# Patient Record
Sex: Female | Born: 1945 | Race: White | Hispanic: No | Smoking: Former smoker
Health system: Southern US, Community
[De-identification: ages and names within clinical notes are randomized; demographics above are authoritative.]

## PROBLEM LIST (undated history)

## (undated) DIAGNOSIS — R748 Abnormal levels of other serum enzymes: Secondary | ICD-10-CM

## (undated) DIAGNOSIS — Z5189 Encounter for other specified aftercare: Secondary | ICD-10-CM

## (undated) DIAGNOSIS — F329 Major depressive disorder, single episode, unspecified: Secondary | ICD-10-CM

## (undated) DIAGNOSIS — T7840XA Allergy, unspecified, initial encounter: Secondary | ICD-10-CM

## (undated) DIAGNOSIS — F32A Depression, unspecified: Secondary | ICD-10-CM

## (undated) DIAGNOSIS — I10 Essential (primary) hypertension: Secondary | ICD-10-CM

## (undated) DIAGNOSIS — G4730 Sleep apnea, unspecified: Secondary | ICD-10-CM

## (undated) DIAGNOSIS — K5790 Diverticulosis of intestine, part unspecified, without perforation or abscess without bleeding: Secondary | ICD-10-CM

## (undated) DIAGNOSIS — F1011 Alcohol abuse, in remission: Secondary | ICD-10-CM

## (undated) DIAGNOSIS — K219 Gastro-esophageal reflux disease without esophagitis: Secondary | ICD-10-CM

## (undated) DIAGNOSIS — E785 Hyperlipidemia, unspecified: Secondary | ICD-10-CM

## (undated) DIAGNOSIS — F1911 Other psychoactive substance abuse, in remission: Secondary | ICD-10-CM

## (undated) DIAGNOSIS — F319 Bipolar disorder, unspecified: Secondary | ICD-10-CM

## (undated) DIAGNOSIS — R0600 Dyspnea, unspecified: Secondary | ICD-10-CM

## (undated) DIAGNOSIS — K760 Fatty (change of) liver, not elsewhere classified: Secondary | ICD-10-CM

## (undated) DIAGNOSIS — G629 Polyneuropathy, unspecified: Secondary | ICD-10-CM

## (undated) HISTORY — DX: Diverticulosis of intestine, part unspecified, without perforation or abscess without bleeding: K57.90

## (undated) HISTORY — PX: SKIN GRAFT: SHX250

## (undated) HISTORY — DX: Hyperlipidemia, unspecified: E78.5

## (undated) HISTORY — DX: Dyspnea, unspecified: R06.00

## (undated) HISTORY — DX: Encounter for other specified aftercare: Z51.89

## (undated) HISTORY — DX: Fatty (change of) liver, not elsewhere classified: K76.0

## (undated) HISTORY — DX: Depression, unspecified: F32.A

## (undated) HISTORY — PX: TUBAL LIGATION: SHX77

## (undated) HISTORY — PX: OTHER SURGICAL HISTORY: SHX169

## (undated) HISTORY — DX: Major depressive disorder, single episode, unspecified: F32.9

## (undated) HISTORY — PX: BREAST LUMPECTOMY: SHX2

## (undated) HISTORY — DX: Other psychoactive substance abuse, in remission: F19.11

## (undated) HISTORY — PX: COLONOSCOPY: SHX174

## (undated) HISTORY — DX: Bipolar disorder, unspecified: F31.9

## (undated) HISTORY — DX: Abnormal levels of other serum enzymes: R74.8

## (undated) HISTORY — DX: Essential (primary) hypertension: I10

## (undated) HISTORY — DX: Alcohol abuse, in remission: F10.11

## (undated) HISTORY — DX: Gastro-esophageal reflux disease without esophagitis: K21.9

## (undated) HISTORY — DX: Sleep apnea, unspecified: G47.30

## (undated) HISTORY — DX: Allergy, unspecified, initial encounter: T78.40XA

## (undated) HISTORY — DX: Polyneuropathy, unspecified: G62.9

---

## 1998-04-28 ENCOUNTER — Encounter: Payer: Self-pay | Admitting: Family Medicine

## 1998-04-28 ENCOUNTER — Ambulatory Visit (HOSPITAL_COMMUNITY): Admission: RE | Admit: 1998-04-28 | Discharge: 1998-04-28 | Payer: Self-pay | Admitting: Family Medicine

## 1998-08-26 ENCOUNTER — Ambulatory Visit (HOSPITAL_COMMUNITY): Admission: RE | Admit: 1998-08-26 | Discharge: 1998-08-26 | Payer: Self-pay | Admitting: Gastroenterology

## 1998-12-08 ENCOUNTER — Inpatient Hospital Stay (HOSPITAL_COMMUNITY): Admission: EM | Admit: 1998-12-08 | Discharge: 1998-12-13 | Payer: Self-pay | Admitting: Emergency Medicine

## 1998-12-10 ENCOUNTER — Encounter: Payer: Self-pay | Admitting: Psychiatry

## 1998-12-14 ENCOUNTER — Other Ambulatory Visit: Admission: RE | Admit: 1998-12-14 | Discharge: 1998-12-29 | Payer: Self-pay | Admitting: Psychiatry

## 2003-05-21 ENCOUNTER — Ambulatory Visit (HOSPITAL_COMMUNITY): Admission: RE | Admit: 2003-05-21 | Discharge: 2003-05-21 | Payer: Self-pay | Admitting: Family Medicine

## 2003-07-18 ENCOUNTER — Ambulatory Visit (HOSPITAL_COMMUNITY): Admission: RE | Admit: 2003-07-18 | Discharge: 2003-07-18 | Payer: Self-pay | Admitting: Family Medicine

## 2006-09-21 ENCOUNTER — Ambulatory Visit: Payer: Self-pay | Admitting: Family Medicine

## 2006-09-28 ENCOUNTER — Ambulatory Visit (HOSPITAL_COMMUNITY): Admission: RE | Admit: 2006-09-28 | Discharge: 2006-09-28 | Payer: Self-pay | Admitting: Nurse Practitioner

## 2006-10-05 ENCOUNTER — Encounter: Admission: RE | Admit: 2006-10-05 | Discharge: 2006-10-05 | Payer: Self-pay | Admitting: Family Medicine

## 2006-10-19 ENCOUNTER — Ambulatory Visit: Payer: Self-pay | Admitting: Family Medicine

## 2006-10-19 ENCOUNTER — Encounter (INDEPENDENT_AMBULATORY_CARE_PROVIDER_SITE_OTHER): Payer: Self-pay | Admitting: Nurse Practitioner

## 2006-10-19 LAB — CONVERTED CEMR LAB
Cholesterol: 281 mg/dL — ABNORMAL HIGH (ref 0–200)
HDL: 61 mg/dL (ref >39)
LDL Cholesterol: 180 mg/dL — ABNORMAL HIGH (ref 0–99)
Total CHOL/HDL Ratio: 4.6
Triglycerides: 198 mg/dL — ABNORMAL HIGH (ref <150)
VLDL: 40 mg/dL (ref 0–40)

## 2007-04-03 ENCOUNTER — Ambulatory Visit: Payer: Self-pay | Admitting: Nurse Practitioner

## 2007-04-03 LAB — CONVERTED CEMR LAB
ALT: 23 units/L (ref 0–35)
AST: 21 units/L (ref 0–37)
Alkaline Phosphatase: 80 units/L (ref 39–117)
BUN: 18 mg/dL (ref 6–23)
Chloride: 99 meq/L (ref 96–112)
Creatinine, Ser: 0.92 mg/dL (ref 0.40–1.20)
HDL: 66 mg/dL (ref >39)
LDL Cholesterol: 175 mg/dL — ABNORMAL HIGH (ref 0–99)
Total Bilirubin: 0.5 mg/dL (ref 0.3–1.2)
Total CHOL/HDL Ratio: 4.1
VLDL: 28 mg/dL (ref 0–40)

## 2007-04-04 ENCOUNTER — Ambulatory Visit: Payer: Self-pay | Admitting: *Deleted

## 2007-04-24 ENCOUNTER — Ambulatory Visit: Payer: Self-pay | Admitting: Internal Medicine

## 2007-05-15 ENCOUNTER — Encounter (INDEPENDENT_AMBULATORY_CARE_PROVIDER_SITE_OTHER): Payer: Self-pay | Admitting: Family Medicine

## 2007-05-15 ENCOUNTER — Ambulatory Visit (HOSPITAL_COMMUNITY): Admission: RE | Admit: 2007-05-15 | Discharge: 2007-05-15 | Payer: Self-pay | Admitting: Family Medicine

## 2008-04-10 ENCOUNTER — Encounter: Payer: Self-pay | Admitting: Internal Medicine

## 2008-05-21 ENCOUNTER — Encounter: Payer: Self-pay | Admitting: Internal Medicine

## 2008-09-17 ENCOUNTER — Encounter: Payer: Self-pay | Admitting: Internal Medicine

## 2008-09-26 ENCOUNTER — Ambulatory Visit: Payer: Self-pay | Admitting: Pulmonary Disease

## 2008-09-26 ENCOUNTER — Encounter: Payer: Self-pay | Admitting: Internal Medicine

## 2008-09-26 DIAGNOSIS — R0602 Shortness of breath: Secondary | ICD-10-CM | POA: Insufficient documentation

## 2008-10-03 ENCOUNTER — Encounter: Payer: Self-pay | Admitting: Internal Medicine

## 2008-10-03 LAB — CONVERTED CEMR LAB: Pap Smear: NORMAL

## 2009-08-15 ENCOUNTER — Encounter: Payer: Self-pay | Admitting: Internal Medicine

## 2009-09-09 ENCOUNTER — Encounter: Payer: Self-pay | Admitting: Internal Medicine

## 2009-11-24 ENCOUNTER — Telehealth: Payer: Self-pay | Admitting: Internal Medicine

## 2009-11-24 ENCOUNTER — Ambulatory Visit: Payer: Self-pay | Admitting: Internal Medicine

## 2009-11-24 DIAGNOSIS — F329 Major depressive disorder, single episode, unspecified: Secondary | ICD-10-CM

## 2009-11-24 DIAGNOSIS — F32A Depression, unspecified: Secondary | ICD-10-CM | POA: Insufficient documentation

## 2009-11-24 DIAGNOSIS — I10 Essential (primary) hypertension: Secondary | ICD-10-CM | POA: Insufficient documentation

## 2009-11-24 DIAGNOSIS — F1910 Other psychoactive substance abuse, uncomplicated: Secondary | ICD-10-CM | POA: Insufficient documentation

## 2009-11-26 ENCOUNTER — Telehealth: Payer: Self-pay | Admitting: Family Medicine

## 2009-11-27 ENCOUNTER — Telehealth (INDEPENDENT_AMBULATORY_CARE_PROVIDER_SITE_OTHER): Payer: Self-pay | Admitting: *Deleted

## 2009-12-02 ENCOUNTER — Encounter: Payer: Self-pay | Admitting: Family Medicine

## 2009-12-06 ENCOUNTER — Telehealth: Payer: Self-pay | Admitting: Internal Medicine

## 2009-12-14 ENCOUNTER — Telehealth: Payer: Self-pay | Admitting: Internal Medicine

## 2009-12-22 ENCOUNTER — Ambulatory Visit: Payer: Self-pay | Admitting: Internal Medicine

## 2009-12-22 DIAGNOSIS — R748 Abnormal levels of other serum enzymes: Secondary | ICD-10-CM | POA: Insufficient documentation

## 2009-12-22 DIAGNOSIS — G4733 Obstructive sleep apnea (adult) (pediatric): Secondary | ICD-10-CM | POA: Insufficient documentation

## 2009-12-22 DIAGNOSIS — E785 Hyperlipidemia, unspecified: Secondary | ICD-10-CM | POA: Insufficient documentation

## 2009-12-24 ENCOUNTER — Encounter: Payer: Self-pay | Admitting: Internal Medicine

## 2010-02-11 ENCOUNTER — Ambulatory Visit: Payer: Self-pay | Admitting: Internal Medicine

## 2010-02-11 DIAGNOSIS — K219 Gastro-esophageal reflux disease without esophagitis: Secondary | ICD-10-CM | POA: Insufficient documentation

## 2010-02-11 DIAGNOSIS — R197 Diarrhea, unspecified: Secondary | ICD-10-CM | POA: Insufficient documentation

## 2010-02-12 ENCOUNTER — Encounter: Payer: Self-pay | Admitting: Gastroenterology

## 2010-02-15 ENCOUNTER — Telehealth (INDEPENDENT_AMBULATORY_CARE_PROVIDER_SITE_OTHER): Payer: Self-pay | Admitting: *Deleted

## 2010-02-15 LAB — CONVERTED CEMR LAB
ALT: 33 units/L (ref 0–35)
AST: 33 units/L (ref 0–37)
Albumin: 4.3 g/dL (ref 3.5–5.2)
Alkaline Phosphatase: 84 units/L (ref 39–117)
Basophils Relative: 0.3 % (ref 0.0–3.0)
Bilirubin, Direct: 0.1 mg/dL (ref 0.0–0.3)
CO2: 28 meq/L (ref 19–32)
Calcium: 9.9 mg/dL (ref 8.4–10.5)
Creatinine, Ser: 0.9 mg/dL (ref 0.4–1.2)
Eosinophils Absolute: 0.3 10*3/uL (ref 0.0–0.7)
Eosinophils Relative: 4.4 % (ref 0.0–5.0)
GFR calc non Af Amer: 67.99 mL/min (ref >60.00)
HCT: 40.3 % (ref 36.0–46.0)
HDL: 53.4 mg/dL (ref >39.00)
Lymphs Abs: 3.1 10*3/uL (ref 0.7–4.0)
MCHC: 33.5 g/dL (ref 30.0–36.0)
MCV: 85.1 fL (ref 78.0–100.0)
Monocytes Absolute: 0.4 10*3/uL (ref 0.1–1.0)
RBC: 4.73 M/uL (ref 3.87–5.11)
Sodium: 141 meq/L (ref 135–145)
Total Protein: 7.4 g/dL (ref 6.0–8.3)
Triglycerides: 140 mg/dL (ref 0.0–149.0)
WBC: 7.4 10*3/uL (ref 4.5–10.5)

## 2010-02-16 ENCOUNTER — Encounter (INDEPENDENT_AMBULATORY_CARE_PROVIDER_SITE_OTHER): Payer: Self-pay | Admitting: *Deleted

## 2010-02-16 ENCOUNTER — Telehealth: Payer: Self-pay | Admitting: Internal Medicine

## 2010-02-16 ENCOUNTER — Encounter: Payer: Self-pay | Admitting: Gastroenterology

## 2010-02-16 ENCOUNTER — Ambulatory Visit: Payer: Self-pay | Admitting: Gastroenterology

## 2010-02-16 DIAGNOSIS — K602 Anal fissure, unspecified: Secondary | ICD-10-CM | POA: Insufficient documentation

## 2010-02-16 DIAGNOSIS — K589 Irritable bowel syndrome without diarrhea: Secondary | ICD-10-CM | POA: Insufficient documentation

## 2010-02-16 DIAGNOSIS — L290 Pruritus ani: Secondary | ICD-10-CM | POA: Insufficient documentation

## 2010-02-16 DIAGNOSIS — K5900 Constipation, unspecified: Secondary | ICD-10-CM | POA: Insufficient documentation

## 2010-02-16 DIAGNOSIS — R109 Unspecified abdominal pain: Secondary | ICD-10-CM | POA: Insufficient documentation

## 2010-02-16 DIAGNOSIS — F1011 Alcohol abuse, in remission: Secondary | ICD-10-CM | POA: Insufficient documentation

## 2010-02-18 ENCOUNTER — Ambulatory Visit (HOSPITAL_COMMUNITY)
Admission: RE | Admit: 2010-02-18 | Discharge: 2010-02-18 | Payer: Self-pay | Source: Home / Self Care | Attending: Gastroenterology | Admitting: Gastroenterology

## 2010-02-18 DIAGNOSIS — K8020 Calculus of gallbladder without cholecystitis without obstruction: Secondary | ICD-10-CM | POA: Insufficient documentation

## 2010-02-18 DIAGNOSIS — K7689 Other specified diseases of liver: Secondary | ICD-10-CM | POA: Insufficient documentation

## 2010-02-23 ENCOUNTER — Telehealth: Payer: Self-pay | Admitting: Gastroenterology

## 2010-03-19 ENCOUNTER — Telehealth: Payer: Self-pay | Admitting: Internal Medicine

## 2010-03-26 ENCOUNTER — Telehealth: Payer: Self-pay | Admitting: Gastroenterology

## 2010-03-28 ENCOUNTER — Encounter: Payer: Self-pay | Admitting: Family Medicine

## 2010-04-04 LAB — CONVERTED CEMR LAB
ALT: 61 units/L
AST: 43 units/L
HDL: 53 mg/dL
Sodium, serum: 139 mmol/L
WBC, blood: 8.3 10*3/uL

## 2010-04-06 NOTE — Miscellaneous (Signed)
Summary: labs from [facility]  Clinical Lists Changes  Observations: Added new observation of PAP SMEAR: normal (10/03/2008 14:54) Added new observation of LDL: 204 mg/dL (04/54/0981 19:14) Added new observation of HDL: 53 mg/dL (78/29/5621 30:86) Added new observation of SGPT (ALT): 61 units/L (09/26/2008 14:49) Added new observation of SGOT (AST): 43 units/L (09/26/2008 14:49) Added new observation of CREATININE: 1.00 mg/dL (57/84/6962 95:28) Added new observation of POTASSIUM: 3.8 mmol/L (09/26/2008 14:49) Added new observation of SODIUM: 139 mmol/L (09/26/2008 14:49) Added new observation of PLATELETS: 312 10*3/mm3 (05/21/2008 14:49) Added new observation of HGB: 13.3 g/dL (41/32/4401 02:72) Added new observation of WBC: 8.3 10*3/mm3 (05/21/2008 14:49)      Preventive Care Screening  Pap Smear:    Date:  10/03/2008    Results:  normal

## 2010-04-06 NOTE — Progress Notes (Signed)
Summary: checking on patient  Phone Note Outgoing Call   Summary of Call:  placed check on the patient --did she  stay on Celexa or switched  to Lexapro? --does  she has an appointment to see psychiatry? Jose E. Paz MD  December 06, 2009 11:55 AM   Follow-up for Phone Call        Pt is taking is Lexapro. She has not made an appt with psych. She will make one ASAP. But she states she is doing well.  Follow-up by: Army Fossa CMA,  December 07, 2009 9:08 AM

## 2010-04-06 NOTE — Progress Notes (Signed)
Summary: prior [facility]  Phone Note Refill Request Message from:  Fax from Pharmacy on November 27, 2009 10:07 AM  Refills Requested: Medication #1:  LEXAPRO 10 MG TABS 1 by mouth daily.. prior Berkley Harvey - [id number] - per ins use citalopram - fluvoxamine  Initial call taken by: Okey Regal [place],  November 27, 2009 10:10 AM  Follow-up for Phone Call        Can we change to one of the preferred meds listed or try prior authorization anyway? Please advise.  Follow-up by: Lucious Groves CMA,  November 27, 2009 10:59 AM  Additional Follow-up for Phone Call Additional follow up Details #1::        we can change to celexa but only if pt is ok with it---ask pt first Additional Follow-up by: Loreen Freud DO,  November 27, 2009 11:11 AM    Additional Follow-up for Phone Call Additional follow up Details #2::    Left message on machine to call back to office. Lucious Groves CMA  November 27, 2009 12:17 PM   PATIENT CALLED BACK--SHE HAS BEEN TAKING CELEXA DUE TO THE COST OF LEXAPRO, BUT SHE SAYS DR PAZ WANTED HER TO TAKE LEXAPRO  SHE CAN NOW AFFORD THE LEXAPRO---SO SHE WANTS TO STOP THE CELEXA AND START TAKING LEXAPRO.Marland KitchenMarland KitchenJerolyn Shin  November 27, 2009 2:26 PM  per dr Drue Novel phone note on 11-24-09 pt is on citalopram. No change for now. However med was changed per phone 11-26-09 by dr Beverely Low although reading new to est OV lexapro was added to med list but later verify that it needed to be celexa per previous rx. left message to call office to inform pt................Marland KitchenFelecia Deloach CMA  November 27, 2009 2:53 PM  Pt return call left message to call office .............Marland KitchenFelecia Deloach CMA  December 02, 2009 10:29 AM   pt called back stating that celexa was working for her but it is no longer helping her pt would like to change to lexapro. Pt has tried to follow meds prozac, paxil, celexa. prior auth in process CASE #[id number]...........Marland KitchenFelecia Deloach CMA  December 02, 2009 10:35 AM     Additional Follow-up for Phone Call Additional follow up Details #3:: Details for Additional Follow-up Action Taken: prior auth approved 11-11-2009 until 03-06-2098, pt notified, approval letter scan to chart, faxed [facility] [address] [place].................Marland KitchenFelecia Deloach CMA  December 03, 2009 8:12 AM

## 2010-04-06 NOTE — Miscellaneous (Signed)
Summary: Vaccine Records  Vaccine Records   Imported By: Lanelle Bal 12/25/2009 10:58:47  _____________________________________________________________________  External Attachment:    Type:   Image     Comment:   External Document  Appended Document: Vaccine Records enter  tetanus shot in EMR  Appended Document: Immunization Entry     Immunization History:  Tetanus/Td Immunization History:    Tetanus/Td:  historical (11/10/2009)

## 2010-04-06 NOTE — Letter (Signed)
Summary: Generic Letter  [facility] [place]  [address] [place], [place] [address]   Phone: [phone]  Fax: [phone]    12/22/2009  REF: Tammy Stevens [address] [place], [place]  [address]    To Whom It May Concern    [name]  is a patient of mine, she has a history of sleep apnea and is benefit from using a CPAP.    Sincerely,      [name] MD

## 2010-04-06 NOTE — Progress Notes (Signed)
Summary: depression med (lmom 10/11)   Phone Note Call from Patient Call back at [facility] [place] [facility] Phone [phone]   Summary of Call: Patient notes that she has been taking the Lexapro 2 by mouth once daily and is still very depressed. She would like to know if she should add Citalopram 40mg  that she has at home or do you have other advisements, please advise. Initial call taken by: Lucious Groves CMA,  December 14, 2009 4:36 PM  Follow-up for Phone Call        she needs to stay on Lexapro and see psychiatry ASAP. If suicidal ideas, go to the ER Follow-up by: Jose E. Paz MD,  December 14, 2009 5:03 PM  Additional Follow-up for Phone Call Additional follow up Details #1::        Patient notified and states that she does not have the company-pay to see psychiatry right now. She would like to know why you want her to see them and what is expected to be accomplished. Please advise. Additional Follow-up by: Lucious Groves CMA,  December 15, 2009 2:07 PM    Additional Follow-up for Phone Call Additional follow up Details #2::    patient reportedly has a long  history of psychiatric issues, the goals are for her depression to be well controlled and to that end, she really needs to see a specialist. Jose E. Paz MD  December 15, 2009 2:28 PM   Additional Follow-up for Phone Call Additional follow up Details #3:: Details for Additional Follow-up Action Taken: Left message for pt to call back. Army Fossa CMA  December 15, 2009 3:56 PM  Patient notified. Lucious Groves CMA  December 16, 2009 10:50 AM    New/Updated Medications: LEXAPRO 20 MG TABS (ESCITALOPRAM OXALATE) 1 by mouth qd Prescriptions: LEXAPRO 20 MG TABS (ESCITALOPRAM OXALATE) 1 by mouth qd  #30 x 1   Entered by:   Lucious Groves CMA   Authorized by:   Nolon Rod. Paz MD   Signed by:   Lucious Groves CMA on 12/16/2009   Method used:   Electronically to        [facility] [address] (retail)       [phone]       [place], [place]  [address]       Ph: [phone]       Fax: [phone]   RxID:   [id number]
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
## 2010-04-06 NOTE — Medication Information (Signed)
Summary: Prior Authorization & Approval for Lexapro/[facility]  Prior Authorization & Approval for Lexapro/[facility]   Imported By: Lanelle Bal 12/09/2009 08:09:34  _____________________________________________________________________  External Attachment:    Type:   Image     Comment:   External Document

## 2010-04-06 NOTE — Letter (Signed)
Summary: New Patient letter  [facility]  [address] [place], [place] [address]   Phone: [phone]  Fax: [phone]       02/12/2010 MRN: [id number]  [facility] [address] [place], [place]  [address]  Dear Ms. Morea,  Welcome to the Gastroenterology Division at [facility].    You are scheduled to see Dr.  Jarold Motto   on 02-16-10 at 10am on the 3rd floor at [facility], [address]  We ask that you try to arrive at our office 15 minutes prior to your appointment time to allow for check-in.  We would like you to complete the enclosed self-administered evaluation form prior to your visit and bring it with you on the day of your appointment.  We will review it with you.  Also, please bring a complete list of all your medications or, if you prefer, bring the medication bottles and we will list them.  Please bring your insurance card so that we may make a copy of it.  If your insurance requires a referral to see a specialist, please bring your referral form from your primary care physician.  Co-payments are due at the time of your visit and may be paid by cash, check or credit card.     Your office visit will consist of a consult with your physician (includes a physical exam), any laboratory testing he/she may order, scheduling of any necessary diagnostic testing (e.g. x-ray, ultrasound, CT-scan), and scheduling of a procedure (e.g. Endoscopy, Colonoscopy) if required.  Please allow enough time on your schedule to allow for any/all of these possibilities.    If you cannot keep your appointment, please call [phone] to cancel or reschedule prior to your appointment date.  This allows [place] the opportunity to schedule an appointment for another patient in need of care.  If you do not cancel or reschedule by 5 p.m. the business day prior to your appointment date, you will be charged a $50.00 late cancellation/no-show fee.    Thank you for choosing  [facility] for your medical needs.  We appreciate the opportunity to care for you.  Please visit [place] at our website  to learn more about our practice.                     Sincerely,                                                             The Gastroenterology Division

## 2010-04-06 NOTE — Letter (Signed)
Summary: [facility]    Imported By: Lanelle Bal 12/29/2009 12:55:23  _____________________________________________________________________  External Attachment:    Type:   Image     Comment:   External Document
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
## 2010-04-06 NOTE — Progress Notes (Signed)
Summary: Med Change?  Phone Note Call from Patient   Caller: Patient Summary of Call: Pt called stating that she is currently on Celaxa 40 mg. She states that Dr.Paz wants her to switch to Lexapro. I reviewed pts OV and do not see anything noted about switching to Lexapro. The last phone states Celexa was verified and there should be no change for now. Please advise on if she can switch meds. I informed pt that Dr.Paz was currently out of the office but if possible pt would like an answer now. Army Fossa CMA  November 26, 2009 11:31 AM   Follow-up for Phone Call        lexapro and celexa are related medications- lexapro is not generic and will be considerably more expensive.  i see in pt's OV she was previously on Lexapro 10mg .  ok to switch if she would like, #30, 1 refill. Follow-up by: Neena Rhymes MD,  November 26, 2009 11:41 AM  Additional Follow-up for Phone Call Additional follow up Details #1::        Spoke with pt she would like to change to Lexapro. Army Fossa CMA  November 26, 2009 12:46 PM     New/Updated Medications: LEXAPRO 10 MG TABS (ESCITALOPRAM OXALATE) 1 by mouth daily. Prescriptions: LEXAPRO 10 MG TABS (ESCITALOPRAM OXALATE) 1 by mouth daily.  #30 x 1   Entered by:   Army Fossa CMA   Authorized by:   Neena Rhymes MD   Signed by:   Army Fossa CMA on 11/26/2009   Method used:   Electronically to        [facility] [address] (retail)       [phone]       [place], [place]  [address]       Ph: [phone]       Fax: [phone]   RxID:   [id number]

## 2010-04-06 NOTE — Progress Notes (Signed)
  Phone Note Outgoing Call   Summary of Call: advise patient, we clarified her medication, she is on citalopram. No change for now [place] E. Desarae Placide MD  November 24, 2009 4:39 PM   Follow-up for Phone Call        Pt is aware. Army Fossa CMA  November 24, 2009 4:52 PM

## 2010-04-07 ENCOUNTER — Encounter: Payer: Self-pay | Admitting: Gastroenterology

## 2010-04-07 ENCOUNTER — Ambulatory Visit: Admit: 2010-04-07 | Payer: Self-pay | Admitting: Gastroenterology

## 2010-04-08 NOTE — Letter (Signed)
Summary: [facility] Instructions  [facility]  [address] [place], [place] [address]   Phone: [phone]  Fax: [phone]       TABITHIA STRODER    1945/03/08    MRN: [id number]        Procedure Day /Date: Monday 03/15/2010     Arrival Time: 2:30pm     Procedure Time: 3:30pm     Location of Procedure:                    X  [facility] (4th Floor)                  PREPARATION FOR COLONOSCOPY WITH MOVIPREP   Starting 5 days prior to your procedure 03/10/2010 do not eat nuts, seeds, popcorn, corn, beans, peas,  salads, or any raw vegetables.  Do not take any fiber supplements (e.g. Metamucil, Citrucel, and Benefiber).  THE DAY BEFORE YOUR PROCEDURE         Sunday 03/14/2010  1.  Drink clear liquids the entire day-NO SOLID FOOD  2.  Do not drink anything colored red or purple.  Avoid juices with pulp.  No orange juice.  3.  Drink at least 64 oz. (8 glasses) of fluid/clear liquids during the day to prevent dehydration and help the prep work efficiently.  CLEAR LIQUIDS INCLUDE: Water Jello Ice Popsicles Tea (sugar ok, no milk/cream) Powdered fruit flavored drinks Coffee (sugar ok, no milk/cream) Gatorade Juice: apple, white grape, white cranberry  Lemonade Clear bullion, consomm, broth Carbonated beverages (any kind) Strained chicken noodle soup Hard Candy                             4.  In the morning, mix first dose of MoviPrep solution:    Empty 1 Pouch A and 1 Pouch B into the disposable container    Add lukewarm drinking water to the top line of the container. Mix to dissolve    Refrigerate (mixed solution should be used within 24 hrs)  5.  Begin drinking the prep at 5:00 p.m. The MoviPrep container is divided by 4 marks.   Every 15 minutes drink the solution down to the next mark (approximately 8 oz) until the full liter is complete.   6.  Follow completed prep with 16 oz of clear liquid of your choice (Nothing red or purple).   Continue to drink clear liquids until bedtime.  7.  Before going to bed, mix second dose of MoviPrep solution:    Empty 1 Pouch A and 1 Pouch B into the disposable container    Add lukewarm drinking water to the top line of the container. Mix to dissolve    Refrigerate  THE DAY OF YOUR PROCEDURE      Monday 03/15/2010  Beginning at 10:30am (5 hours before procedure):         1. Every 15 minutes, drink the solution down to the next mark (approx 8 oz) until the full liter is complete.  2. Follow completed prep with 16 oz. of clear liquid of your choice.    3. You may drink clear liquids until 1:30pm (2 HOURS BEFORE PROCEDURE).   MEDICATION INSTRUCTIONS  Unless otherwise instructed, you should take regular prescription medications with a small sip of water   as early as possible the morning of your procedure.         OTHER INSTRUCTIONS  You will need  a responsible adult at least 64 years of age to accompany you and drive you home.   This person must remain in the waiting room during your procedure.  Wear loose fitting clothing that is easily removed.  Leave jewelry and other valuables at home.  However, you may wish to bring a book to read or  an iPod/MP3 player to listen to music as you wait for your procedure to start.  Remove all body piercing jewelry and leave at home.  Total time from sign-in until discharge is approximately 2-3 hours.  You should go home directly after your procedure and rest.  You can resume normal activities the  day after your procedure.  The day of your procedure you should not:   Drive   Make legal decisions   Operate machinery   Drink alcohol   Return to work  You will receive specific instructions about eating, activities and medications before you leave.    The above instructions have been reviewed and explained to me by   _______________________    I fully understand and can verbalize these instructions  _____________________________ Date _________

## 2010-04-08 NOTE — Progress Notes (Signed)
Summary: Refill--[facility]  Phone Note Refill Request   Refills Requested: Medication #1:  HYDROCHLOROTHIAZIDE 12.5 MG CAPS 1 by mouth daily. Patient notes that she now would like this prescription from [facility] since he has taken over as her PCP.  Initial call taken by: Lucious Groves CMA,  March 19, 2010 10:51 AM    Prescriptions: HYDROCHLOROTHIAZIDE 12.5 MG CAPS (HYDROCHLOROTHIAZIDE) 1 by mouth daily.  #90 x 1   Entered by:   Lucious Groves CMA   Authorized by:   Nolon Rod. Paz MD   Signed by:   Lucious Groves CMA on 03/19/2010   Method used:   Electronically to        [facility] [address] (retail)       [phone]       [place], [place]  [address]       Ph: [phone]       Fax: [phone]   RxID:   [id number]

## 2010-04-08 NOTE — Progress Notes (Signed)
Summary: prep ?s  Phone Note Call from Patient Call back at Work Phone [phone]   Caller: Patient Call For: Dr. Jarold Motto Reason for Call: Talk to Nurse Summary of Call: pt needs to speak with a nurse about her prep instruction Initial call taken by: [place] Johnson,  March 26, 2010 8:13 AM  Follow-up for Phone Call        went over instructions with the patient and she veralized understanding. Follow-up by: Harlow Mares CMA Duncan Dull),  March 26, 2010 8:33 AM
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
## 2010-04-08 NOTE — Progress Notes (Signed)
Summary: Gluten Allergy  Phone Note Outgoing Call   Call placed by: Lamona Curl CMA Duncan Dull),  February 23, 2010 3:07 PM Call placed to: Patient Summary of Call: Per Dr Jarold Motto, patient is borderline for wheat allergy (see labs from 02/16/10). He would like her to try a gluten free diet for several weeks. I have sent her a diet in the mail and have asked her to call us back with an update in several weeks time to see how she is doing. She verbalizes understanding of all of the above. Initial call taken by: Lamona Curl CMA Duncan Dull),  February 23, 2010 3:08 PM

## 2010-04-08 NOTE — Progress Notes (Signed)
Summary: psych numbers  Phone Note Call from Patient Call back at Home Phone [phone]   Caller: Patient Summary of Call: Pt called this am and wanted to know the numbers to a psych. She is going to call and make an appt. I gave pt numbers to ones around the community. (the ones that were in psych referral) Initial call taken by: Army Fossa CMA,  February 15, 2010 8:10 AM

## 2010-04-08 NOTE — Progress Notes (Signed)
Summary: Gluten allergy testing  Phone Note Outgoing Call   Summary of Call: Returned call to patient, I do not see where she has been tested for gluten allergy.  Patient notes that is fine, she had done earlier today. Lucious Groves CMA  February 16, 2010 12:09 PM

## 2010-04-16 ENCOUNTER — Ambulatory Visit: Payer: BC Managed Care – PPO | Admitting: Internal Medicine

## 2010-04-27 ENCOUNTER — Encounter: Payer: Self-pay | Admitting: Internal Medicine

## 2010-04-27 ENCOUNTER — Other Ambulatory Visit: Payer: Self-pay | Admitting: Internal Medicine

## 2010-04-27 ENCOUNTER — Ambulatory Visit (INDEPENDENT_AMBULATORY_CARE_PROVIDER_SITE_OTHER): Payer: BC Managed Care – PPO | Admitting: Internal Medicine

## 2010-04-27 DIAGNOSIS — R509 Fever, unspecified: Secondary | ICD-10-CM

## 2010-04-27 DIAGNOSIS — K5900 Constipation, unspecified: Secondary | ICD-10-CM

## 2010-04-27 DIAGNOSIS — F329 Major depressive disorder, single episode, unspecified: Secondary | ICD-10-CM

## 2010-04-27 LAB — SEDIMENTATION RATE: Sed Rate: 21 mm/hr (ref 0–22)

## 2010-04-27 LAB — CONVERTED CEMR LAB
Glucose, Urine, Semiquant: NEGATIVE
Protein, U semiquant: NEGATIVE
WBC Urine, dipstick: NEGATIVE
pH: 6

## 2010-04-27 LAB — CBC WITH DIFFERENTIAL/PLATELET
Basophils Absolute: 0.1 10*3/uL (ref 0.0–0.1)
Basophils Relative: 0.8 % (ref 0.0–3.0)
Eosinophils Absolute: 0.3 10*3/uL (ref 0.0–0.7)
MCHC: 33.2 g/dL (ref 30.0–36.0)
MCV: 84.2 fl (ref 78.0–100.0)
Monocytes Absolute: 0.8 10*3/uL (ref 0.1–1.0)
Neutrophils Relative %: 51.6 % (ref 43.0–77.0)
Platelets: 394 10*3/uL (ref 150.0–400.0)
RDW: 15.7 % — ABNORMAL HIGH (ref 11.5–14.6)
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
## 2010-05-05 ENCOUNTER — Encounter: Payer: Self-pay | Admitting: Gastroenterology

## 2010-09-07 ENCOUNTER — Encounter: Payer: Self-pay | Admitting: Internal Medicine

## 2010-09-07 ENCOUNTER — Ambulatory Visit (INDEPENDENT_AMBULATORY_CARE_PROVIDER_SITE_OTHER): Payer: BC Managed Care – PPO | Admitting: Internal Medicine

## 2010-09-07 VITALS — BP 132/80 | HR 88 | Temp 100.5°F | Wt 179.6 lb

## 2010-09-07 DIAGNOSIS — R059 Cough, unspecified: Secondary | ICD-10-CM

## 2010-09-07 DIAGNOSIS — R05 Cough: Secondary | ICD-10-CM

## 2010-09-07 DIAGNOSIS — J410 Simple chronic bronchitis: Secondary | ICD-10-CM | POA: Insufficient documentation

## 2010-09-07 MED ORDER — DOXYCYCLINE HYCLATE 100 MG PO TABS: 100.0000 mg | ORAL_TABLET | Freq: Two times a day (BID) | ORAL | Status: AC

## 2010-09-07 MED ORDER — BENZONATATE 100 MG PO CAPS: 100.0000 mg | ORAL_CAPSULE | Freq: Three times a day (TID) | ORAL | Status: AC | PRN

## 2010-09-07 NOTE — Patient Instructions (Addendum)
Rest, fluids , tylenol For cough, take Mucinex DM twice a day as needed  If the cough continued, and use Tessalon Perles Take the antibiotic as prescribed.... doxycycline  Call if no better in few days Call anytime if the symptoms are severe, you have high fever, short of breath

## 2010-09-07 NOTE — Assessment & Plan Note (Signed)
3 days history of persistent cough, some fever today, no evidence of acute sinusitis or pneumonia on clinical grounds. Likely she has a viral infection and/or bronchitis. She has a history of substance abuse consequently I will stay away from narcotics for cough suppression Plan: See instructions
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
## 2010-09-15 ENCOUNTER — Other Ambulatory Visit: Payer: Self-pay | Admitting: Internal Medicine

## 2010-09-22 ENCOUNTER — Telehealth: Payer: Self-pay | Admitting: Internal Medicine

## 2010-09-22 NOTE — Telephone Encounter (Signed)
error 

## 2010-09-24 ENCOUNTER — Ambulatory Visit: Payer: BC Managed Care – PPO | Admitting: Internal Medicine

## 2010-10-06 ENCOUNTER — Telehealth: Payer: Self-pay | Admitting: Internal Medicine

## 2010-10-06 NOTE — Telephone Encounter (Signed)
Message copied by Merrie Roof on Wed Oct 06, 2010  9:11 AM ------      Message from: [name] E      Created: Thu Sep 23, 2010  1:37 PM       Ok with me, I don't know of any urgency            ----- Message -----         From: Merrie Roof         Sent: 09/22/2010   5:01 PM           To: Wanda Plump, MD, Merrie Roof            Patient is scheduled to come in for a Pap and Breast exam on       Friday 09/24/2010---had CPX in late 2011 (Sept or Dec)---would like this visit only for this exam with a female doctor.      Is this OK with you for me to contact Dr Laury Axon or Dr Beverely Low??     I explained to the patient that it will be in a few weeks for a rescheduled appointment

## 2010-10-06 NOTE — Telephone Encounter (Signed)
Patient made appt with Dr Beverely Low for Tues 9/18 at 8:30 ---she knows it is for Pap only

## 2010-11-17 ENCOUNTER — Ambulatory Visit: Payer: BC Managed Care – PPO | Admitting: Family Medicine

## 2010-11-23 ENCOUNTER — Ambulatory Visit (INDEPENDENT_AMBULATORY_CARE_PROVIDER_SITE_OTHER): Payer: BC Managed Care – PPO | Admitting: Family Medicine

## 2010-11-23 ENCOUNTER — Other Ambulatory Visit (HOSPITAL_COMMUNITY)
Admission: RE | Admit: 2010-11-23 | Discharge: 2010-11-23 | Disposition: A | Discharge status: Home / Self Care | Payer: BC Managed Care – PPO | Source: Ambulatory Visit | Attending: Family Medicine | Admitting: Family Medicine

## 2010-11-23 VITALS — BP 118/80 | Temp 99.0°F | Ht 66.0 in | Wt 171.8 lb

## 2010-11-23 DIAGNOSIS — Z01419 Encounter for gynecological examination (general) (routine) without abnormal findings: Secondary | ICD-10-CM | POA: Insufficient documentation

## 2010-11-23 DIAGNOSIS — Z1231 Encounter for screening mammogram for malignant neoplasm of breast: Secondary | ICD-10-CM

## 2010-11-23 DIAGNOSIS — Z124 Encounter for screening for malignant neoplasm of cervix: Secondary | ICD-10-CM

## 2010-11-25 NOTE — Progress Notes (Signed)
Quick Note:    Results mailed.  ______

## 2010-11-30 ENCOUNTER — Encounter: Payer: Self-pay | Admitting: Family Medicine

## 2010-11-30 DIAGNOSIS — Z124 Encounter for screening for malignant neoplasm of cervix: Secondary | ICD-10-CM | POA: Insufficient documentation

## 2010-11-30 DIAGNOSIS — Z01419 Encounter for gynecological examination (general) (routine) without abnormal findings: Secondary | ICD-10-CM | POA: Insufficient documentation

## 2010-11-30 NOTE — Assessment & Plan Note (Signed)
Pap collected. 

## 2010-11-30 NOTE — Progress Notes (Signed)
  Subjective:    Patient ID: Tammy Stevens, female    DOB: 13-Oct-1945, 64 y.o.   MRN: [id number]  HPI Here today for pap and breast exam.  No GYN concerns.  Overdue on mammo.   Review of Systems For ROS see HPI     Objective:   Physical Exam  Vitals reviewed. Pulmonary/Chest: Right breast exhibits no inverted nipple, no mass, no nipple discharge, no skin change and no tenderness. Left breast exhibits no inverted nipple, no mass, no nipple discharge, no skin change and no tenderness.  Genitourinary: Rectum normal. No breast swelling, tenderness, discharge or bleeding. No labial fusion. There is no rash, tenderness, lesion or injury on the right labia. There is no rash, tenderness, lesion or injury on the left labia. Uterus is not deviated, not enlarged, not fixed and not tender. Cervix exhibits no motion tenderness, no discharge and no friability. Right adnexum displays no mass, no tenderness and no fullness. Left adnexum displays no mass, no tenderness and no fullness. No erythema, tenderness or bleeding around the vagina. No signs of injury around the vagina. No vaginal discharge found.          Assessment & Plan:

## 2010-11-30 NOTE — Assessment & Plan Note (Signed)
Pt's breast exam and vaginal exam both normal.

## 2010-12-22 ENCOUNTER — Ambulatory Visit
Admission: RE | Admit: 2010-12-22 | Discharge: 2010-12-22 | Disposition: A | Discharge status: Home / Self Care | Payer: BC Managed Care – PPO | Source: Ambulatory Visit | Attending: Family Medicine | Admitting: Family Medicine

## 2010-12-22 DIAGNOSIS — Z1231 Encounter for screening mammogram for malignant neoplasm of breast: Secondary | ICD-10-CM

## 2011-01-07 ENCOUNTER — Ambulatory Visit (INDEPENDENT_AMBULATORY_CARE_PROVIDER_SITE_OTHER): Payer: BC Managed Care – PPO | Admitting: Internal Medicine

## 2011-01-07 ENCOUNTER — Encounter: Payer: Self-pay | Admitting: Internal Medicine

## 2011-01-07 VITALS — BP 132/68 | HR 65 | Temp 98.7°F | Resp 18 | Ht 67.0 in | Wt 172.2 lb

## 2011-01-07 DIAGNOSIS — R51 Headache: Secondary | ICD-10-CM

## 2011-01-07 DIAGNOSIS — R059 Cough, unspecified: Secondary | ICD-10-CM

## 2011-01-07 DIAGNOSIS — R05 Cough: Secondary | ICD-10-CM

## 2011-01-07 DIAGNOSIS — Z0000 Encounter for general adult medical examination without abnormal findings: Secondary | ICD-10-CM | POA: Insufficient documentation

## 2011-01-07 DIAGNOSIS — J329 Chronic sinusitis, unspecified: Secondary | ICD-10-CM

## 2011-01-07 MED ORDER — AMOXICILLIN 500 MG PO CAPS: 1000.0000 mg | ORAL_CAPSULE | Freq: Two times a day (BID) | ORAL | Status: AC

## 2011-01-07 MED ORDER — FLUTICASONE PROPIONATE 50 MCG/ACT NA SUSP: 2.0000 | Freq: Every day | NASAL | Status: DC

## 2011-01-07 NOTE — Assessment & Plan Note (Signed)
Recommend to have a physical exam, she states "I  do my physicals with my gynecologist"
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
## 2011-01-07 NOTE — Patient Instructions (Signed)
Rest, fluids , tylenol If  Cough or nasal discharge  take Mucinex DM twice a day as needed  flonase as prescribed  Take the antibiotic as prescribed ----> Amoxicillin Call if no better in few days Call anytime if the symptoms are severe

## 2011-01-20 ENCOUNTER — Telehealth: Payer: Self-pay | Admitting: Internal Medicine

## 2011-01-25 NOTE — Telephone Encounter (Signed)
error 

## 2011-03-19 ENCOUNTER — Other Ambulatory Visit: Payer: Self-pay | Admitting: Internal Medicine

## 2011-06-26 ENCOUNTER — Other Ambulatory Visit: Payer: Self-pay | Admitting: Internal Medicine

## 2011-06-27 NOTE — Telephone Encounter (Signed)
Refill done.  

## 2011-11-05 ENCOUNTER — Emergency Department (HOSPITAL_COMMUNITY)
Admission: EM | Admit: 2011-11-05 | Discharge: 2011-11-06 | Disposition: A | Discharge status: Home / Self Care | Payer: Medicare Other | Attending: Emergency Medicine | Admitting: Emergency Medicine

## 2011-11-05 ENCOUNTER — Encounter (HOSPITAL_COMMUNITY): Payer: Self-pay | Admitting: *Deleted

## 2011-11-05 ENCOUNTER — Ambulatory Visit (INDEPENDENT_AMBULATORY_CARE_PROVIDER_SITE_OTHER): Payer: Medicare Other | Admitting: Emergency Medicine

## 2011-11-05 VITALS — BP 164/80 | HR 73 | Temp 98.1°F | Resp 18 | Ht 66.0 in | Wt 170.0 lb

## 2011-11-05 DIAGNOSIS — F4310 Post-traumatic stress disorder, unspecified: Secondary | ICD-10-CM

## 2011-11-05 DIAGNOSIS — F32A Depression, unspecified: Secondary | ICD-10-CM

## 2011-11-05 DIAGNOSIS — G4730 Sleep apnea, unspecified: Secondary | ICD-10-CM | POA: Insufficient documentation

## 2011-11-05 DIAGNOSIS — E785 Hyperlipidemia, unspecified: Secondary | ICD-10-CM | POA: Insufficient documentation

## 2011-11-05 DIAGNOSIS — F329 Major depressive disorder, single episode, unspecified: Secondary | ICD-10-CM

## 2011-11-05 DIAGNOSIS — F411 Generalized anxiety disorder: Secondary | ICD-10-CM | POA: Insufficient documentation

## 2011-11-05 DIAGNOSIS — R5383 Other fatigue: Secondary | ICD-10-CM | POA: Insufficient documentation

## 2011-11-05 DIAGNOSIS — F319 Bipolar disorder, unspecified: Secondary | ICD-10-CM | POA: Insufficient documentation

## 2011-11-05 DIAGNOSIS — I10 Essential (primary) hypertension: Secondary | ICD-10-CM | POA: Insufficient documentation

## 2011-11-05 DIAGNOSIS — R5381 Other malaise: Secondary | ICD-10-CM | POA: Insufficient documentation

## 2011-11-05 DIAGNOSIS — R4182 Altered mental status, unspecified: Secondary | ICD-10-CM

## 2011-11-05 LAB — COMPREHENSIVE METABOLIC PANEL
ALT: 21 U/L (ref 0–35)
Albumin: 3.9 g/dL (ref 3.5–5.2)
Alkaline Phosphatase: 113 U/L (ref 39–117)
Calcium: 10.1 mg/dL (ref 8.4–10.5)
GFR calc Af Amer: 77 mL/min — ABNORMAL LOW (ref >90)
Glucose, Bld: 107 mg/dL — ABNORMAL HIGH (ref 70–99)
Potassium: 3.9 mEq/L (ref 3.5–5.1)
Sodium: 141 mEq/L (ref 135–145)
Total Protein: 7.2 g/dL (ref 6.0–8.3)

## 2011-11-05 LAB — RAPID URINE DRUG SCREEN, HOSP PERFORMED
Barbiturates: NOT DETECTED
Benzodiazepines: NOT DETECTED
Cocaine: NOT DETECTED
Opiates: POSITIVE — AB

## 2011-11-05 LAB — CBC WITH DIFFERENTIAL/PLATELET
Basophils Relative: 1 % (ref 0–1)
Eosinophils Absolute: 0.4 10*3/uL (ref 0.0–0.7)
Eosinophils Relative: 4 % (ref 0–5)
MCH: 27.8 pg (ref 26.0–34.0)
MCHC: 32.7 g/dL (ref 30.0–36.0)
MCV: 85.1 fL (ref 78.0–100.0)
Neutrophils Relative %: 57 % (ref 43–77)
Platelets: 335 10*3/uL (ref 150–400)
RDW: 13.7 % (ref 11.5–15.5)

## 2011-11-05 LAB — URINALYSIS, ROUTINE W REFLEX MICROSCOPIC
Bilirubin Urine: NEGATIVE
Glucose, UA: NEGATIVE mg/dL
Ketones, ur: NEGATIVE mg/dL
Nitrite: NEGATIVE
Specific Gravity, Urine: 1.013 (ref 1.005–1.030)
pH: 6.5 (ref 5.0–8.0)

## 2011-11-05 LAB — TROPONIN I: Troponin I: <0.3 ng/mL (ref <0.30)

## 2011-11-05 LAB — URINE MICROSCOPIC-ADD ON

## 2011-11-05 MED ORDER — HYDROCHLOROTHIAZIDE 12.5 MG PO CAPS
12.5000 mg | ORAL_CAPSULE | Freq: Every day | ORAL | Status: DC
  Administered 2011-11-06: 12.5 mg via ORAL
  Filled 2011-11-05: qty 1

## 2011-11-05 MED ORDER — LORAZEPAM 1 MG PO TABS
1.0000 mg | ORAL_TABLET | Freq: Three times a day (TID) | ORAL | Status: DC | PRN
  Administered 2011-11-05 – 2011-11-06 (×2): 1 mg via ORAL
  Filled 2011-11-05: qty 1

## 2011-11-05 MED ORDER — LORAZEPAM 1 MG PO TABS
1.0000 mg | ORAL_TABLET | Freq: Once | ORAL | Status: AC
  Administered 2011-11-05: 1 mg via ORAL

## 2011-11-05 MED ORDER — LAMOTRIGINE 200 MG PO TABS
200.0000 mg | ORAL_TABLET | Freq: Two times a day (BID) | ORAL | Status: DC
  Administered 2011-11-05 – 2011-11-06 (×2): 200 mg via ORAL
  Filled 2011-11-05 (×3): qty 1

## 2011-11-05 MED ORDER — LORAZEPAM 1 MG PO TABS
ORAL_TABLET | ORAL | Status: AC
  Filled 2011-11-05: qty 1

## 2011-11-05 MED ORDER — ACETAMINOPHEN 325 MG PO TABS
650.0000 mg | ORAL_TABLET | Freq: Once | ORAL | Status: AC
  Administered 2011-11-05: 650 mg via ORAL
  Filled 2011-11-05: qty 2

## 2011-11-05 MED ORDER — GABAPENTIN 300 MG PO CAPS
300.0000 mg | ORAL_CAPSULE | Freq: Three times a day (TID) | ORAL | Status: DC
  Administered 2011-11-05 – 2011-11-06 (×3): 300 mg via ORAL
  Filled 2011-11-05 (×5): qty 1

## 2011-11-05 MED ORDER — LORAZEPAM 1 MG PO TABS
1.0000 mg | ORAL_TABLET | Freq: Once | ORAL | Status: DC
  Filled 2011-11-05 (×2): qty 1
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
## 2011-11-05 NOTE — ED Notes (Signed)
Pt reports history of PTSD which was exacerbated last night after looking at animals being shackled online. Pt tearful, anxious and reports generalized weakness and blurred vision

## 2011-11-05 NOTE — ED Notes (Signed)
ACT team speaking with family

## 2011-11-05 NOTE — ED Notes (Signed)
Pt in the bathroom on arrival, sitter w/ pt

## 2011-11-05 NOTE — ED Notes (Signed)
Attempted to collect urine from pt. Again. Pt. Was placed on bedpan but still is unable to void. Pt. Was given of water and will try again in 15 minutes.
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
## 2011-11-05 NOTE — ED Notes (Signed)
Up in hall, ambulating w/o difficulty, sitter w/ pt.
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
## 2011-11-05 NOTE — ED Notes (Signed)
Went to collect labs - pt needed a few minutes for medication to kick in.  Will come back in 15 min.
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
## 2011-11-05 NOTE — ED Notes (Signed)
Patient attempting to give a urine sample. Pt does not want an additional ativan at this time as her anxiety has decreased. Pt reports she will try to give a sample at a later time and does not wish to be catheterized. Pt in no apparent distress with daughter and sitter at bedside.

## 2011-11-05 NOTE — ED Notes (Signed)
Pt. Attempted to void on bedpan. Pt. Said that she could not void at that time. Will assess and try again in one-half hour.

## 2011-11-05 NOTE — ED Notes (Signed)
Bedside commode at bedside, attempting to get urine

## 2011-11-06 MED ORDER — IBUPROFEN 100 MG/5ML PO SUSP: 400.0000 mg | Freq: Once | ORAL | Status: DC

## 2011-11-06 MED ORDER — ACETAMINOPHEN 325 MG PO TABS
650.0000 mg | ORAL_TABLET | Freq: Once | ORAL | Status: AC
  Administered 2011-11-06: 650 mg via ORAL
  Filled 2011-11-06: qty 2

## 2011-11-06 MED ORDER — IBUPROFEN 200 MG PO TABS
400.0000 mg | ORAL_TABLET | Freq: Once | ORAL | Status: AC
Start: 2011-11-06 — End: 2011-11-06
  Administered 2011-11-06: 400 mg via ORAL
  Filled 2011-11-06: qty 2

## 2011-11-06 NOTE — ED Notes (Signed)
GUY:[id number]<QQ> Expected date:<BR> Expected time:<BR> Means of arrival:<BR> Comments:<BR> Closed

## 2011-11-06 NOTE — Progress Notes (Signed)
Nurse to contact Angelica Chessman at [facility] to discuss Lloyd Huger PA concerns last night about pt being evaluated for neurological or stroke related symptoms/issues.  ACT spoke with Dr. Lars Mage last night prior to leaving and MD did not see where pt was having neurological issues at that time.  An order was placed to have pt walk and be observed to determine if pt can perform her ADLs.    AC at [facility] #  [phone]

## 2011-11-06 NOTE — ED Notes (Signed)
Tele psych consult currently occuring.

## 2011-11-06 NOTE — Progress Notes (Signed)
Called telepsych to check on status. Advised there are many consults in and we should expect the call within 1.5 hrs.

## 2011-11-06 NOTE — ED Notes (Signed)
1700 telepsych complete.

## 2011-11-06 NOTE — ED Provider Notes (Signed)
Pt has had telepsych consult who feels she can be discharged. Dr Bing Plume said she seemed improved today from when he had seen her yesterday.   PT is pleasant and has good eyecontact. States she feels much better. States she is going to get back into therapy.   Devoria Albe, MD, Armando Gang   Ward Givens, MD 11/06/11 [phone]

## 2011-11-06 NOTE — Progress Notes (Signed)
Pt having first phone call of the day. 

## 2011-11-06 NOTE — Progress Notes (Signed)
Pt wants update concerning her status. ED MD notified and will come to see her.

## 2011-11-06 NOTE — ED Notes (Signed)
Telepsych fax received and given to ED MD.

## 2011-11-06 NOTE — Progress Notes (Signed)
Pt c/o HA. Pt states she had chronic HA and, "Goody powders are the only thing that help." Advised pt that we do not carry that drug, but I would notifiy ED MD about her HA and request for pain med. ED MD notified and orders given.

## 2011-11-06 NOTE — Progress Notes (Signed)
Tele Psych request form on Dr. Wonda Amis desk ready for him to sign.  MD in with another medically acute pt.  MD will take to nurse treating pt for her to fax or call ACT when form is ready.

## 2011-11-22 ENCOUNTER — Ambulatory Visit (HOSPITAL_COMMUNITY): Admission: RE | Admit: 2011-11-22 | Payer: Medicare Other | Source: Home / Self Care | Admitting: Psychiatry

## 2011-11-24 ENCOUNTER — Telehealth: Payer: Self-pay | Admitting: Internal Medicine

## 2011-11-24 MED ORDER — PNEUMOCOCCAL VAC POLYVALENT 25 MCG/0.5ML IJ INJ: 0.5000 mL | INJECTION | Freq: Once | INTRAMUSCULAR | Status: DC

## 2011-11-24 NOTE — Telephone Encounter (Signed)
RX sent

## 2011-11-24 NOTE — Telephone Encounter (Signed)
wants pneumonia vaccine sent to [facility] on [address] Pt does not normally use this pharmacy but she is using for her vaccines only

## 2011-11-24 NOTE — Telephone Encounter (Signed)
pt is now interested in the shingles vaccine and needed to see if she needed it  Cb# [phone]

## 2011-11-25 MED ORDER — ZOSTER VACCINE LIVE 19400 UNT/0.65ML ~~LOC~~ SOLR: 0.6500 mL | Freq: Once | SUBCUTANEOUS | Status: DC

## 2011-11-25 NOTE — Telephone Encounter (Signed)
I see no contraindication for Zostavax after chart review, Rx printed

## 2011-11-25 NOTE — Telephone Encounter (Signed)
Please advise 

## 2011-12-23 ENCOUNTER — Ambulatory Visit (INDEPENDENT_AMBULATORY_CARE_PROVIDER_SITE_OTHER): Payer: Medicare Other | Admitting: Family Medicine

## 2011-12-23 ENCOUNTER — Encounter: Payer: Self-pay | Admitting: Family Medicine

## 2011-12-23 VITALS — BP 121/80 | HR 69 | Temp 98.3°F | Ht 67.0 in | Wt 165.4 lb

## 2011-12-23 DIAGNOSIS — J329 Chronic sinusitis, unspecified: Secondary | ICD-10-CM

## 2011-12-23 MED ORDER — AMOXICILLIN 875 MG PO TABS: 875.0000 mg | ORAL_TABLET | Freq: Two times a day (BID) | ORAL | Status: DC

## 2011-12-23 NOTE — Progress Notes (Signed)
  Subjective:    Patient ID: Tammy Stevens, female    DOB: May 22, 1945, 66 y.o.   MRN: [id number]  HPI Sinus infxn- pt reports hx of this twice yearly, developed severe HA 5-7 days ago.  + nasal congestion w/out drainage.  + facial pain.  No ear pain but pressure.  No fevers.  Not currently on seasonal allergy meds- previously on Flonase but this caused overdrying.  Minimal cough.   Review of Systems For ROS see HPI     Objective:   Physical Exam  Vitals reviewed. Constitutional: She appears well-developed and well-nourished. No distress.  HENT:  Head: Normocephalic and atraumatic.  Right Ear: Tympanic membrane normal.  Left Ear: Tympanic membrane normal.  Nose: Mucosal edema and rhinorrhea present. Right sinus exhibits maxillary sinus tenderness and frontal sinus tenderness. Left sinus exhibits maxillary sinus tenderness and frontal sinus tenderness.  Mouth/Throat: Uvula is midline and mucous membranes are normal. Posterior oropharyngeal erythema present. No oropharyngeal exudate.  Eyes: Conjunctivae normal and EOM are normal. Pupils are equal, round, and reactive to light.  Neck: Normal range of motion. Neck supple.  Cardiovascular: Normal rate, regular rhythm and normal heart sounds.   Pulmonary/Chest: Effort normal and breath sounds normal. No respiratory distress. She has no wheezes.  Lymphadenopathy:    She has no cervical adenopathy.          Assessment & Plan:

## 2011-12-23 NOTE — Patient Instructions (Addendum)
Start the Amoxicillin twice daily for the infection- take w/ food Add Claritin or Zyrtec daily for the allergy component Drink plenty of fluids REST! Hang in there!!!

## 2011-12-25 DIAGNOSIS — J329 Chronic sinusitis, unspecified: Secondary | ICD-10-CM | POA: Insufficient documentation

## 2011-12-25 NOTE — Assessment & Plan Note (Signed)
New.  Pt's sxs and PE consistent w/ infxn.  Start abx.  Reviewed the impact of untreated allergies on sinusitis.  Start OTC antihistamines.  Reviewed supportive care and red flags that should prompt return.  Pt expressed understanding and is in agreement w/ plan.

## 2012-01-08 ENCOUNTER — Other Ambulatory Visit: Payer: Self-pay | Admitting: Internal Medicine

## 2012-01-11 NOTE — Telephone Encounter (Signed)
Refill done.  

## 2012-01-17 ENCOUNTER — Telehealth: Payer: Self-pay | Admitting: Internal Medicine

## 2012-01-17 MED ORDER — FLUTICASONE PROPIONATE 50 MCG/ACT NA SUSP: 2.0000 | Freq: Every day | NASAL | Status: DC

## 2012-01-17 NOTE — Telephone Encounter (Signed)
pt called this morning saw dr.tabori 10.18.13 still having sinus headaches is requesting a nasal spray be called in to [facility] on [address] cb# [phone]

## 2012-01-17 NOTE — Telephone Encounter (Signed)
Discussed with pt, rx sent to pharmacy.  

## 2012-01-17 NOTE — Telephone Encounter (Signed)
If she has severe symptoms, fever, chills, facial pain: Needs to be seen. If she has mild runny nose, okay to call Flonase 2 sprays in each side of the nose, one bottle, no refills. If not better--->  needs office visit

## 2012-01-17 NOTE — Telephone Encounter (Signed)
Please advise 

## 2012-03-02 ENCOUNTER — Encounter: Payer: Self-pay | Admitting: Family Medicine

## 2012-03-23 ENCOUNTER — Other Ambulatory Visit: Payer: Self-pay | Admitting: Internal Medicine

## 2012-03-23 NOTE — Telephone Encounter (Signed)
Pt has not been seen within a year. OK to refill? 

## 2012-03-23 NOTE — Telephone Encounter (Signed)
Refill denied. Discuss with pt, she states that she will call back to schedule an appt.

## 2012-03-23 NOTE — Telephone Encounter (Signed)
Needs office visit.

## 2012-03-23 NOTE — Telephone Encounter (Signed)
Per Dr. Drue Novel refill denied. Pt needs an OV.

## 2012-03-23 NOTE — Telephone Encounter (Signed)
FLONASE 0.05% SPR  Qty: 16 Last fill: 11.12.2013 Use two spray into the nose everyday

## 2012-04-06 ENCOUNTER — Other Ambulatory Visit: Payer: Self-pay | Admitting: Internal Medicine

## 2012-04-06 NOTE — Telephone Encounter (Signed)
Pt has not been seen within a year. OK to refill? 

## 2012-04-06 NOTE — Telephone Encounter (Signed)
lmovm for pt to return call.  

## 2012-04-06 NOTE — Telephone Encounter (Signed)
Schedule a routine visit within 2 weeks. Okay to refill #15 tablets. Please be sure the patient knows:  no further refills without OV

## 2012-04-10 NOTE — Telephone Encounter (Signed)
2 week refill done.

## 2012-05-07 ENCOUNTER — Other Ambulatory Visit: Payer: Self-pay | Admitting: Internal Medicine

## 2012-05-07 NOTE — Telephone Encounter (Signed)
Called pt to schedule appt & pt states that she would like to switch to Dr. Beverely Low, pt spoke to [place] & schedule appt.  Refill denied

## 2012-05-07 NOTE — Telephone Encounter (Signed)
Call patient, unable to refill, needs office visit , please arrange for office visit this week, overbook okay.

## 2012-05-07 NOTE — Telephone Encounter (Signed)
Pt has not been seen within a year. Ok to refill? 

## 2012-05-10 ENCOUNTER — Other Ambulatory Visit: Payer: Self-pay | Admitting: Internal Medicine

## 2012-05-10 NOTE — Telephone Encounter (Signed)
Ok to refill 

## 2012-05-10 NOTE — Telephone Encounter (Signed)
Pt switched to new PCP

## 2012-05-14 NOTE — Telephone Encounter (Signed)
Please advise on RF request.  See note.//AB/CMA

## 2012-06-13 ENCOUNTER — Ambulatory Visit (INDEPENDENT_AMBULATORY_CARE_PROVIDER_SITE_OTHER): Payer: Medicare Other | Admitting: Family Medicine

## 2012-06-13 ENCOUNTER — Encounter: Payer: Self-pay | Admitting: Family Medicine

## 2012-06-13 VITALS — BP 148/80 | HR 69 | Temp 98.1°F | Ht 66.25 in | Wt 166.6 lb

## 2012-06-13 DIAGNOSIS — E2839 Other primary ovarian failure: Secondary | ICD-10-CM

## 2012-06-13 DIAGNOSIS — E785 Hyperlipidemia, unspecified: Secondary | ICD-10-CM

## 2012-06-13 DIAGNOSIS — Z1231 Encounter for screening mammogram for malignant neoplasm of breast: Secondary | ICD-10-CM

## 2012-06-13 DIAGNOSIS — Z0000 Encounter for general adult medical examination without abnormal findings: Secondary | ICD-10-CM

## 2012-06-13 DIAGNOSIS — I10 Essential (primary) hypertension: Secondary | ICD-10-CM

## 2012-06-13 LAB — LIPID PANEL
Cholesterol: 263 mg/dL — ABNORMAL HIGH (ref 0–200)
Total CHOL/HDL Ratio: 5
Triglycerides: 184 mg/dL — ABNORMAL HIGH (ref 0.0–149.0)
VLDL: 36.8 mg/dL (ref 0.0–40.0)

## 2012-06-13 LAB — CBC WITH DIFFERENTIAL/PLATELET
Basophils Absolute: 0 10*3/uL (ref 0.0–0.1)
Eosinophils Absolute: 0.2 10*3/uL (ref 0.0–0.7)
HCT: 43.3 % (ref 36.0–46.0)
Hemoglobin: 14.3 g/dL (ref 12.0–15.0)
Lymphs Abs: 2.9 10*3/uL (ref 0.7–4.0)
MCHC: 33 g/dL (ref 30.0–36.0)
Monocytes Absolute: 0.4 10*3/uL (ref 0.1–1.0)
Neutro Abs: 5.1 10*3/uL (ref 1.4–7.7)
RDW: 15.6 % — ABNORMAL HIGH (ref 11.5–14.6)

## 2012-06-13 LAB — HEPATIC FUNCTION PANEL
Bilirubin, Direct: 0.1 mg/dL (ref 0.0–0.3)
Total Bilirubin: 0.5 mg/dL (ref 0.3–1.2)
Total Protein: 7.9 g/dL (ref 6.0–8.3)

## 2012-06-13 LAB — BASIC METABOLIC PANEL
Calcium: 9.9 mg/dL (ref 8.4–10.5)
Creatinine, Ser: 1 mg/dL (ref 0.4–1.2)

## 2012-06-13 MED ORDER — HYDROCHLOROTHIAZIDE 12.5 MG PO CAPS: 12.5000 mg | ORAL_CAPSULE | ORAL | Status: DC

## 2012-06-13 NOTE — Progress Notes (Signed)
  Subjective:    Patient ID: Tammy Stevens, female    DOB: 06-21-1945, 67 y.o.   MRN: [id number]  HPI Here today for CPE.  Risk Factors: HTN- chronic problem, ran out of HCTZ.  BP is slightly elevated.  No CP, SOB, HAs, visual changes, edema. Hyperlipidemia- noted on problem list, pt has never been on cholesterol meds. Physical Activity: no regular exercise Fall Risks: low Depression: 'i stay depressed', following regularly w/ Dr Evelene Croon Hearing: normal to conversational tones, decreased to whispered voice ADL's: independent Cognitive: normal linear thought process, memory and attention intact Home Safety: safe at home, lives alone.  Good support system. Height, Weight, BMI, Visual Acuity: see vitals, vision corrected to 20/20 w/ glasses Counseling: last colonoscopy >10 yrs ago (Dr Loreta Ave), not interested at this time in having another.  Due for mammo and DEXA.  UTD on pap. Labs Ordered: See A&P Care Plan: See A&P    Review of Systems Patient reports no vision/ hearing changes, adenopathy,fever, weight change,  persistant/recurrent hoarseness , swallowing issues, chest pain, palpitations, edema, persistant/recurrent cough, hemoptysis, dyspnea (rest/exertional/paroxysmal nocturnal), gastrointestinal bleeding (melena, rectal bleeding), abdominal pain, significant heartburn, bowel changes, GU symptoms (dysuria, hematuria, incontinence), Gyn symptoms (abnormal  bleeding, pain),  syncope, focal weakness, memory loss, numbness & tingling, skin/hair/nail changes, abnormal bruising or bleeding, anxiety, or depression.     Objective:   Physical Exam  General Appearance:    Alert, cooperative, no distress, appears stated age  Head:    Normocephalic, without obvious abnormality, atraumatic  Eyes:    PERRL, conjunctiva/corneas clear, EOM's intact, fundi    benign, both eyes  Ears:    Normal TM's and external ear canals, both ears  Nose:   Nares normal, septum midline, mucosa normal, no drainage    or  sinus tenderness  Throat:   Lips, mucosa, and tongue normal; teeth and gums normal  Neck:   Supple, symmetrical, trachea midline, no adenopathy;    Thyroid: no enlargement/tenderness/nodules  Back:     Symmetric, no curvature, ROM normal, no CVA tenderness  Lungs:     Clear to auscultation bilaterally, respirations unlabored  Chest Wall:    No tenderness or deformity   Heart:    Regular rate and rhythm, S1 and S2 normal, no murmur, rub   or gallop  Breast Exam:    No tenderness, masses, or nipple abnormality  Abdomen:     Soft, non-tender, bowel sounds active all four quadrants,    no masses, no organomegaly  Genitalia:    deferred  Rectal:    Extremities:   Extremities normal, atraumatic, no cyanosis or edema  Pulses:   2+ and symmetric all extremities  Skin:   Skin color, texture, turgor normal, no rashes or lesions  Lymph nodes:   Cervical, supraclavicular, and axillary nodes normal  Neurologic:   CNII-XII intact, normal strength, sensation and reflexes    throughout          Assessment & Plan:

## 2012-06-13 NOTE — Patient Instructions (Signed)
Follow up in 6 months to recheck BP Restart the HCTZ daily We'll notify you of your lab results and make any changes if needed Someone will call you with your mammo and bone density We'll figure out if you had your injections (pneumonia and shingles) Call with any questions or concerns Happy Spring!

## 2012-06-25 NOTE — Assessment & Plan Note (Signed)
Chronic problem.  Deteriorated since pt ran out of meds.  Refill provided.  Pt to monitor BP at home and call if BP reamins >140/90.  Pt expressed understanding and is in agreement w/ plan.

## 2012-06-25 NOTE — Assessment & Plan Note (Signed)
Pt's PE WNL.  Overdue on colonoscopy but not interested at this time.  Will refer for mammo and DEXA.  UTD on pap.  Check labs.  Anticipatory guidance provided.

## 2012-06-25 NOTE — Assessment & Plan Note (Signed)
On pt's problem list but pt unaware of this.  Has never been on meds.  Check labs.  Start tx prn.

## 2012-07-06 ENCOUNTER — Ambulatory Visit
Admission: RE | Admit: 2012-07-06 | Discharge: 2012-07-06 | Disposition: A | Discharge status: Home / Self Care | Payer: Medicare Other | Source: Ambulatory Visit | Attending: Family Medicine | Admitting: Family Medicine

## 2012-07-06 DIAGNOSIS — Z1231 Encounter for screening mammogram for malignant neoplasm of breast: Secondary | ICD-10-CM

## 2012-07-06 DIAGNOSIS — E2839 Other primary ovarian failure: Secondary | ICD-10-CM

## 2012-07-16 ENCOUNTER — Encounter: Payer: Self-pay | Admitting: Family Medicine

## 2012-07-16 ENCOUNTER — Ambulatory Visit (INDEPENDENT_AMBULATORY_CARE_PROVIDER_SITE_OTHER): Payer: Medicare Other | Admitting: Family Medicine

## 2012-07-16 VITALS — BP 140/80 | HR 65 | Temp 98.4°F | Ht 66.25 in | Wt 167.4 lb

## 2012-07-16 DIAGNOSIS — T50905A Adverse effect of unspecified drugs, medicaments and biological substances, initial encounter: Secondary | ICD-10-CM

## 2012-07-16 DIAGNOSIS — T887XXA Unspecified adverse effect of drug or medicament, initial encounter: Secondary | ICD-10-CM

## 2012-07-16 NOTE — Assessment & Plan Note (Signed)
New.  Pt's sxs correspond w/ increased neurontin dose and are known side effects of medication.  Encouraged pt to discuss this w/ psych and come up w/ new plan.  Will follow.

## 2012-07-16 NOTE — Progress Notes (Signed)
  Subjective:    Patient ID: Tammy Stevens, female    DOB: [birth year], 67 y.o.   MRN: [id number]  HPI Extreme thirst and weakness- 'almost instantly like i have to lie down'.  Last fasting sugar was 85.  No changes in medication w/ exception of increasing Neurontin dose 3 weeks ago.  Pt's sxs correspond to the med change 3 weeks ago.  + fatigue.   Review of Systems For ROS see HPI     Objective:   Physical Exam  Vitals reviewed. Constitutional: She is oriented to person, place, and time. She appears well-developed and well-nourished. No distress.  HENT:  Head: Normocephalic and atraumatic.  Eyes: Conjunctivae and EOM are normal. Pupils are equal, round, and reactive to light.  Neck: Normal range of motion. Neck supple. No thyromegaly present.  Cardiovascular: Normal rate, regular rhythm, normal heart sounds and intact distal pulses.   No murmur heard. Pulmonary/Chest: Effort normal and breath sounds normal. No respiratory distress.  Abdominal: Soft. She exhibits no distension. There is no tenderness.  Musculoskeletal: She exhibits no edema.  Lymphadenopathy:    She has no cervical adenopathy.  Neurological: She is alert and oriented to person, place, and time.  Skin: Skin is warm and dry.  Psychiatric: She has a normal mood and affect. Her behavior is normal.          Assessment & Plan:

## 2012-07-16 NOTE — Patient Instructions (Addendum)
This appears to be related to the increase in neurontin Ask your Psychiatrist about adjusting the dose If and when the urinary symptoms become too bothersome, let me know Call with any questions or concerns Happy [place] Mother[place] Day!

## 2012-08-01 ENCOUNTER — Encounter: Payer: Self-pay | Admitting: Cardiology

## 2012-08-06 ENCOUNTER — Encounter: Payer: Self-pay | Admitting: Family Medicine

## 2012-08-21 ENCOUNTER — Emergency Department (HOSPITAL_BASED_OUTPATIENT_CLINIC_OR_DEPARTMENT_OTHER)
Admission: EM | Admit: 2012-08-21 | Discharge: 2012-08-22 | Disposition: A | Discharge status: Home / Self Care | Payer: Medicare Other | Attending: Emergency Medicine | Admitting: Emergency Medicine

## 2012-08-21 ENCOUNTER — Encounter (HOSPITAL_BASED_OUTPATIENT_CLINIC_OR_DEPARTMENT_OTHER): Payer: Self-pay | Admitting: *Deleted

## 2012-08-21 DIAGNOSIS — S00411A Abrasion of right ear, initial encounter: Secondary | ICD-10-CM

## 2012-08-21 DIAGNOSIS — Z8719 Personal history of other diseases of the digestive system: Secondary | ICD-10-CM | POA: Insufficient documentation

## 2012-08-21 DIAGNOSIS — Y929 Unspecified place or not applicable: Secondary | ICD-10-CM | POA: Insufficient documentation

## 2012-08-21 DIAGNOSIS — F319 Bipolar disorder, unspecified: Secondary | ICD-10-CM | POA: Insufficient documentation

## 2012-08-21 DIAGNOSIS — X58XXXA Exposure to other specified factors, initial encounter: Secondary | ICD-10-CM | POA: Insufficient documentation

## 2012-08-21 DIAGNOSIS — T169XXA Foreign body in ear, unspecified ear, initial encounter: Secondary | ICD-10-CM | POA: Insufficient documentation

## 2012-08-21 DIAGNOSIS — Z862 Personal history of diseases of the blood and blood-forming organs and certain disorders involving the immune mechanism: Secondary | ICD-10-CM | POA: Insufficient documentation

## 2012-08-21 DIAGNOSIS — F1021 Alcohol dependence, in remission: Secondary | ICD-10-CM | POA: Insufficient documentation

## 2012-08-21 DIAGNOSIS — IMO0002 Reserved for concepts with insufficient information to code with codable children: Secondary | ICD-10-CM | POA: Insufficient documentation

## 2012-08-21 DIAGNOSIS — Z8639 Personal history of other endocrine, nutritional and metabolic disease: Secondary | ICD-10-CM | POA: Insufficient documentation

## 2012-08-21 DIAGNOSIS — T161XXA Foreign body in right ear, initial encounter: Secondary | ICD-10-CM

## 2012-08-21 DIAGNOSIS — Z79899 Other long term (current) drug therapy: Secondary | ICD-10-CM | POA: Insufficient documentation

## 2012-08-21 DIAGNOSIS — Y9389 Activity, other specified: Secondary | ICD-10-CM | POA: Insufficient documentation

## 2012-08-21 DIAGNOSIS — Z8709 Personal history of other diseases of the respiratory system: Secondary | ICD-10-CM | POA: Insufficient documentation

## 2012-08-21 DIAGNOSIS — G4730 Sleep apnea, unspecified: Secondary | ICD-10-CM | POA: Insufficient documentation

## 2012-08-21 DIAGNOSIS — Z8659 Personal history of other mental and behavioral disorders: Secondary | ICD-10-CM | POA: Insufficient documentation

## 2012-08-21 DIAGNOSIS — Z8669 Personal history of other diseases of the nervous system and sense organs: Secondary | ICD-10-CM | POA: Insufficient documentation

## 2012-08-21 DIAGNOSIS — I10 Essential (primary) hypertension: Secondary | ICD-10-CM | POA: Insufficient documentation

## 2012-08-21 DIAGNOSIS — Z87891 Personal history of nicotine dependence: Secondary | ICD-10-CM | POA: Insufficient documentation

## 2012-08-21 MED ORDER — ANTIPYRINE-BENZOCAINE 5.4-1.4 % OT SOLN
OTIC | Status: AC
  Filled 2012-08-21: qty 10

## 2012-08-21 MED ORDER — ANTIPYRINE-BENZOCAINE 5.4-1.4 % OT SOLN
4.0000 [drp] | OTIC | Status: DC | PRN
  Administered 2012-08-21: 4 [drp] via OTIC

## 2012-08-21 NOTE — ED Notes (Signed)
Pt brought in by ems for bug in right ear x 10 mins

## 2012-08-22 NOTE — ED Notes (Signed)
Pt given cab voucher to home
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
## 2012-08-23 ENCOUNTER — Ambulatory Visit (INDEPENDENT_AMBULATORY_CARE_PROVIDER_SITE_OTHER): Payer: Medicare Other | Admitting: Family Medicine

## 2012-08-23 ENCOUNTER — Encounter: Payer: Self-pay | Admitting: Family Medicine

## 2012-08-23 ENCOUNTER — Telehealth: Payer: Self-pay | Admitting: Family Medicine

## 2012-08-23 VITALS — BP 130/90 | HR 74 | Temp 97.9°F | Ht 66.25 in | Wt 164.0 lb

## 2012-08-23 DIAGNOSIS — H9201 Otalgia, right ear: Secondary | ICD-10-CM

## 2012-08-23 DIAGNOSIS — H9209 Otalgia, unspecified ear: Secondary | ICD-10-CM

## 2012-08-23 NOTE — Telephone Encounter (Signed)
Patient Information:  Caller Name: [facility]  Phone: [phone]  Patient: Tammy, Stevens  Gender: Female  DOB: 06/30/1945  Age: 67 Years  PCP: Sheliah Hatch.  Office Follow Up:  Does the office need to follow up with this patient?: No  Instructions For The Office: N/A   Symptoms  Reason For Call & Symptoms: Bug flew into right ear 08/21/12 so she called 911 due extreme ear pain.  Flying roach removed from ear  in [place] ED. Physician thought insect was "gnawing on" her ear drum. Treated with Aurodex otic drops (antipyrine and benzocaine) ear drops BID.  Mild ear ache present.  Asking if needs antibiotic.  Reviewed Health History In EMR: Yes  Reviewed Medications In EMR: Yes  Reviewed Allergies In EMR: Yes  Reviewed Surgeries / Procedures: Yes  Date of Onset of Symptoms: 08/21/2012  Treatments Tried: Aurodex otic drops BID  Treatments Tried Worked: Yes  Guideline(s) Used:  Ear - Foreign Body  Disposition Per Guideline:   See Today or Tomorrow in Office  Reason For Disposition Reached:   Foreign body was removed from the ear but still has dull pain. Advice Given:  Removing a Live Insect - Method 1:  Go into a dark room and shine a flashlight by the ear.  The insect will often come out on its own.  Removing a Live Insect - Method 2:   If the first method fails, kill the insect by filling the ear canal with mineral oil (baby oil) or olive oil.  Get a rubber ear syringe from the pharmacy. A syringe is included in the Murine Ear Wax Removal System, available over-the-counter (OTC).  Gently squirt warm water into the ear canal using an ear syringe (or a nasal suction bulb).  Call Back If:   Pus or cloudy discharge from ear occurs  You become worse.  Patient Will Follow Care Advice:  YES  Appointment Scheduled:  08/23/2012 14:45:00 Appointment Scheduled Provider:  Sheliah Hatch.

## 2012-08-23 NOTE — Progress Notes (Signed)
  Subjective:    Patient ID: Tammy Stevens, female    DOB: Dec 03, 1945, 67 y.o.   MRN: [id number]  HPI Ear pain- pt had live cockroach removed from R ear 2 nights ago after it flew directly into ear.  Mild ache.  No drainage.  Using ear drops that ER gave her (Auralgan).   Review of Systems For ROS see HPI     Objective:   Physical Exam  Vitals reviewed. Constitutional: She appears well-developed and well-nourished. No distress.  HENT:  Head: Normocephalic and atraumatic.  Right Ear: External ear normal.  Left Ear: External ear normal.  TMs WNL bilaterally w/out evidence of foreign body in canal          Assessment & Plan:

## 2012-08-23 NOTE — Patient Instructions (Addendum)
The good news is that your ear looks good Try and get over the mental freakout (if you can!) Call with any questions or concerns Hang in there! Happy Early [place] Ouch!

## 2012-08-23 NOTE — Telephone Encounter (Signed)
Appointment Scheduled:  08/23/2012 14:45:00  Appointment Scheduled Provider:  Sheliah Hatch.

## 2012-09-05 ENCOUNTER — Ambulatory Visit (INDEPENDENT_AMBULATORY_CARE_PROVIDER_SITE_OTHER): Payer: Medicare Other | Admitting: Family Medicine

## 2012-09-05 ENCOUNTER — Encounter: Payer: Self-pay | Admitting: Family Medicine

## 2012-09-05 VITALS — BP 120/78 | HR 61 | Temp 98.6°F | Ht 66.25 in | Wt 163.2 lb

## 2012-09-05 DIAGNOSIS — J329 Chronic sinusitis, unspecified: Secondary | ICD-10-CM

## 2012-09-05 MED ORDER — AMOXICILLIN 875 MG PO TABS: 875.0000 mg | ORAL_TABLET | Freq: Two times a day (BID) | ORAL | Status: DC

## 2012-09-05 NOTE — Assessment & Plan Note (Signed)
New.  Pt's sxs and PE consistent w/ infxn.  Start abx.  Reviewed supportive care and red flags that should prompt return.  Pt expressed understanding and is in agreement w/ plan.  

## 2012-09-05 NOTE — Patient Instructions (Addendum)
This is a sinus infection Start the Amox twice daily Drink plenty of fluids REST! Call with any questions or concerns Hang in there! Happy 4th and early birthday!

## 2012-09-05 NOTE — Progress Notes (Signed)
  Subjective:    Patient ID: Tammy Stevens, female    DOB: Feb 14, 1946, 67 y.o.   MRN: [id number]  HPI Sinus headache- pt reports HA x4 days, nasal congestion.  No fevers.  No known sick contacts.  No N/V/D.  No tooth pain, ear pain.  No cough.  Reports eyelids feel like 'it's stuck to my eye ball'.  Has seen eye doctor recently for this.  Has eye drops to use   Review of Systems For ROS see HPI     Objective:   Physical Exam  Constitutional: She appears well-developed and well-nourished. No distress.  HENT:  Head: Normocephalic and atraumatic.  Right Ear: Tympanic membrane normal.  Left Ear: Tympanic membrane normal.  Nose: Mucosal edema and rhinorrhea present. Right sinus exhibits maxillary sinus tenderness. Right sinus exhibits no frontal sinus tenderness. Left sinus exhibits maxillary sinus tenderness. Left sinus exhibits no frontal sinus tenderness.  Mouth/Throat: Uvula is midline and mucous membranes are normal. Posterior oropharyngeal erythema present. No oropharyngeal exudate.  Eyes: Conjunctivae and EOM are normal. Pupils are equal, round, and reactive to light.  Neck: Normal range of motion. Neck supple.  Cardiovascular: Normal rate, regular rhythm and normal heart sounds.   Pulmonary/Chest: Effort normal and breath sounds normal. No respiratory distress. She has no wheezes.  Lymphadenopathy:    She has no cervical adenopathy.          Assessment & Plan:

## 2012-09-13 NOTE — Assessment & Plan Note (Signed)
New.  No evidence of irritation or foreign body present.  Reassurance provided.

## 2012-12-06 ENCOUNTER — Ambulatory Visit (INDEPENDENT_AMBULATORY_CARE_PROVIDER_SITE_OTHER): Payer: Medicare Other | Admitting: Family Medicine

## 2012-12-06 ENCOUNTER — Encounter: Payer: Self-pay | Admitting: Family Medicine

## 2012-12-06 VITALS — BP 120/76 | HR 58 | Temp 98.0°F | Resp 15 | Wt 156.4 lb

## 2012-12-06 DIAGNOSIS — L989 Disorder of the skin and subcutaneous tissue, unspecified: Secondary | ICD-10-CM

## 2012-12-06 NOTE — Progress Notes (Signed)
  Subjective:    Patient ID: Tammy Stevens, female    DOB: 01-25-1946, 67 y.o.   MRN: [id number]  HPI Skin bump- pt reports 1st noticed over 2 months ago.  Very sore to touch and w/ certain movements.  Has used abx ointment w/out relief.  Has not changed in appearance.  Has not changed size/shape.  No drainage.   Review of Systems For ROS see HPI     Objective:   Physical Exam  Vitals reviewed. Constitutional: She appears well-developed and well-nourished. No distress.  Skin: Skin is warm and dry.  1 cm pearly raised lesion on R upper chest/base of neck w/ inferior ulceration, TTP          Assessment & Plan:

## 2012-12-06 NOTE — Assessment & Plan Note (Signed)
New.  Suspicious for basal cell.  Refer urgently to derm for complete evaluation and removal.

## 2012-12-06 NOTE — Patient Instructions (Addendum)
We'll notify you of your derm appt Try not to pick or scratch Call with any questions or concerns Hang in there!

## 2013-01-14 ENCOUNTER — Other Ambulatory Visit: Payer: Self-pay | Admitting: Family Medicine

## 2013-01-14 ENCOUNTER — Telehealth: Payer: Self-pay | Admitting: Family Medicine

## 2013-01-14 MED ORDER — HYDROCHLOROTHIAZIDE 12.5 MG PO CAPS: ORAL_CAPSULE | ORAL | Status: DC

## 2013-01-14 NOTE — Telephone Encounter (Signed)
Med filled.  

## 2013-01-14 NOTE — Telephone Encounter (Signed)
Patient called to request a refill for hydrochlorothiazide (MICROZIDE) 12.5 MG capsule    Pharmacy [facility] on [address]

## 2013-01-30 ENCOUNTER — Other Ambulatory Visit: Payer: Self-pay | Admitting: General Practice

## 2013-01-30 MED ORDER — HYDROCHLOROTHIAZIDE 12.5 MG PO CAPS: ORAL_CAPSULE | ORAL | Status: DC

## 2013-06-24 ENCOUNTER — Encounter: Payer: Self-pay | Admitting: Physician Assistant

## 2013-06-24 ENCOUNTER — Ambulatory Visit (INDEPENDENT_AMBULATORY_CARE_PROVIDER_SITE_OTHER): Payer: Medicare Other | Admitting: Physician Assistant

## 2013-06-24 VITALS — BP 127/76 | HR 69 | Temp 98.6°F | Resp 16 | Ht 66.25 in | Wt 162.4 lb

## 2013-06-24 DIAGNOSIS — N8111 Cystocele, midline: Secondary | ICD-10-CM

## 2013-06-24 DIAGNOSIS — IMO0002 Reserved for concepts with insufficient information to code with codable children: Secondary | ICD-10-CM

## 2013-06-24 DIAGNOSIS — R339 Retention of urine, unspecified: Secondary | ICD-10-CM

## 2013-06-24 DIAGNOSIS — R3919 Other difficulties with micturition: Secondary | ICD-10-CM

## 2013-06-24 DIAGNOSIS — R39198 Other difficulties with micturition: Secondary | ICD-10-CM

## 2013-06-24 DIAGNOSIS — R3914 Feeling of incomplete bladder emptying: Secondary | ICD-10-CM

## 2013-06-24 DIAGNOSIS — R350 Frequency of micturition: Secondary | ICD-10-CM

## 2013-06-24 LAB — POCT URINALYSIS DIPSTICK
BILIRUBIN UA: NEGATIVE
Glucose, UA: NEGATIVE
KETONES UA: NEGATIVE
Nitrite, UA: NEGATIVE
PH UA: 6
Protein, UA: NEGATIVE
Spec Grav, UA: 1.02
Urobilinogen, UA: 0.2

## 2013-06-24 MED ORDER — CIPROFLOXACIN HCL 500 MG PO TABS: 500.0000 mg | ORAL_TABLET | Freq: Two times a day (BID) | ORAL | Status: DC

## 2013-06-24 NOTE — Patient Instructions (Signed)
Please take antibiotic as directed.  Increase fluids.  You will be contacted by specialist for further evaluation and treatment of bladder prolapse.  Please call or return to clinic as needed.

## 2013-06-24 NOTE — Assessment & Plan Note (Signed)
Cystocele on exam. Will refer to Urology for treatment.  UA + blood and leukocytes.  No scant of urine for culture.  Will empirically treat with Cipro BID x 3 days.  Daily probiotic and cranberry supplement.

## 2013-06-24 NOTE — Progress Notes (Signed)
Pre visit review using our clinic review tool, if applicable. No additional management support is needed unless otherwise documented below in the visit note/SLS  

## 2013-06-24 NOTE — Progress Notes (Signed)
Patient presents to clinic today c/o urinary frequency, urinary urgency, spraying of urine stream, and pressure in her vagina that has been present for the past 6 month.  Symptoms are intermittent.  Denies fever, chills, flank pain.  Denies hematuria.  Denies history of vaginal prolapse, cystocele or rectocele.  Has given birth vaginally twice.  Denies impassage of stool but notes occasional constipation.  Past Medical History  Diagnosis Date  . Sleep apnea     on CPAP  . Depression     DX with Bipolar in the past   . History of alcohol abuse     Quit 2008  . History of drug abuse     Quit 2008   . Hypertension   . Allergic rhinitis   . Elevated liver enzymes   . GERD (gastroesophageal reflux disease)   . Peripheral neuropathy   . Diverticulosis   . Cholelithiasis     See U/S 12/11  . Fatty liver     See U/S 12/11  . Dyspnea     Spirometry 09/26/2008 FEV1 1.85 (122%), FVC 2.57(122%),  FEV1% 72  . Hyperlipemia   . Bipolar disorder   . Depression     Current Outpatient Prescriptions on File Prior to Visit  Medication Sig Dispense Refill  . buPROPion (WELLBUTRIN XL) 150 MG 24 hr tablet Take 1 tablet by mouth daily.      Marland Kitchen gabapentin (NEURONTIN) 300 MG capsule Take 300 mg by mouth 3 (three) times daily.      . hydrochlorothiazide (MICROZIDE) 12.5 MG capsule TAKE ONE CAPSULE BY MOUTH IN THE MORNING  90 capsule  0  . lamoTRIgine (LAMICTAL) 200 MG tablet Take 200 mg by mouth 2 (two) times daily.        . pseudoephedrine (SUDAFED) 30 MG tablet Take 30 mg by mouth every 4 (four) hours as needed.       No current facility-administered medications on file prior to visit.    No Known Allergies  Family History  Problem Relation Age of Onset  . Diabetes Other     GM? (not specific)  . Heart attack Other     GF? (not specific)  . Asthma Son   . Asthma Father   . Alcohol abuse Father   . Suicidality Father   . Depression Father   . Allergies Sister     History   Social  History  . Marital Status: Single    Spouse Name: N/A    Number of Children: 2  . Years of Education: N/A   Occupational History  . sedentary [employer]   Social History Main Topics  . Smoking status: Former Smoker    Quit date: 03/07/2006  . Smokeless tobacco: None  . Alcohol Use: No     Comment: Quit 2008  . Drug Use: No  . Sexual Activity: None   Other Topics Concern  . None   Social History Narrative  . None   Review of Systems - See HPI.  All other ROS are negative.  BP 127/76  Pulse 69  Temp(Src) 98.6 F (37 C) (Oral)  Resp 16  Ht 5' 6.25" (1.683 m)  Wt 162 lb 6 oz (73.653 kg)  BMI 26.00 kg/m2  SpO2 96%  Physical Exam  Vitals reviewed. Constitutional: She is oriented to person, place, and time and well-developed, well-nourished, and in no distress.  HENT:  Head: Normocephalic and atraumatic.  Eyes: Conjunctivae are normal. Pupils are equal, round, and reactive to light.  Neck:  Neck supple.  Cardiovascular: Normal rate, regular rhythm, normal heart sounds and intact distal pulses.   Pulmonary/Chest: Effort normal and breath sounds normal. No respiratory distress. She has no wheezes. She has no rales. She exhibits no tenderness.  Abdominal: Soft. Bowel sounds are normal. She exhibits no distension and no mass. There is no tenderness. There is no rebound and no guarding.  Negative CVA tenderness.  Genitourinary: Uterus normal, cervix normal, right adnexa normal, left adnexa normal and vulva normal.  Presence of a Grade I-II Cystocele noted with speculum and bimanual examination.  Questionable mild rectocele noted.    Neurological: She is alert and oriented to person, place, and time.  Skin: Skin is warm and dry. No rash noted.  Psychiatric: Affect normal.   Assessment/Plan: Cystocele Cystocele on exam. Will refer to Urology for treatment.  UA + blood and leukocytes.  No scant of urine for culture.  Will empirically treat with Cipro BID x 3 days.  Daily probiotic  and cranberry supplement.

## 2013-07-10 ENCOUNTER — Telehealth: Payer: Self-pay | Admitting: Family Medicine

## 2013-07-10 NOTE — Telephone Encounter (Signed)
Please advise pt on any questions she may have. Thanks!

## 2013-07-10 NOTE — Telephone Encounter (Signed)
Called patient personally and spoke with her.  Patient was wanting to discuss her visit with the Urologist.  She had some further questions regarding her cystocele, which I answered.  Patient much more at ease now.

## 2013-07-10 NOTE — Telephone Encounter (Signed)
Caller name:Juline Relation to QJ:JHER Call back number:[phone]   Reason for call:  Pt is calling in regards to visit on 4/20.  Pt has questions.

## 2013-07-12 ENCOUNTER — Telehealth: Payer: Self-pay | Admitting: Family Medicine

## 2013-07-12 MED ORDER — ONDANSETRON HCL 4 MG PO TABS: 4.0000 mg | ORAL_TABLET | Freq: Three times a day (TID) | ORAL | Status: DC | PRN

## 2013-07-12 NOTE — Telephone Encounter (Signed)
Med filled will notify pt.

## 2013-07-12 NOTE — Telephone Encounter (Signed)
Pt was last seen on 06/24/13. Has not seen tabori since October 2014.

## 2013-07-12 NOTE — Telephone Encounter (Signed)
Caller name: Isaac Relation to pt: Call back number: [phone] [facility]  Reason for call:   Pt has a stomach bug with nausea and has been throwing up.  Not she is just dry heaving since she is scared to eat.  Pt would like a script to help with nausea.  Please contact to advise

## 2013-07-12 NOTE — Telephone Encounter (Signed)
Ok for Zofran 4mg  TID PRN nausea, #30

## 2013-07-17 ENCOUNTER — Ambulatory Visit (INDEPENDENT_AMBULATORY_CARE_PROVIDER_SITE_OTHER): Payer: Medicare Other | Admitting: Internal Medicine

## 2013-07-17 ENCOUNTER — Encounter: Payer: Self-pay | Admitting: Internal Medicine

## 2013-07-17 VITALS — BP 137/70 | HR 65 | Temp 98.7°F | Wt 155.0 lb

## 2013-07-17 DIAGNOSIS — B9789 Other viral agents as the cause of diseases classified elsewhere: Secondary | ICD-10-CM

## 2013-07-17 DIAGNOSIS — B349 Viral infection, unspecified: Secondary | ICD-10-CM

## 2013-07-17 NOTE — Patient Instructions (Signed)
Rest, fluids. Call anytime if the symptoms resurface

## 2013-07-17 NOTE — Progress Notes (Signed)
Pre visit review using our clinic review tool, if applicable. No additional management support is needed unless otherwise documented below in the visit note. 
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
## 2013-08-28 ENCOUNTER — Other Ambulatory Visit: Payer: Self-pay | Admitting: Family Medicine

## 2013-08-28 DIAGNOSIS — Z1231 Encounter for screening mammogram for malignant neoplasm of breast: Secondary | ICD-10-CM

## 2013-09-09 ENCOUNTER — Ambulatory Visit (HOSPITAL_COMMUNITY): Payer: Medicare Other

## 2013-09-10 ENCOUNTER — Ambulatory Visit (HOSPITAL_COMMUNITY)
Admission: RE | Admit: 2013-09-10 | Discharge: 2013-09-10 | Disposition: A | Discharge status: Home / Self Care | Payer: Medicare Other | Source: Ambulatory Visit | Attending: Family Medicine | Admitting: Family Medicine

## 2013-09-10 DIAGNOSIS — Z1231 Encounter for screening mammogram for malignant neoplasm of breast: Secondary | ICD-10-CM

## 2013-12-25 ENCOUNTER — Ambulatory Visit (INDEPENDENT_AMBULATORY_CARE_PROVIDER_SITE_OTHER): Payer: Medicare Other | Admitting: Family Medicine

## 2013-12-25 ENCOUNTER — Encounter: Payer: Self-pay | Admitting: Family Medicine

## 2013-12-25 VITALS — BP 140/80 | HR 59 | Temp 98.0°F | Resp 16 | Ht 66.0 in | Wt 150.1 lb

## 2013-12-25 DIAGNOSIS — E785 Hyperlipidemia, unspecified: Secondary | ICD-10-CM

## 2013-12-25 DIAGNOSIS — Z0000 Encounter for general adult medical examination without abnormal findings: Secondary | ICD-10-CM

## 2013-12-25 DIAGNOSIS — F329 Major depressive disorder, single episode, unspecified: Secondary | ICD-10-CM

## 2013-12-25 DIAGNOSIS — F1010 Alcohol abuse, uncomplicated: Secondary | ICD-10-CM

## 2013-12-25 DIAGNOSIS — Z23 Encounter for immunization: Secondary | ICD-10-CM

## 2013-12-25 DIAGNOSIS — I10 Essential (primary) hypertension: Secondary | ICD-10-CM

## 2013-12-25 DIAGNOSIS — F1011 Alcohol abuse, in remission: Secondary | ICD-10-CM

## 2013-12-25 DIAGNOSIS — Z01419 Encounter for gynecological examination (general) (routine) without abnormal findings: Secondary | ICD-10-CM

## 2013-12-25 DIAGNOSIS — F32A Depression, unspecified: Secondary | ICD-10-CM

## 2013-12-25 LAB — CBC WITH DIFFERENTIAL/PLATELET
BASOS PCT: 0.7 % (ref 0.0–3.0)
Basophils Absolute: 0.1 10*3/uL (ref 0.0–0.1)
EOS PCT: 2.9 % (ref 0.0–5.0)
Eosinophils Absolute: 0.2 10*3/uL (ref 0.0–0.7)
HCT: 45.1 % (ref 36.0–46.0)
Hemoglobin: 14.6 g/dL (ref 12.0–15.0)
Lymphocytes Relative: 41.9 % (ref 12.0–46.0)
Lymphs Abs: 3.1 10*3/uL (ref 0.7–4.0)
MCHC: 32.3 g/dL (ref 30.0–36.0)
MCV: 87.1 fl (ref 78.0–100.0)
MONO ABS: 0.5 10*3/uL (ref 0.1–1.0)
MONOS PCT: 6.1 % (ref 3.0–12.0)
NEUTROS PCT: 48.4 % (ref 43.0–77.0)
Neutro Abs: 3.6 10*3/uL (ref 1.4–7.7)
Platelets: 323 10*3/uL (ref 150.0–400.0)
RBC: 5.18 Mil/uL — AB (ref 3.87–5.11)
RDW: 13.8 % (ref 11.5–15.5)
WBC: 7.5 10*3/uL (ref 4.0–10.5)

## 2013-12-25 NOTE — Patient Instructions (Signed)
Follow up in 6 months to recheck BP and cholesterol We'll notify you of your lab results and make any changes if needed Please complete the Cologuard and return as directed Keep up the good work on healthy diet and regular exercise Call with any questions or concerns Happy Fall! Hang in there!!!

## 2013-12-25 NOTE — Assessment & Plan Note (Signed)
Chronic problem.  Adequate control.  Asymptomatic.  Check labs.  No anticipated med changes.  Will follow. 

## 2013-12-25 NOTE — Assessment & Plan Note (Signed)
Chronic problem, following w/ Dr Toy Care.  Reports sxs are well controlled.  No med changes at this time.  Will follow.

## 2013-12-25 NOTE — Progress Notes (Signed)
Pre visit review using our clinic review tool, if applicable. No additional management support is needed unless otherwise documented below in the visit note. 

## 2013-12-25 NOTE — Assessment & Plan Note (Signed)
Pt has been sober for 6 yrs.  Applauded her efforts.  Pt has not had Hep B or C screening to her knowledge despite her polysubstance abuse in the past.  Will get labs today.

## 2013-12-25 NOTE — Assessment & Plan Note (Signed)
Chronic problem.  Pt has been resistant to cholesterol meds in the past.  Not currently on statin.  Check labs.  Again discuss starting meds prn.

## 2013-12-25 NOTE — Progress Notes (Signed)
   Subjective:    Patient ID: Tammy Stevens, female    DOB: Mar 02, 1946, 68 y.o.   MRN: [id number]  HPI Here today for CPE.  Risk Factors: HTN- chronic problem, on HCTZ.  Adequate control. Hyperlipidemia- chronic problem.  Pt has been resistant to medication in the past. Physical Activity: not exercising regularly Fall Risk: low Depression: chronic problem for pt, seeing psychiatry (Dr Toy Care).  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  Remains clean and sober- 6 yrs  Hearing: normal to conversational tones and whispered voice at 6 ft ADL's: independent Cognitive: normal linear thought process, memory and attention intact Home Safety: safe at home, living alone.  Daughter is local Height, Weight, BMI, Visual Acuity: see vitals, vision corrected to 20/20 w/ glasses Counseling: no longer requiring paps due to age.  UTD on mammo, DEXA.  Due for colonoscopy- 'i have post traumatic something about that area'.  Pt agreeable to cologuard. Labs Ordered: See A&P Care Plan: See A&P    Review of Systems Patient reports no vision/ hearing changes, adenopathy,fever, weight change,  persistant/recurrent hoarseness , swallowing issues, chest pain, palpitations, edema, persistant/recurrent cough, hemoptysis, dyspnea (rest/exertional/paroxysmal nocturnal), gastrointestinal bleeding (melena, rectal bleeding), abdominal pain, significant heartburn, bowel changes, GU symptoms (dysuria, hematuria, incontinence), Gyn symptoms (abnormal  bleeding, pain),  syncope, focal weakness, memory loss, numbness & tingling, skin/hair/nail changes, abnormal bruising or bleeding, anxiety, or depression.     Objective:   Physical Exam  General Appearance:    Alert, cooperative, no distress, appears stated age  Head:    Normocephalic, without obvious abnormality, atraumatic  Eyes:    PERRL, conjunctiva/corneas clear, EOM's intact, fundi    benign, both eyes  Ears:    Normal TM's and external ear canals, both  ears  Nose:   Nares normal, septum midline, mucosa normal, no drainage    or sinus tenderness  Throat:   Lips, mucosa, and tongue normal; teeth and gums normal  Neck:   Supple, symmetrical, trachea midline, no adenopathy;    Thyroid: no enlargement/tenderness/nodules  Back:     Symmetric, no curvature, ROM normal, no CVA tenderness  Lungs:     Clear to auscultation bilaterally, respirations unlabored  Chest Wall:    No tenderness or deformity   Heart:    Regular rate and rhythm, S1 and S2 normal, no murmur, rub   or gallop  Breast Exam:    No tenderness, masses, or nipple abnormality  Abdomen:     Soft, non-tender, bowel sounds active all four quadrants,    no masses, no organomegaly  Genitalia:    Deferred at pt request  Rectal:    Extremities:   Extremities normal, atraumatic, no cyanosis or edema  Pulses:   2+ and symmetric all extremities  Skin:   Skin color, texture, turgor normal, no rashes or lesions  Lymph nodes:   Cervical, supraclavicular, and axillary nodes normal  Neurologic:   CNII-XII intact, normal strength, sensation and reflexes    throughout          Assessment & Plan:

## 2013-12-25 NOTE — Assessment & Plan Note (Signed)
Pt's PE WNL.  UTD on mammo and DEXA.  No need for paps.  Encouraged colonoscopy but pt refused.  Will do cologuard instead.  Written screening schedule updated and given to pt.  Check labs.  Anticipatory guidance provided.

## 2013-12-26 LAB — LIPID PANEL
CHOL/HDL RATIO: 4
Cholesterol: 268 mg/dL — ABNORMAL HIGH (ref 0–200)
HDL: 65 mg/dL (ref >39.00)
LDL Cholesterol: 176 mg/dL — ABNORMAL HIGH (ref 0–99)
NONHDL: 203
Triglycerides: 133 mg/dL (ref 0.0–149.0)
VLDL: 26.6 mg/dL (ref 0.0–40.0)

## 2013-12-26 LAB — BASIC METABOLIC PANEL
BUN: 16 mg/dL (ref 6–23)
CALCIUM: 10.4 mg/dL (ref 8.4–10.5)
CO2: 26 meq/L (ref 19–32)
CREATININE: 1.1 mg/dL (ref 0.4–1.2)
Chloride: 101 mEq/L (ref 96–112)
GFR: 55.51 mL/min — ABNORMAL LOW (ref >60.00)
Glucose, Bld: 76 mg/dL (ref 70–99)
Potassium: 4.2 mEq/L (ref 3.5–5.1)
Sodium: 139 mEq/L (ref 135–145)

## 2013-12-26 LAB — HEPATIC FUNCTION PANEL
ALBUMIN: 4.3 g/dL (ref 3.5–5.2)
ALT: 18 U/L (ref 0–35)
AST: 27 U/L (ref 0–37)
Alkaline Phosphatase: 94 U/L (ref 39–117)
BILIRUBIN TOTAL: 0.6 mg/dL (ref 0.2–1.2)
Bilirubin, Direct: 0 mg/dL (ref 0.0–0.3)
Total Protein: 8.3 g/dL (ref 6.0–8.3)

## 2013-12-26 LAB — HEPATITIS C ANTIBODY: HCV Ab: NEGATIVE

## 2013-12-26 LAB — HEPATITIS B CORE ANTIBODY, TOTAL: HEP B C TOTAL AB: NONREACTIVE

## 2013-12-26 LAB — TSH: TSH: 0.77 u[IU]/mL (ref 0.35–4.50)

## 2014-01-01 ENCOUNTER — Other Ambulatory Visit: Payer: Self-pay | Admitting: General Practice

## 2014-01-01 MED ORDER — HYDROCHLOROTHIAZIDE 12.5 MG PO CAPS: ORAL_CAPSULE | ORAL | Status: DC

## 2014-01-09 ENCOUNTER — Telehealth: Payer: Self-pay | Admitting: Cardiology

## 2014-01-09 NOTE — Telephone Encounter (Signed)
Returned call to patient she stated she has not seen Dr.Jordan in a long time over 3 years.Stated she was told to return if needed.Stated Dr.Jordan ordered a a sleep study and she was diagnosed with sleep apnea.Stated she quit wearing cpap for over a year and started wearing again 6 months ago.Stated she is having trouble with the cpap machine.Advised to call her PCP.

## 2014-01-09 NOTE — Telephone Encounter (Signed)
Please call,concerning her last office visit with Dr Martinique.

## 2014-01-22 LAB — COLOGUARD

## 2014-02-07 ENCOUNTER — Telehealth: Payer: Self-pay | Admitting: General Practice

## 2014-02-07 ENCOUNTER — Encounter: Payer: Self-pay | Admitting: Internal Medicine

## 2014-02-07 DIAGNOSIS — Z1211 Encounter for screening for malignant neoplasm of colon: Secondary | ICD-10-CM

## 2014-02-07 NOTE — Telephone Encounter (Signed)
Received a fax from Tammy Stevens stating that the Pt had a positive result from screening. Per papers this results does not indicate cancer, just the possible presence of colorectal cancer or advanced adenoma. Per Provider pt was notified of this and a GI referral was placed per the provider. Pt aware and agrees to this referral.

## 2014-03-10 ENCOUNTER — Encounter: Payer: Self-pay | Admitting: Family Medicine

## 2014-03-17 DIAGNOSIS — N8111 Cystocele, midline: Secondary | ICD-10-CM | POA: Diagnosis not present

## 2014-03-17 DIAGNOSIS — M62838 Other muscle spasm: Secondary | ICD-10-CM | POA: Diagnosis not present

## 2014-03-17 DIAGNOSIS — M6281 Muscle weakness (generalized): Secondary | ICD-10-CM | POA: Diagnosis not present

## 2014-03-17 DIAGNOSIS — R278 Other lack of coordination: Secondary | ICD-10-CM | POA: Diagnosis not present

## 2014-03-18 ENCOUNTER — Telehealth: Payer: Self-pay | Admitting: Family Medicine

## 2014-03-18 DIAGNOSIS — Z9989 Dependence on other enabling machines and devices: Principal | ICD-10-CM

## 2014-03-18 DIAGNOSIS — G4733 Obstructive sleep apnea (adult) (pediatric): Secondary | ICD-10-CM

## 2014-03-18 NOTE — Telephone Encounter (Signed)
Caller name:Tammy Stevens Relation to MW:UXLK Call back number:[phone] Pharmacy:  Reason for call: pt would like for you to give her a call, states she is needing a new rx to have her c-pap machine adjusted but states they informed her that she needs a new rx and referral again, pt states she does not remember who the doctor was but it should be in her chart.

## 2014-03-18 NOTE — Telephone Encounter (Signed)
Pt was last seen 12/25/13. I do not see a pulmonary provide in the chart. Ok for a new referral or does =pt need to come in again.

## 2014-03-18 NOTE — Telephone Encounter (Signed)
Pt has seen Dr Halford Chessman in the past ([facility]).  Ok to refer

## 2014-03-19 NOTE — Telephone Encounter (Signed)
Referral placed.

## 2014-04-03 ENCOUNTER — Ambulatory Visit (AMBULATORY_SURGERY_CENTER): Payer: Self-pay | Admitting: *Deleted

## 2014-04-03 VITALS — Ht 66.0 in | Wt 152.4 lb

## 2014-04-03 DIAGNOSIS — Z1211 Encounter for screening for malignant neoplasm of colon: Secondary | ICD-10-CM

## 2014-04-03 MED ORDER — MOVIPREP 100 G PO SOLR: 1.0000 | Freq: Once | ORAL | Status: DC
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
## 2014-04-04 ENCOUNTER — Other Ambulatory Visit: Payer: Self-pay | Admitting: Family Medicine

## 2014-04-04 NOTE — Telephone Encounter (Signed)
Med filled.  

## 2014-04-09 DIAGNOSIS — R399 Unspecified symptoms and signs involving the genitourinary system: Secondary | ICD-10-CM | POA: Diagnosis not present

## 2014-04-09 DIAGNOSIS — M6281 Muscle weakness (generalized): Secondary | ICD-10-CM | POA: Diagnosis not present

## 2014-04-09 DIAGNOSIS — M62838 Other muscle spasm: Secondary | ICD-10-CM | POA: Diagnosis not present

## 2014-04-09 DIAGNOSIS — R278 Other lack of coordination: Secondary | ICD-10-CM | POA: Diagnosis not present

## 2014-04-11 ENCOUNTER — Encounter: Payer: Self-pay | Admitting: Internal Medicine

## 2014-04-17 ENCOUNTER — Ambulatory Visit (INDEPENDENT_AMBULATORY_CARE_PROVIDER_SITE_OTHER): Payer: Medicare Other | Admitting: Medical

## 2014-04-17 ENCOUNTER — Encounter: Payer: Self-pay | Admitting: Medical

## 2014-04-17 VITALS — BP 121/81 | HR 80 | Temp 98.2°F | Ht 66.0 in | Wt 153.2 lb

## 2014-04-17 DIAGNOSIS — R51 Headache: Secondary | ICD-10-CM

## 2014-04-17 DIAGNOSIS — R531 Weakness: Secondary | ICD-10-CM

## 2014-04-17 DIAGNOSIS — B349 Viral infection, unspecified: Secondary | ICD-10-CM

## 2014-04-17 DIAGNOSIS — J029 Acute pharyngitis, unspecified: Secondary | ICD-10-CM | POA: Diagnosis not present

## 2014-04-17 DIAGNOSIS — R519 Headache, unspecified: Secondary | ICD-10-CM | POA: Insufficient documentation

## 2014-04-17 LAB — POCT INFLUENZA A/B
Influenza A, POC: NEGATIVE
Influenza B, POC: NEGATIVE

## 2014-04-17 LAB — POCT RAPID STREP A (OFFICE): Rapid Strep A Screen: NEGATIVE

## 2014-04-17 MED ORDER — KETOROLAC TROMETHAMINE 60 MG/2ML IM SOLN
60.0000 mg | Freq: Once | INTRAMUSCULAR | Status: AC
Start: 2014-04-17 — End: 2014-04-17
  Administered 2014-04-17: 60 mg via INTRAMUSCULAR

## 2014-04-17 MED ORDER — FLUTICASONE PROPIONATE 50 MCG/ACT NA SUSP: 2.0000 | Freq: Every day | NASAL | Status: DC

## 2014-04-17 MED ORDER — BENZONATATE 100 MG PO CAPS: 100.0000 mg | ORAL_CAPSULE | Freq: Three times a day (TID) | ORAL | Status: DC | PRN

## 2014-04-17 MED ORDER — OSELTAMIVIR PHOSPHATE 75 MG PO CAPS: 75.0000 mg | ORAL_CAPSULE | Freq: Two times a day (BID) | ORAL | Status: DC

## 2014-04-17 MED ORDER — CEPHALEXIN 500 MG PO CAPS: 500.0000 mg | ORAL_CAPSULE | Freq: Two times a day (BID) | ORAL | Status: DC

## 2014-04-17 NOTE — Progress Notes (Signed)
Pre visit review using our clinic review tool, if applicable. No additional management support is needed unless otherwise documented below in the visit note. 

## 2014-04-17 NOTE — Assessment & Plan Note (Signed)
You appear to have the flu even though your  rapid flu test was negative.(Important to note sometimes flu test can be falsely negative.) Therefore,  I am treating you with tamiflu based on your clinical presentation. Rest, hydrate and take tylenol for fever. Alternate ibuprofen(starting tomorrow)  if necessary for body ache or fever.

## 2014-04-17 NOTE — Assessment & Plan Note (Signed)
toradol im in office.
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
## 2014-04-17 NOTE — Patient Instructions (Addendum)
Viral syndrome You appear to have the flu even though your  rapid flu test was negative.(Important to note sometimes flu test can be falsely negative.) Therefore,  I am treating you with tamiflu based on your clinical presentation. Rest, hydrate and take tylenol for fever. Alternate ibuprofen(starting tomorrow)  if necessary for body ache or fever.        Acute pharyngitis With all your symptoms I will also give you keflex antibiotic. Since strep symptom can sometimes mimic the flu.   Headache around the eyes toradol im in office.     If your ha worsens/ does not improve or any other associated neurologic symptoms(or neck stiffness) then ED evaluation.  Follow up in 7 days or as needed.

## 2014-04-17 NOTE — Assessment & Plan Note (Addendum)
With all your symptoms I will also give you keflex antibiotic. Since strep symptom can sometimes mimic the flu.

## 2014-04-23 ENCOUNTER — Institutional Professional Consult (permissible substitution): Payer: Medicare Other | Admitting: Pulmonary Disease

## 2014-05-02 DIAGNOSIS — H52223 Regular astigmatism, bilateral: Secondary | ICD-10-CM | POA: Diagnosis not present

## 2014-05-02 DIAGNOSIS — H2513 Age-related nuclear cataract, bilateral: Secondary | ICD-10-CM | POA: Diagnosis not present

## 2014-05-02 DIAGNOSIS — H524 Presbyopia: Secondary | ICD-10-CM | POA: Diagnosis not present

## 2014-05-02 DIAGNOSIS — H04123 Dry eye syndrome of bilateral lacrimal glands: Secondary | ICD-10-CM | POA: Diagnosis not present

## 2014-05-09 ENCOUNTER — Encounter: Payer: Self-pay | Admitting: Internal Medicine

## 2014-05-13 ENCOUNTER — Telehealth: Payer: Self-pay | Admitting: Internal Medicine

## 2014-05-13 MED ORDER — DIAZEPAM 5 MG PO TABS: ORAL_TABLET | ORAL | Status: DC
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
## 2014-05-13 NOTE — Telephone Encounter (Signed)
Called patient and advised her that Dr. Olevia Perches sent in Rx. For Valium for her to take prior to procedure tomorrow.

## 2014-05-13 NOTE — Telephone Encounter (Signed)
Patient notified that rx is being faxed to pharmacy.

## 2014-05-13 NOTE — Telephone Encounter (Signed)
Please send Valium 5 mg, #3 tab, take 1 tab 1 hour before the procedure.

## 2014-05-14 ENCOUNTER — Ambulatory Visit (AMBULATORY_SURGERY_CENTER): Payer: Medicare Other | Admitting: Internal Medicine

## 2014-05-14 ENCOUNTER — Encounter: Payer: Self-pay | Admitting: Internal Medicine

## 2014-05-14 VITALS — BP 120/62 | HR 50 | Temp 96.0°F | Resp 18 | Ht 66.0 in | Wt 152.0 lb

## 2014-05-14 DIAGNOSIS — Z1211 Encounter for screening for malignant neoplasm of colon: Secondary | ICD-10-CM | POA: Diagnosis not present

## 2014-05-14 DIAGNOSIS — D125 Benign neoplasm of sigmoid colon: Secondary | ICD-10-CM | POA: Diagnosis not present

## 2014-05-14 MED ORDER — SODIUM CHLORIDE 0.9 % IV SOLN: 500.0000 mL | INTRAVENOUS | Status: DC

## 2014-05-14 NOTE — Patient Instructions (Addendum)
YOU HAD AN ENDOSCOPIC PROCEDURE TODAY AT [facility]:   Refer to the procedure report that was given to you for any specific questions about what was found during the examination.  If the procedure report does not answer your questions, please call your gastroenterologist to clarify.  If you requested that your care partner not be given the details of your procedure findings, then the procedure report has been included in a sealed envelope for you to review at your convenience later.  YOU SHOULD EXPECT: Some feelings of bloating in the abdomen. Passage of more gas than usual.  Walking can help get rid of the air that was put into your GI tract during the procedure and reduce the bloating. If you had a lower endoscopy (such as a colonoscopy or flexible sigmoidoscopy) you may notice spotting of blood in your stool or on the toilet paper. If you underwent a bowel prep for your procedure, you may not have a normal bowel movement for a few days.  Please Note:  You might notice some irritation and congestion in your nose or some drainage.  This is from the oxygen used during your procedure.  There is no need for concern and it should clear up in a day or so.  SYMPTOMS TO REPORT IMMEDIATELY:   Following lower endoscopy (colonoscopy or flexible sigmoidoscopy):  Excessive amounts of blood in the stool  Significant tenderness or worsening of abdominal pains  Swelling of the abdomen that is new, acute  Fever of 100F or higher   For urgent or emergent issues, a gastroenterologist can be reached at any hour by calling [phone].   DIET: Your first meal following the procedure should be a small meal and then it is ok to progress to your normal diet. Heavy or fried foods are harder to digest and may make you feel nauseous or bloated.  Likewise, meals heavy in dairy and vegetables can increase bloating.  Drink plenty of fluids but you should avoid alcoholic beverages for 24  hours.  ACTIVITY:  You should plan to take it easy for the rest of today and you should NOT DRIVE or use heavy machinery until tomorrow (because of the sedation medicines used during the test).    FOLLOW UP: Our staff will call the number listed on your records the next business day following your procedure to check on you and address any questions or concerns that you may have regarding the information given to you following your procedure. If we do not reach you, we will leave a message.  However, if you are feeling well and you are not experiencing any problems, there is no need to return our call.  We will assume that you have returned to your regular daily activities without incident.  If any biopsies were taken you will be contacted by phone or by letter within the next 1-3 weeks.  Please call us at [phone] if you have not heard about the biopsies in 3 weeks.    SIGNATURES/CONFIDENTIALITY: You and/or your care partner have signed paperwork which will be entered into your electronic medical record.  These signatures attest to the fact that that the information above on your After Visit Summary has been reviewed and is understood.  Full responsibility of the confidentiality of this discharge information lies with you and/or your care-partner.  Polyps, diverticulosis, high fiber diet-handouts given  Repeat colonoscopy will be determined by pathology.  Avoid aspirin, aspirin products and anti-inflammatory medications (ibuprofen, motrin,  advil, aleve, naproxen, mobic, etc) for 14 days.

## 2014-05-14 NOTE — Progress Notes (Signed)
A/ox3, pleased with MAC, report to RN 

## 2014-05-14 NOTE — Progress Notes (Signed)
Called to room to assist during endoscopic procedure.  Patient ID and intended procedure confirmed with present staff. Received instructions for my participation in the procedure from the performing physician.  

## 2014-05-14 NOTE — Op Note (Signed)
[facility]  [employer] [place], [address]   COLONOSCOPY PROCEDURE REPORT  PATIENT: Tammy, Stevens  MR#: [id number] BIRTHDATE: 1945/07/18 , 39  yrs. old GENDER: female ENDOSCOPIST: Lafayette Dragon, MD REFERRED [id number] Wadie Lessen, M.D. PROCEDURE DATE:  05/14/2014 PROCEDURE:   Colonoscopy, screening First Screening Colonoscopy - Avg.  risk and is 50 yrs.  old or older - No.  Prior Negative Screening - Now for repeat screening. 10 or more years since last screening  History of Adenoma - Now for follow-up colonoscopy & has been > or = to 3 yrs.  N/A  Polyps Removed Today? Yes. ASA CLASS:   Class II INDICATIONS:Screening for colonic neoplasia, Average Risk, and prior colonoscopy more than 20 years ago in another facility.  No records available. MEDICATIONS: Monitored anesthesia care and Propofol 350 mg IV  DESCRIPTION OF PROCEDURE:   After the risks benefits and alternatives of the procedure were thoroughly explained, informed consent was obtained.  The digital rectal exam revealed no abnormalities of the rectum.   The LB PFC-H190 [id number]  endoscope was introduced through the anus and advanced to the cecum, which was identified by both the appendix and ileocecal valve. No adverse events experienced.   The quality of the prep was good.  (MoviPrep was used)  The instrument was then slowly withdrawn as the colon was fully examined.      COLON FINDINGS: Two semi-pedunculated polyps measuring 21 mm anf 8 mm in size were found in the sigmoid colon.at 25 and 20 cm resp.  A polypectomy was performed using snare cautery.at 20 cm and cold snare at 25 cm  The resection was complete, the polyp tissue was completely retrieved and sent to histology.    There was moderate diverticulosis noted in the sigmoid colon with associated muscular hypertrophy, angulation and tortuosity.  Retroflexed views revealed no abnormalities. The time to cecum = -21.4 Withdrawal time =  12.0 0.min   The scope was withdrawn and the procedure completed.  COMPLICATIONS: There were no immediate complications.  ENDOSCOPIC IMPRESSION: 1.   Two semi-pedunculated and sessile  polyps were found in the sigmoid colon at 25 cm and 20 cm resp; polypectomy was performed using snare cautery at 25 cm; polypectomy was performed with cold forceps; at 20 cm 2.   There was moderate diverticulosis noted in the sigmoid colon  RECOMMENDATIONS: 1.  Await pathology results 2.  High fiber diet 3.avoid aspirin and anti-inflammatory agents for 2 weeks 4.Recall colonoscopy pending path report  eSigned:  Lafayette Dragon, MD 05/14/2014 11:17 AM   cc:   PATIENT NAME:  Tammy, Stevens MR#: [id number]

## 2014-05-15 ENCOUNTER — Telehealth: Payer: Self-pay | Admitting: *Deleted

## 2014-05-15 NOTE — Telephone Encounter (Signed)
  Follow up Call-  Call back number 05/14/2014  Post procedure Call Back phone  # [phone]  Permission to leave phone message Yes     Patient questions:  Do you have a fever, pain , or abdominal swelling? No. Pain Score  0 *  Have you tolerated food without any problems? Yes.    Have you been able to return to your normal activities? Yes.    Do you have any questions about your discharge instructions: Diet   No. Medications  No. Follow up visit  No.  Do you have questions or concerns about your Care? No.  Actions: * If pain score is 4 or above: No action needed, pain <4.

## 2014-05-19 ENCOUNTER — Encounter: Payer: Self-pay | Admitting: Internal Medicine

## 2014-06-03 ENCOUNTER — Institutional Professional Consult (permissible substitution): Payer: Medicare Other | Admitting: Pulmonary Disease

## 2014-06-23 ENCOUNTER — Other Ambulatory Visit: Payer: Self-pay | Admitting: Family Medicine

## 2014-06-23 NOTE — Telephone Encounter (Signed)
Med filled.  

## 2014-07-01 ENCOUNTER — Ambulatory Visit: Payer: Medicare Other | Admitting: Family Medicine

## 2014-07-04 ENCOUNTER — Telehealth: Payer: Self-pay | Admitting: Family Medicine

## 2014-07-04 NOTE — Telephone Encounter (Signed)
Recd vm from pt to cancel appt 07/01/14 10:45am. Pt states she will call later to reschedule. Received msg 06/30/14 at 4:49pm.

## 2014-07-09 ENCOUNTER — Other Ambulatory Visit: Payer: Self-pay | Admitting: Medical

## 2014-07-11 ENCOUNTER — Institutional Professional Consult (permissible substitution): Payer: Medicare Other | Admitting: Pulmonary Disease

## 2014-07-16 ENCOUNTER — Institutional Professional Consult (permissible substitution): Payer: Medicare Other | Admitting: Pulmonary Disease

## 2014-08-29 ENCOUNTER — Other Ambulatory Visit: Payer: Self-pay | Admitting: Medical

## 2014-09-09 ENCOUNTER — Ambulatory Visit (INDEPENDENT_AMBULATORY_CARE_PROVIDER_SITE_OTHER): Payer: Medicare Other | Admitting: Family Medicine

## 2014-09-09 ENCOUNTER — Encounter: Payer: Self-pay | Admitting: Family Medicine

## 2014-09-09 VITALS — BP 122/82 | HR 72 | Temp 98.6°F | Resp 16 | Wt 157.4 lb

## 2014-09-09 DIAGNOSIS — W57XXXA Bitten or stung by nonvenomous insect and other nonvenomous arthropods, initial encounter: Secondary | ICD-10-CM | POA: Diagnosis not present

## 2014-09-09 DIAGNOSIS — T148 Other injury of unspecified body region: Secondary | ICD-10-CM

## 2014-09-09 DIAGNOSIS — L259 Unspecified contact dermatitis, unspecified cause: Secondary | ICD-10-CM | POA: Insufficient documentation

## 2014-09-09 DIAGNOSIS — L989 Disorder of the skin and subcutaneous tissue, unspecified: Secondary | ICD-10-CM | POA: Diagnosis not present

## 2014-09-09 MED ORDER — TRIAMCINOLONE ACETONIDE 0.1 % EX OINT: 1.0000 "application " | TOPICAL_OINTMENT | Freq: Two times a day (BID) | CUTANEOUS | Status: DC

## 2014-09-09 MED ORDER — PREDNISONE 10 MG PO TABS: ORAL_TABLET | ORAL | Status: DC

## 2014-09-09 NOTE — Patient Instructions (Signed)
Follow up as needed We'll call you with your dermatology appt for the toe Apply the triamcinolone ointment on the foot and wrist to help w/ the itching Continue cool compresses on foot and cheek Take the Prednisone as directed- 3 for 3 days, then 2 for 3 days, and then 1 for 3 days.  Take w/ food Benadryl as needed for itching at night- will cause drowsiness Call with any questions or concerns Hang in there!!

## 2014-09-09 NOTE — Progress Notes (Signed)
Pre visit review using our clinic review tool, if applicable. No additional management support is needed unless otherwise documented below in the visit note. 

## 2014-09-09 NOTE — Progress Notes (Signed)
   Subjective:    Patient ID: Tammy Stevens, female    DOB: 08-11-1945, 69 y.o.   MRN: [id number]  HPI Facial redness- pt reports that 2 days ago she developed bumps and redness on L side of face.  + itching.  Was pulling weeds earlier in the day  R foot swelling- pt had stinging pain in top of foot and subsequently had extensive redness and swelling.  Very itchy.  Still swollen despite applying ice.  R wrist lesion- pt reports 5 months ago had 2 small puncture wounds consistent w/ spider bite.  Area continues to be raised and itchy.  Growth on L 2nd toe- 'it's ugly'.  Not painful.  Has thickened skin on top.   Review of Systems For ROS see HPI     Objective:   Physical Exam  Constitutional: She appears well-developed and well-nourished. No distress.  HENT:  Head: Normocephalic and atraumatic.  L facial redness, warmth and mild edema  Musculoskeletal: She exhibits edema (on dorsum of R foot w/ mild redness). She exhibits no tenderness.  Scar formation over site of previous bite on R wrist.  No redness or swelling, + itching w/ palpation  Lymphadenopathy:       Head (right side): No submental, no submandibular, no tonsillar, no preauricular and no posterior auricular adenopathy present.       Head (left side): Submental adenopathy present. No submandibular, no tonsillar, no preauricular and no posterior auricular adenopathy present.  Neurological: She is alert.  Skin: Skin is warm and dry. There is erythema (on L face, over dorsum of R foot).  Thickened, keratotic wartlike lesion on 2nd toe on L foot  Vitals reviewed.         Assessment & Plan:

## 2014-09-21 NOTE — Assessment & Plan Note (Signed)
New.  Keratotic area on toe.  Will need to be removed by derm- referral placed.  Pt expressed understanding and is in agreement w/ plan.

## 2014-09-21 NOTE — Assessment & Plan Note (Signed)
New.  On foot and on face.  Start pred taper.  Reviewed supportive care and red flags that should prompt return.  Pt expressed understanding and is in agreement w/ plan.

## 2014-09-21 NOTE — Assessment & Plan Note (Signed)
New to provider, ongoing issue for pt.  Area appears to be scared at this point but continues to itch due to hypertrophied skin.  Start topical steroid ointment.  Reviewed supportive care and red flags that should prompt return.  Pt expressed understanding and is in agreement w/ plan.

## 2014-11-21 ENCOUNTER — Other Ambulatory Visit: Payer: Self-pay | Admitting: Medical

## 2014-11-21 NOTE — Telephone Encounter (Signed)
Refill of pt nasal steroid.

## 2014-12-03 ENCOUNTER — Telehealth: Payer: Self-pay | Admitting: Family Medicine

## 2014-12-03 NOTE — Telephone Encounter (Signed)
Nevin Bloodgood, NP with [facility]  Ph# [phone]  Finding to report: Pt feels she's had changes in congnitive function and memory. She wanted to make you aware to discuss during next follow up. Nevin Bloodgood did not notice anything concerning but wanted to relay the message.

## 2014-12-04 NOTE — Telephone Encounter (Signed)
Called and spoke with pt, she wants to wait until her appt in December to discuss. She stated that if symptoms change or she becomes more concerned she will call and schedule before then.

## 2014-12-04 NOTE — Telephone Encounter (Signed)
FYI

## 2014-12-04 NOTE — Telephone Encounter (Signed)
If pt is concerned, she can schedule an appt

## 2014-12-30 ENCOUNTER — Other Ambulatory Visit: Payer: Self-pay | Admitting: Family Medicine

## 2014-12-30 NOTE — Telephone Encounter (Signed)
Medication filled to pharmacy as requested.   

## 2015-02-06 ENCOUNTER — Telehealth: Payer: Self-pay

## 2015-02-06 NOTE — Telephone Encounter (Signed)
Pre Visit call completed. 

## 2015-02-09 ENCOUNTER — Encounter: Payer: Self-pay | Admitting: Family Medicine

## 2015-02-09 ENCOUNTER — Ambulatory Visit (INDEPENDENT_AMBULATORY_CARE_PROVIDER_SITE_OTHER): Payer: Medicare Other | Admitting: Family Medicine

## 2015-02-09 VITALS — BP 118/80 | HR 68 | Temp 98.0°F | Resp 16 | Ht 66.0 in | Wt 157.0 lb

## 2015-02-09 DIAGNOSIS — Z0000 Encounter for general adult medical examination without abnormal findings: Secondary | ICD-10-CM

## 2015-02-09 DIAGNOSIS — E785 Hyperlipidemia, unspecified: Secondary | ICD-10-CM

## 2015-02-09 DIAGNOSIS — Z85828 Personal history of other malignant neoplasm of skin: Secondary | ICD-10-CM

## 2015-02-09 DIAGNOSIS — N8110 Cystocele, unspecified: Secondary | ICD-10-CM

## 2015-02-09 DIAGNOSIS — IMO0002 Reserved for concepts with insufficient information to code with codable children: Secondary | ICD-10-CM

## 2015-02-09 DIAGNOSIS — F319 Bipolar disorder, unspecified: Secondary | ICD-10-CM | POA: Insufficient documentation

## 2015-02-09 DIAGNOSIS — Z1231 Encounter for screening mammogram for malignant neoplasm of breast: Secondary | ICD-10-CM

## 2015-02-09 DIAGNOSIS — F3160 Bipolar disorder, current episode mixed, unspecified: Secondary | ICD-10-CM

## 2015-02-09 DIAGNOSIS — Z780 Asymptomatic menopausal state: Secondary | ICD-10-CM

## 2015-02-09 DIAGNOSIS — I10 Essential (primary) hypertension: Secondary | ICD-10-CM

## 2015-02-09 DIAGNOSIS — G4733 Obstructive sleep apnea (adult) (pediatric): Secondary | ICD-10-CM

## 2015-02-09 NOTE — Progress Notes (Signed)
   Subjective:    Patient ID: Tammy Stevens, female    DOB: [birth year], 69 y.o.   MRN: [id number]  HPI Here today for CPE.  Risk Factors: HTN- chronic problem, on HCTZ w/ good BP control. Hyperlipidemia- not currently on cholesterol medication, 'unless there's something that doesn't have side effects and everything has side effects'. OSA- no longer wearing CPAP, 'i cannot mentally bear it.  i'm doing fine'.  Has not worn in over 1 year Physical Activity: no formal exercise Fall Risk: low Depression: chronic problem, pt has hx of bipolar.  Seeing Dr [facility] but will not accept insurance as of Jan so will be seeing the [facility] Hearing: normal to conversational tones and whispered voice ADL's: independent Cognitive: normal linear thought process, memory and attention intact Home Safety: safe at home Height, Weight, BMI, Visual Acuity: see vitals, vision corrected to 20/20 w/ glasses Counseling: UTD on colonoscopy (2016), UTD on vaccines, due for [facility] Care team reviewed and updated Labs Ordered: See A&P Care Plan: See A&P    Review of Systems Patient reports no vision/ hearing changes, adenopathy,fever, weight change,  persistant/recurrent hoarseness , swallowing issues, chest pain, palpitations, edema, persistant/recurrent cough, hemoptysis, dyspnea (rest/exertional/paroxysmal nocturnal), gastrointestinal bleeding (melena, rectal bleeding), abdominal pain, significant heartburn, bowel changes, Gyn symptoms (abnormal  bleeding, pain),  syncope, focal weakness, memory loss, numbness & tingling, skin/hair/nail changes, abnormal bruising or bleeding, anxiety, or depression.   'my bladder is falling'    Objective:   Physical Exam General Appearance:    Alert, cooperative, no distress, appears stated age  Head:    Normocephalic, without obvious abnormality, atraumatic  Eyes:    PERRL, conjunctiva/corneas clear, EOM's intact, fundi    benign, both eyes  Ears:    Normal TM's and  external ear canals, both ears  Nose:   Nares normal, septum midline, mucosa normal, no drainage    or sinus tenderness  Throat:   Lips, mucosa, and tongue normal; teeth and gums normal  Neck:   Supple, symmetrical, trachea midline, no adenopathy;    Thyroid: no enlargement/tenderness/nodules  Back:     Symmetric, no curvature, ROM normal, no CVA tenderness  Lungs:     Clear to auscultation bilaterally, respirations unlabored  Chest Wall:    No tenderness or deformity   Heart:    Regular rate and rhythm, S1 and S2 normal, no murmur, rub   or gallop  Breast Exam:    Deferred to mammo  Abdomen:     Soft, non-tender, bowel sounds active all four quadrants,    no masses, no organomegaly  Genitalia:    Deferred  Rectal:    Extremities:   Extremities normal, atraumatic, no cyanosis or edema  Pulses:   2+ and symmetric all extremities  Skin:   Skin color, texture, turgor normal, no rashes or lesions  Lymph nodes:   Cervical, supraclavicular, and axillary nodes normal  Neurologic:   CNII-XII intact, normal strength, sensation and reflexes    throughout          Assessment & Plan:

## 2015-02-09 NOTE — Patient Instructions (Signed)
Follow up in 6 months to recheck BP and cholesterol We'll notify you of your lab results and make any changes if needed Try and work on healthy diet and regular exercise- you can do it! We'll call you with your Dermatology and Urology appts Please go downstairs and schedule your mammo and bone density You are up to date on colonoscopy and vaccines- yay! Call with any questions or concerns If you want to join [place] at the new [place] office, any scheduled appointments will automatically transfer and we will see you at [address] [place] [address] Tammy [place], [place] [address] (OPENING 03/10/15) Happy Holidays!!

## 2015-02-09 NOTE — Progress Notes (Signed)
Pre visit review using our clinic review tool, if applicable. No additional management support is needed unless otherwise documented below in the visit note. 

## 2015-02-10 ENCOUNTER — Encounter: Payer: Self-pay | Admitting: Family Medicine

## 2015-02-10 LAB — LIPID PANEL
Cholesterol: 263 mg/dL — ABNORMAL HIGH (ref 0–200)
HDL: 61 mg/dL (ref >39.00)
LDL CALC: 171 mg/dL — AB (ref 0–99)
NONHDL: 201.83
Total CHOL/HDL Ratio: 4
Triglycerides: 152 mg/dL — ABNORMAL HIGH (ref 0.0–149.0)
VLDL: 30.4 mg/dL (ref 0.0–40.0)

## 2015-02-10 LAB — HEPATIC FUNCTION PANEL
ALT: 19 U/L (ref 0–35)
AST: 21 U/L (ref 0–37)
Albumin: 4.6 g/dL (ref 3.5–5.2)
Alkaline Phosphatase: 90 U/L (ref 39–117)
Bilirubin, Direct: 0.1 mg/dL (ref 0.0–0.3)
TOTAL PROTEIN: 7.9 g/dL (ref 6.0–8.3)
Total Bilirubin: 0.3 mg/dL (ref 0.2–1.2)

## 2015-02-10 LAB — BASIC METABOLIC PANEL
BUN: 12 mg/dL (ref 6–23)
CO2: 31 meq/L (ref 19–32)
CREATININE: 1 mg/dL (ref 0.40–1.20)
Calcium: 10.8 mg/dL — ABNORMAL HIGH (ref 8.4–10.5)
Chloride: 101 mEq/L (ref 96–112)
GFR: 58.53 mL/min — AB (ref >60.00)
Glucose, Bld: 94 mg/dL (ref 70–99)
Potassium: 4.4 mEq/L (ref 3.5–5.1)
Sodium: 142 mEq/L (ref 135–145)

## 2015-02-10 LAB — CBC WITH DIFFERENTIAL/PLATELET
BASOS ABS: 0.1 10*3/uL (ref 0.0–0.1)
Basophils Relative: 0.8 % (ref 0.0–3.0)
EOS ABS: 0.2 10*3/uL (ref 0.0–0.7)
Eosinophils Relative: 2.1 % (ref 0.0–5.0)
HEMATOCRIT: 43.8 % (ref 36.0–46.0)
Hemoglobin: 14.1 g/dL (ref 12.0–15.0)
Lymphocytes Relative: 38 % (ref 12.0–46.0)
Lymphs Abs: 2.8 10*3/uL (ref 0.7–4.0)
MCHC: 32.2 g/dL (ref 30.0–36.0)
MCV: 88.3 fl (ref 78.0–100.0)
MONOS PCT: 4.9 % (ref 3.0–12.0)
Monocytes Absolute: 0.4 10*3/uL (ref 0.1–1.0)
Neutro Abs: 4 10*3/uL (ref 1.4–7.7)
Neutrophils Relative %: 54.2 % (ref 43.0–77.0)
Platelets: 345 10*3/uL (ref 150.0–400.0)
RBC: 4.97 Mil/uL (ref 3.87–5.11)
RDW: 13.9 % (ref 11.5–15.5)
WBC: 7.4 10*3/uL (ref 4.0–10.5)

## 2015-02-10 LAB — TSH: TSH: 0.79 u[IU]/mL (ref 0.35–4.50)

## 2015-02-10 NOTE — Assessment & Plan Note (Signed)
Chronic problem.  Adequate control.  Asymptomatic.  Check labs.  No anticipated med changes 

## 2015-02-10 NOTE — Assessment & Plan Note (Signed)
Chronic problem.  Pt is not wearing CPAP- 'i can't bear it'.  Pt reports she is asymptomatic.  Will follow.

## 2015-02-10 NOTE — Assessment & Plan Note (Signed)
Pt's PE unchanged from previous.  UTD on colonoscopy.  Due for mammo and DEXA- orders entered.  No need for paps.  Written screening schedule updated and given to pt.  Check labs.  Anticipatory guidance provided.

## 2015-02-10 NOTE — Assessment & Plan Note (Signed)
Chronic problem.  Pt has been resistant to medication in the past.  She now reports that depending on her cholesterol levels, she may be willing to start medication.  Will follow closely.

## 2015-02-10 NOTE — Assessment & Plan Note (Signed)
Chronic problem.  Following w/ psych.  Pt reports sxs are well controlled.  Will follow along and assist as able.

## 2015-02-10 NOTE — Assessment & Plan Note (Signed)
Ongoing issue for pt.  Refer back to urology for complete evaluation and tx.

## 2015-02-11 ENCOUNTER — Other Ambulatory Visit: Payer: Self-pay | Admitting: General Practice

## 2015-02-11 DIAGNOSIS — E785 Hyperlipidemia, unspecified: Secondary | ICD-10-CM

## 2015-02-11 MED ORDER — ROSUVASTATIN CALCIUM 10 MG PO TABS: 10.0000 mg | ORAL_TABLET | Freq: Every day | ORAL | Status: DC

## 2015-02-13 ENCOUNTER — Telehealth: Payer: Self-pay | Admitting: Family Medicine

## 2015-02-13 NOTE — Telephone Encounter (Signed)
Caller name: [place]   Relationship to patient: Self   Can be reached: [phone]  Reason for call: pt called in to request her lab results.

## 2015-02-13 NOTE — Telephone Encounter (Signed)
Pt informed of results.

## 2015-02-17 ENCOUNTER — Other Ambulatory Visit (HOSPITAL_BASED_OUTPATIENT_CLINIC_OR_DEPARTMENT_OTHER): Payer: Self-pay

## 2015-02-17 ENCOUNTER — Ambulatory Visit (HOSPITAL_BASED_OUTPATIENT_CLINIC_OR_DEPARTMENT_OTHER): Payer: Self-pay

## 2015-02-26 ENCOUNTER — Encounter: Payer: Self-pay | Admitting: Family Medicine

## 2015-02-27 ENCOUNTER — Other Ambulatory Visit (HOSPITAL_BASED_OUTPATIENT_CLINIC_OR_DEPARTMENT_OTHER): Payer: Self-pay

## 2015-02-27 ENCOUNTER — Ambulatory Visit (HOSPITAL_BASED_OUTPATIENT_CLINIC_OR_DEPARTMENT_OTHER): Payer: Self-pay

## 2015-04-06 DIAGNOSIS — R3915 Urgency of urination: Secondary | ICD-10-CM | POA: Diagnosis not present

## 2015-04-06 DIAGNOSIS — N8111 Cystocele, midline: Secondary | ICD-10-CM | POA: Diagnosis not present

## 2015-04-06 DIAGNOSIS — Z0000 Encounter for general adult medical examination without abnormal findings: Secondary | ICD-10-CM | POA: Diagnosis not present

## 2015-04-06 DIAGNOSIS — N816 Rectocele: Secondary | ICD-10-CM | POA: Diagnosis not present

## 2015-04-13 ENCOUNTER — Other Ambulatory Visit: Payer: Self-pay | Admitting: Family Medicine

## 2015-04-13 NOTE — Telephone Encounter (Signed)
Medication filled to pharmacy as requested.   

## 2015-04-20 DIAGNOSIS — S93401A Sprain of unspecified ligament of right ankle, initial encounter: Secondary | ICD-10-CM | POA: Diagnosis not present

## 2015-07-16 ENCOUNTER — Other Ambulatory Visit: Payer: Self-pay | Admitting: Medical

## 2015-08-07 DIAGNOSIS — M25579 Pain in unspecified ankle and joints of unspecified foot: Secondary | ICD-10-CM | POA: Insufficient documentation

## 2015-08-07 DIAGNOSIS — M25571 Pain in right ankle and joints of right foot: Secondary | ICD-10-CM | POA: Diagnosis not present

## 2015-08-07 DIAGNOSIS — M19071 Primary osteoarthritis, right ankle and foot: Secondary | ICD-10-CM | POA: Diagnosis not present

## 2015-08-13 DIAGNOSIS — G8929 Other chronic pain: Secondary | ICD-10-CM | POA: Diagnosis not present

## 2015-08-13 DIAGNOSIS — M25512 Pain in left shoulder: Secondary | ICD-10-CM | POA: Diagnosis not present

## 2015-08-18 ENCOUNTER — Ambulatory Visit: Payer: Medicare Other | Attending: Orthopedic Surgery | Admitting: Physical Therapy

## 2015-08-18 ENCOUNTER — Encounter: Payer: Self-pay | Admitting: Physical Therapy

## 2015-08-18 DIAGNOSIS — R262 Difficulty in walking, not elsewhere classified: Secondary | ICD-10-CM | POA: Insufficient documentation

## 2015-08-18 DIAGNOSIS — M25572 Pain in left ankle and joints of left foot: Secondary | ICD-10-CM | POA: Diagnosis not present
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
## 2015-09-04 DIAGNOSIS — C44612 Basal cell carcinoma of skin of right upper limb, including shoulder: Secondary | ICD-10-CM | POA: Diagnosis not present

## 2015-09-04 DIAGNOSIS — L820 Inflamed seborrheic keratosis: Secondary | ICD-10-CM | POA: Diagnosis not present

## 2015-09-23 ENCOUNTER — Ambulatory Visit (INDEPENDENT_AMBULATORY_CARE_PROVIDER_SITE_OTHER)
Admission: RE | Admit: 2015-09-23 | Discharge: 2015-09-23 | Disposition: A | Discharge status: Home / Self Care | Payer: Medicare Other | Source: Ambulatory Visit | Attending: Internal Medicine | Admitting: Internal Medicine

## 2015-09-23 ENCOUNTER — Encounter: Payer: Self-pay | Admitting: Internal Medicine

## 2015-09-23 ENCOUNTER — Ambulatory Visit (INDEPENDENT_AMBULATORY_CARE_PROVIDER_SITE_OTHER): Payer: Medicare Other | Admitting: Internal Medicine

## 2015-09-23 VITALS — BP 164/76 | HR 71 | Temp 98.9°F | Resp 16 | Wt 161.0 lb

## 2015-09-23 DIAGNOSIS — C4490 Unspecified malignant neoplasm of skin, unspecified: Secondary | ICD-10-CM

## 2015-09-23 DIAGNOSIS — R05 Cough: Secondary | ICD-10-CM | POA: Diagnosis not present

## 2015-09-23 DIAGNOSIS — R413 Other amnesia: Secondary | ICD-10-CM

## 2015-09-23 DIAGNOSIS — I10 Essential (primary) hypertension: Secondary | ICD-10-CM | POA: Diagnosis not present

## 2015-09-23 DIAGNOSIS — R0600 Dyspnea, unspecified: Secondary | ICD-10-CM

## 2015-09-23 DIAGNOSIS — M25571 Pain in right ankle and joints of right foot: Secondary | ICD-10-CM

## 2015-09-23 DIAGNOSIS — E785 Hyperlipidemia, unspecified: Secondary | ICD-10-CM

## 2015-09-23 DIAGNOSIS — F3160 Bipolar disorder, current episode mixed, unspecified: Secondary | ICD-10-CM | POA: Diagnosis not present

## 2015-09-23 MED ORDER — RANITIDINE HCL 150 MG PO TABS: 150.0000 mg | ORAL_TABLET | Freq: Two times a day (BID) | ORAL | Status: DC

## 2015-09-23 NOTE — Progress Notes (Signed)
Pre visit review using our clinic review tool, if applicable. No additional management support is needed unless otherwise documented below in the visit note. 

## 2015-09-23 NOTE — Assessment & Plan Note (Signed)
BP elevated here today, but typically well controlled Continue current medications Monitor BP at home/pharmacy

## 2015-09-23 NOTE — Assessment & Plan Note (Signed)
Following with psych Well controlled Taking lamictal, gabapentin, seroquel and wellbutrin Management per psych

## 2015-09-23 NOTE — Assessment & Plan Note (Signed)
Associated with wheeze and cough ? Related to allergies, gerd or other Check cxr, echo Fear of needles so we will hold off on blood work Take allergy medication daily and start zantac 150 mg BID for at least one month Follow up if no improvement

## 2015-09-23 NOTE — Patient Instructions (Addendum)
Monitor your blood pressure at home.  Goal is less than 150/90.  Test(s) ordered today. Your results will be released to [place] (or called to you) after review, usually within 72hours after test completion. If any changes need to be made, you will be notified at that same time.  All other Health Maintenance issues reviewed.   All recommended immunizations and age-appropriate screenings are up-to-date or discussed.  No immunizations administered today.   Medications reviewed and updated.  Changes include zantac 150 mg twice daily.  Start zyrtec D daily.  Take both for at least one month.  Continue the flonase.    Your prescription(s) have been submitted to your pharmacy. Please take as directed and contact our office if you believe you are having problem(s) with the medication(s).  An Echo has been ordered.

## 2015-09-23 NOTE — Assessment & Plan Note (Signed)
Will follow up with derm

## 2015-09-23 NOTE — Assessment & Plan Note (Addendum)
Does not want to take medication -  Was prescribed crestor and did not take it Work on lifestyle only for now

## 2015-09-23 NOTE — Assessment & Plan Note (Signed)
Related to injury a few months ago  - sprained ankle and then kicked soccer ball that resulted in contusion on medial malleolus Has seen ortho Still with symptoms No further treatment needed - will take time to improve, but may have some chronic pain F/u with ortho as needed

## 2015-09-23 NOTE — Assessment & Plan Note (Signed)
She thinks she has early dementia - has had some difficulty processing numbers, had to retire early Declined further evaluation at this time - will let me know if she changes her mind
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
## 2015-09-24 ENCOUNTER — Telehealth: Payer: Self-pay | Admitting: Internal Medicine

## 2015-09-24 DIAGNOSIS — R0600 Dyspnea, unspecified: Secondary | ICD-10-CM

## 2015-09-24 DIAGNOSIS — J189 Pneumonia, unspecified organism: Secondary | ICD-10-CM

## 2015-09-24 MED ORDER — DOXYCYCLINE HYCLATE 100 MG PO TABS: 100.0000 mg | ORAL_TABLET | Freq: Two times a day (BID) | ORAL | Status: DC

## 2015-09-24 NOTE — Telephone Encounter (Signed)
Call her with her chest xray results:  There is some hyperinflation possibly a small area of pneumonia - I would like to give her an antibiotic and have her get a repeat chest xray in 4 weeks to recheck this.  I have sent an antibiotic to her pharmacy.  She should return for a chest xray in 4 weeks (ordered)

## 2015-09-25 NOTE — Telephone Encounter (Signed)
Spoke with pt to inform.  

## 2015-10-01 DIAGNOSIS — S40861A Insect bite (nonvenomous) of right upper arm, initial encounter: Secondary | ICD-10-CM | POA: Diagnosis not present

## 2015-10-01 DIAGNOSIS — L820 Inflamed seborrheic keratosis: Secondary | ICD-10-CM | POA: Diagnosis not present

## 2015-10-01 DIAGNOSIS — C44612 Basal cell carcinoma of skin of right upper limb, including shoulder: Secondary | ICD-10-CM | POA: Diagnosis not present

## 2015-10-11 ENCOUNTER — Other Ambulatory Visit: Payer: Self-pay | Admitting: Family Medicine

## 2015-10-13 ENCOUNTER — Ambulatory Visit (INDEPENDENT_AMBULATORY_CARE_PROVIDER_SITE_OTHER)
Admission: RE | Admit: 2015-10-13 | Discharge: 2015-10-13 | Disposition: A | Discharge status: Home / Self Care | Payer: Medicare Other | Source: Ambulatory Visit | Attending: Internal Medicine | Admitting: Internal Medicine

## 2015-10-13 DIAGNOSIS — J189 Pneumonia, unspecified organism: Secondary | ICD-10-CM

## 2015-10-13 DIAGNOSIS — R0600 Dyspnea, unspecified: Secondary | ICD-10-CM | POA: Diagnosis not present

## 2015-10-16 ENCOUNTER — Other Ambulatory Visit: Payer: Self-pay

## 2015-10-16 ENCOUNTER — Ambulatory Visit (HOSPITAL_COMMUNITY): Payer: Medicare Other | Attending: Cardiology

## 2015-10-16 DIAGNOSIS — Z87891 Personal history of nicotine dependence: Secondary | ICD-10-CM | POA: Diagnosis not present

## 2015-10-16 DIAGNOSIS — R0600 Dyspnea, unspecified: Secondary | ICD-10-CM | POA: Diagnosis not present

## 2015-10-16 DIAGNOSIS — I119 Hypertensive heart disease without heart failure: Secondary | ICD-10-CM | POA: Diagnosis not present

## 2015-10-16 DIAGNOSIS — E785 Hyperlipidemia, unspecified: Secondary | ICD-10-CM | POA: Diagnosis not present

## 2015-10-16 DIAGNOSIS — G4733 Obstructive sleep apnea (adult) (pediatric): Secondary | ICD-10-CM | POA: Diagnosis not present

## 2015-10-18 ENCOUNTER — Encounter: Payer: Self-pay | Admitting: Internal Medicine

## 2015-10-18 DIAGNOSIS — I5189 Other ill-defined heart diseases: Secondary | ICD-10-CM | POA: Insufficient documentation

## 2015-11-19 ENCOUNTER — Telehealth: Payer: Self-pay | Admitting: Family Medicine

## 2015-11-19 MED ORDER — FLUTICASONE PROPIONATE 50 MCG/ACT NA SUSP: NASAL | 8 refills | Status: DC

## 2015-11-19 NOTE — Telephone Encounter (Signed)
Message routed to patient's PCP (Dr. Quay Burow)  for approval/denial.

## 2015-11-19 NOTE — Telephone Encounter (Signed)
Pt asking if KT would call in a refill on flonase, [facility] [address]

## 2015-11-19 NOTE — Telephone Encounter (Signed)
sent 

## 2015-11-27 ENCOUNTER — Emergency Department (HOSPITAL_COMMUNITY): Payer: Medicare Other

## 2015-11-27 ENCOUNTER — Encounter (HOSPITAL_COMMUNITY): Payer: Self-pay | Admitting: Emergency Medicine

## 2015-11-27 ENCOUNTER — Emergency Department (HOSPITAL_COMMUNITY)
Admission: EM | Admit: 2015-11-27 | Discharge: 2015-11-27 | Disposition: A | Discharge status: Home / Self Care | Payer: Medicare Other | Attending: Emergency Medicine | Admitting: Emergency Medicine

## 2015-11-27 DIAGNOSIS — Z87891 Personal history of nicotine dependence: Secondary | ICD-10-CM | POA: Insufficient documentation

## 2015-11-27 DIAGNOSIS — R519 Headache, unspecified: Secondary | ICD-10-CM

## 2015-11-27 DIAGNOSIS — Z79899 Other long term (current) drug therapy: Secondary | ICD-10-CM | POA: Insufficient documentation

## 2015-11-27 DIAGNOSIS — I10 Essential (primary) hypertension: Secondary | ICD-10-CM | POA: Insufficient documentation

## 2015-11-27 DIAGNOSIS — R51 Headache: Secondary | ICD-10-CM | POA: Diagnosis not present

## 2015-11-27 DIAGNOSIS — R112 Nausea with vomiting, unspecified: Secondary | ICD-10-CM

## 2015-11-27 DIAGNOSIS — G4489 Other headache syndrome: Secondary | ICD-10-CM | POA: Diagnosis not present

## 2015-11-27 LAB — TROPONIN I: Troponin I: <0.03 ng/mL (ref <0.03)

## 2015-11-27 LAB — COMPREHENSIVE METABOLIC PANEL
ALK PHOS: 85 U/L (ref 38–126)
ALT: 20 U/L (ref 14–54)
AST: 30 U/L (ref 15–41)
Albumin: 4.4 g/dL (ref 3.5–5.0)
Anion gap: 12 (ref 5–15)
BUN: 10 mg/dL (ref 6–20)
CALCIUM: 9.9 mg/dL (ref 8.9–10.3)
CO2: 24 mmol/L (ref 22–32)
CREATININE: 0.87 mg/dL (ref 0.44–1.00)
Chloride: 99 mmol/L — ABNORMAL LOW (ref 101–111)
GFR calc non Af Amer: >60 mL/min (ref >60)
Glucose, Bld: 142 mg/dL — ABNORMAL HIGH (ref 65–99)
Potassium: 3.7 mmol/L (ref 3.5–5.1)
SODIUM: 135 mmol/L (ref 135–145)
Total Bilirubin: 0.9 mg/dL (ref 0.3–1.2)
Total Protein: 7.8 g/dL (ref 6.5–8.1)

## 2015-11-27 LAB — CBC WITH DIFFERENTIAL/PLATELET
BASOS PCT: 0 %
Basophils Absolute: 0 10*3/uL (ref 0.0–0.1)
Eosinophils Absolute: 0 10*3/uL (ref 0.0–0.7)
Eosinophils Relative: 0 %
HEMATOCRIT: 38.8 % (ref 36.0–46.0)
HEMOGLOBIN: 13.2 g/dL (ref 12.0–15.0)
LYMPHS ABS: 1.2 10*3/uL (ref 0.7–4.0)
LYMPHS PCT: 11 %
MCH: 29.1 pg (ref 26.0–34.0)
MCHC: 34 g/dL (ref 30.0–36.0)
MCV: 85.7 fL (ref 78.0–100.0)
Monocytes Absolute: 0.4 10*3/uL (ref 0.1–1.0)
Monocytes Relative: 3 %
NEUTROS ABS: 9.5 10*3/uL — AB (ref 1.7–7.7)
NEUTROS PCT: 86 %
Platelets: 279 10*3/uL (ref 150–400)
RBC: 4.53 MIL/uL (ref 3.87–5.11)
RDW: 13.1 % (ref 11.5–15.5)
WBC: 11.1 10*3/uL — ABNORMAL HIGH (ref 4.0–10.5)

## 2015-11-27 LAB — LIPASE, BLOOD: LIPASE: 25 U/L (ref 11–51)

## 2015-11-27 LAB — CBG MONITORING, ED: Glucose-Capillary: 116 mg/dL — ABNORMAL HIGH (ref 65–99)

## 2015-11-27 MED ORDER — SODIUM CHLORIDE 0.9 % IV BOLUS (SEPSIS)
1000.0000 mL | Freq: Once | INTRAVENOUS | Status: AC
  Administered 2015-11-27: 1000 mL via INTRAVENOUS

## 2015-11-27 MED ORDER — CLINDAMYCIN HCL 300 MG PO CAPS: 300.0000 mg | ORAL_CAPSULE | Freq: Four times a day (QID) | ORAL | 0 refills | Status: DC

## 2015-11-27 MED ORDER — ONDANSETRON HCL 4 MG/2ML IJ SOLN
4.0000 mg | Freq: Once | INTRAMUSCULAR | Status: AC
  Administered 2015-11-27: 4 mg via INTRAVENOUS
  Filled 2015-11-27: qty 2

## 2015-11-27 MED ORDER — PROMETHAZINE HCL 25 MG RE SUPP: 25.0000 mg | Freq: Four times a day (QID) | RECTAL | 0 refills | Status: DC | PRN

## 2015-11-27 MED ORDER — DIPHENHYDRAMINE HCL 50 MG/ML IJ SOLN
25.0000 mg | Freq: Once | INTRAMUSCULAR | Status: AC
  Administered 2015-11-27: 25 mg via INTRAVENOUS
  Filled 2015-11-27: qty 1

## 2015-11-27 MED ORDER — ACETAMINOPHEN 325 MG PO TABS
650.0000 mg | ORAL_TABLET | Freq: Once | ORAL | Status: DC
  Filled 2015-11-27: qty 2

## 2015-11-27 MED ORDER — KETOROLAC TROMETHAMINE 30 MG/ML IJ SOLN
15.0000 mg | Freq: Once | INTRAMUSCULAR | Status: AC
  Administered 2015-11-27: 15 mg via INTRAVENOUS
  Filled 2015-11-27: qty 1

## 2015-11-27 MED ORDER — ONDANSETRON 4 MG PO TBDP: 4.0000 mg | ORAL_TABLET | Freq: Three times a day (TID) | ORAL | 0 refills | Status: DC | PRN

## 2015-11-27 MED ORDER — METOCLOPRAMIDE HCL 5 MG/ML IJ SOLN
5.0000 mg | Freq: Once | INTRAMUSCULAR | Status: AC
  Administered 2015-11-27: 5 mg via INTRAVENOUS
  Filled 2015-11-27: qty 2

## 2015-11-27 NOTE — ED Triage Notes (Signed)
Patient here from home with complaints of nausea/vomting after wisdom tooth removal. Headache 7/10.

## 2015-11-27 NOTE — ED Notes (Signed)
Patient eating ice when discharged and hen told writer that she felt nauseated. Explained that she needed to not eat the ice if she felt nauseated. Again went over the prescriptions with the patient while she was in the lobby waiting for her ride.
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
## 2015-11-27 NOTE — ED Notes (Signed)
Patient transported to CT 

## 2015-11-27 NOTE — ED Notes (Signed)
Bed: [facility] Expected date:  Expected time:  Means of arrival:  Comments: N, V after wisdom tooth procedure

## 2015-12-17 ENCOUNTER — Telehealth: Payer: Self-pay | Admitting: Internal Medicine

## 2015-12-17 DIAGNOSIS — G4733 Obstructive sleep apnea (adult) (pediatric): Secondary | ICD-10-CM

## 2015-12-17 NOTE — Telephone Encounter (Signed)
Are you okay with doing this? Its usually pulmonary that does cpap.

## 2015-12-17 NOTE — Telephone Encounter (Signed)
She really needs to see pulmonary - it is not just for the prescription but to monitor therapy.  I can refer if she wishes

## 2015-12-17 NOTE — Telephone Encounter (Signed)
Patient called to advise that her CPAP machine is in need of repairs. [facility] needs a new RX for a CPAP in order to do any repairs/ replacements. Please issue a new script to [facility]   She was unable to provide a fax at this point.

## 2015-12-18 NOTE — Telephone Encounter (Signed)
Spoke with pt, Please enter referral.

## 2015-12-18 NOTE — Telephone Encounter (Signed)
LVM for pt to call back and discuss.  

## 2015-12-18 NOTE — Telephone Encounter (Signed)
Referral ordered

## 2016-01-19 ENCOUNTER — Other Ambulatory Visit: Payer: Self-pay | Admitting: Emergency Medicine

## 2016-01-19 MED ORDER — HYDROCHLOROTHIAZIDE 12.5 MG PO CAPS: 12.5000 mg | ORAL_CAPSULE | Freq: Every morning | ORAL | 1 refills | Status: DC

## 2016-01-20 ENCOUNTER — Telehealth: Payer: Self-pay | Admitting: *Deleted

## 2016-01-20 NOTE — Telephone Encounter (Signed)
Left msg on triage stating wanting to make sure her pharmacy has been changed to [facility]. Updated pharmacy...Johny Chess

## 2016-05-10 ENCOUNTER — Other Ambulatory Visit: Payer: Self-pay | Admitting: Internal Medicine

## 2016-07-13 ENCOUNTER — Other Ambulatory Visit: Payer: Self-pay | Admitting: Internal Medicine

## 2016-07-15 ENCOUNTER — Telehealth: Payer: Self-pay | Admitting: Internal Medicine

## 2016-07-15 NOTE — Telephone Encounter (Signed)
Called patient to schedule awv. Lvm for patient to call office to schedule appt.  °

## 2016-08-18 DIAGNOSIS — Z85828 Personal history of other malignant neoplasm of skin: Secondary | ICD-10-CM | POA: Diagnosis not present

## 2016-08-18 DIAGNOSIS — L820 Inflamed seborrheic keratosis: Secondary | ICD-10-CM | POA: Diagnosis not present

## 2016-08-18 DIAGNOSIS — L720 Epidermal cyst: Secondary | ICD-10-CM | POA: Diagnosis not present

## 2016-10-03 ENCOUNTER — Telehealth: Payer: Self-pay | Admitting: Internal Medicine

## 2016-10-03 NOTE — Telephone Encounter (Signed)
LVM informing pt she would need to contact her insurance company.

## 2016-10-03 NOTE — Telephone Encounter (Signed)
Pt called asking how much her second shot of her Shingrix vaccine would be if she got it here, she said  [facility] said it is not covered, I do not see the Rx for this in her med list  Please call back

## 2016-10-08 ENCOUNTER — Telehealth: Payer: Self-pay | Admitting: Internal Medicine

## 2016-10-12 NOTE — Telephone Encounter (Signed)
Pt called stating that she was not able to come to her appointment that was previously scheduled on Friday. The next available opening is on August 23rd. She said that she has 9 pills left. Can a small supply be sent in for her to get her through?

## 2016-10-13 MED ORDER — HYDROCHLOROTHIAZIDE 12.5 MG PO CAPS: 12.5000 mg | ORAL_CAPSULE | Freq: Every morning | ORAL | 0 refills | Status: DC

## 2016-10-13 NOTE — Telephone Encounter (Signed)
Notified pt we can only send a 7 day supply until appt 10/27/16. Sent electronically...Tammy Stevens

## 2016-10-13 NOTE — Addendum Note (Signed)
Addended by: Earnstine Regal on: 10/13/2016 10:26 AM   Modules accepted: Orders

## 2016-10-14 ENCOUNTER — Ambulatory Visit: Payer: Self-pay | Admitting: Internal Medicine

## 2016-10-27 ENCOUNTER — Encounter: Payer: Self-pay | Admitting: Internal Medicine

## 2016-10-27 ENCOUNTER — Other Ambulatory Visit: Payer: Self-pay | Admitting: Internal Medicine

## 2016-10-27 ENCOUNTER — Ambulatory Visit (INDEPENDENT_AMBULATORY_CARE_PROVIDER_SITE_OTHER): Payer: Medicare HMO | Admitting: Internal Medicine

## 2016-10-27 VITALS — BP 154/86 | HR 74 | Temp 99.2°F | Resp 16 | Ht 66.0 in | Wt 157.0 lb

## 2016-10-27 DIAGNOSIS — R739 Hyperglycemia, unspecified: Secondary | ICD-10-CM | POA: Insufficient documentation

## 2016-10-27 DIAGNOSIS — R51 Headache: Secondary | ICD-10-CM | POA: Diagnosis not present

## 2016-10-27 DIAGNOSIS — I10 Essential (primary) hypertension: Secondary | ICD-10-CM

## 2016-10-27 DIAGNOSIS — E7800 Pure hypercholesterolemia, unspecified: Secondary | ICD-10-CM | POA: Diagnosis not present

## 2016-10-27 DIAGNOSIS — R519 Headache, unspecified: Secondary | ICD-10-CM | POA: Insufficient documentation

## 2016-10-27 MED ORDER — METHYLPREDNISOLONE ACETATE 80 MG/ML IJ SUSP
80.0000 mg | Freq: Once | INTRAMUSCULAR | Status: AC
  Administered 2016-10-27: 80 mg via INTRAMUSCULAR

## 2016-10-27 NOTE — Assessment & Plan Note (Signed)
Check a1c Low sugar / carb diet Stressed regular exercise   

## 2016-10-27 NOTE — Assessment & Plan Note (Addendum)
Elevated here today and has been elevated when she checks it for a while Possibly related to her smoking - she does want to quit and wants to try to quit before starting another medication Low sodium diet Encouraged regular exercise Monitor BP closely - goal < 140/90 - return if elevated so meds can be adjusted cmp

## 2016-10-27 NOTE — Patient Instructions (Addendum)
You received a steroid injection here today for your headache..   Try taking Xyzal at bedtime for your allergies and using the nasacort nasal spray.      Test(s) ordered today. Your results will be released to [facility] (or called to you) after review, usually within 72hours after test completion. If any changes need to be made, you will be notified at that same time.    Medications reviewed and updated.  No changes recommended at this time.   Please followup in 6 months for a physical
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
## 2016-10-27 NOTE — Assessment & Plan Note (Signed)
>   24 hrs, intractable, not responding to her usual medications Pain is severe Depo-medrol 80 mg IM x 1 Ok to take otc medications if needed

## 2016-10-27 NOTE — Assessment & Plan Note (Signed)
Check lipid panel  Does not want to take medication Regular exercise and healthy diet encouraged

## 2016-10-28 ENCOUNTER — Other Ambulatory Visit: Payer: Self-pay | Admitting: Internal Medicine

## 2016-11-03 ENCOUNTER — Telehealth: Payer: Self-pay | Admitting: Internal Medicine

## 2016-11-03 MED ORDER — TRIAMCINOLONE ACETONIDE 55 MCG/ACT NA AERO: 2.0000 | INHALATION_SPRAY | Freq: Every day | NASAL | 12 refills | Status: DC

## 2016-11-03 NOTE — Telephone Encounter (Signed)
Called pt no answer LMOM [facility].../lmb

## 2016-11-03 NOTE — Telephone Encounter (Signed)
sent 

## 2016-11-03 NOTE — Telephone Encounter (Signed)
Patient has been using the samples of the nasacort nasal spray since last Thursday 8/23. She does believe it is working well. She would like a RX called in for this. Patient aware Dr.Burns is out of office.   [id number] [address], [place] [phone] (Phone) [phone] (Fax)

## 2016-12-22 DIAGNOSIS — B078 Other viral warts: Secondary | ICD-10-CM | POA: Diagnosis not present

## 2016-12-22 DIAGNOSIS — L281 Prurigo nodularis: Secondary | ICD-10-CM | POA: Diagnosis not present

## 2016-12-22 DIAGNOSIS — L821 Other seborrheic keratosis: Secondary | ICD-10-CM | POA: Diagnosis not present

## 2016-12-22 DIAGNOSIS — D485 Neoplasm of uncertain behavior of skin: Secondary | ICD-10-CM | POA: Diagnosis not present

## 2016-12-22 DIAGNOSIS — C44529 Squamous cell carcinoma of skin of other part of trunk: Secondary | ICD-10-CM | POA: Diagnosis not present

## 2016-12-22 DIAGNOSIS — Z85828 Personal history of other malignant neoplasm of skin: Secondary | ICD-10-CM | POA: Diagnosis not present

## 2016-12-28 DIAGNOSIS — F3170 Bipolar disorder, currently in remission, most recent episode unspecified: Secondary | ICD-10-CM | POA: Diagnosis not present
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
## 2017-01-17 ENCOUNTER — Encounter: Payer: Self-pay | Admitting: Internal Medicine

## 2017-02-12 IMAGING — DX DG CHEST 2V
2 series · 2 of 2 positions shown · non-contrast
Comparison: 09/23/2015

CLINICAL DATA: Follow-up pneumonia

EXAM:
CHEST  2 VIEW

[chest pa]
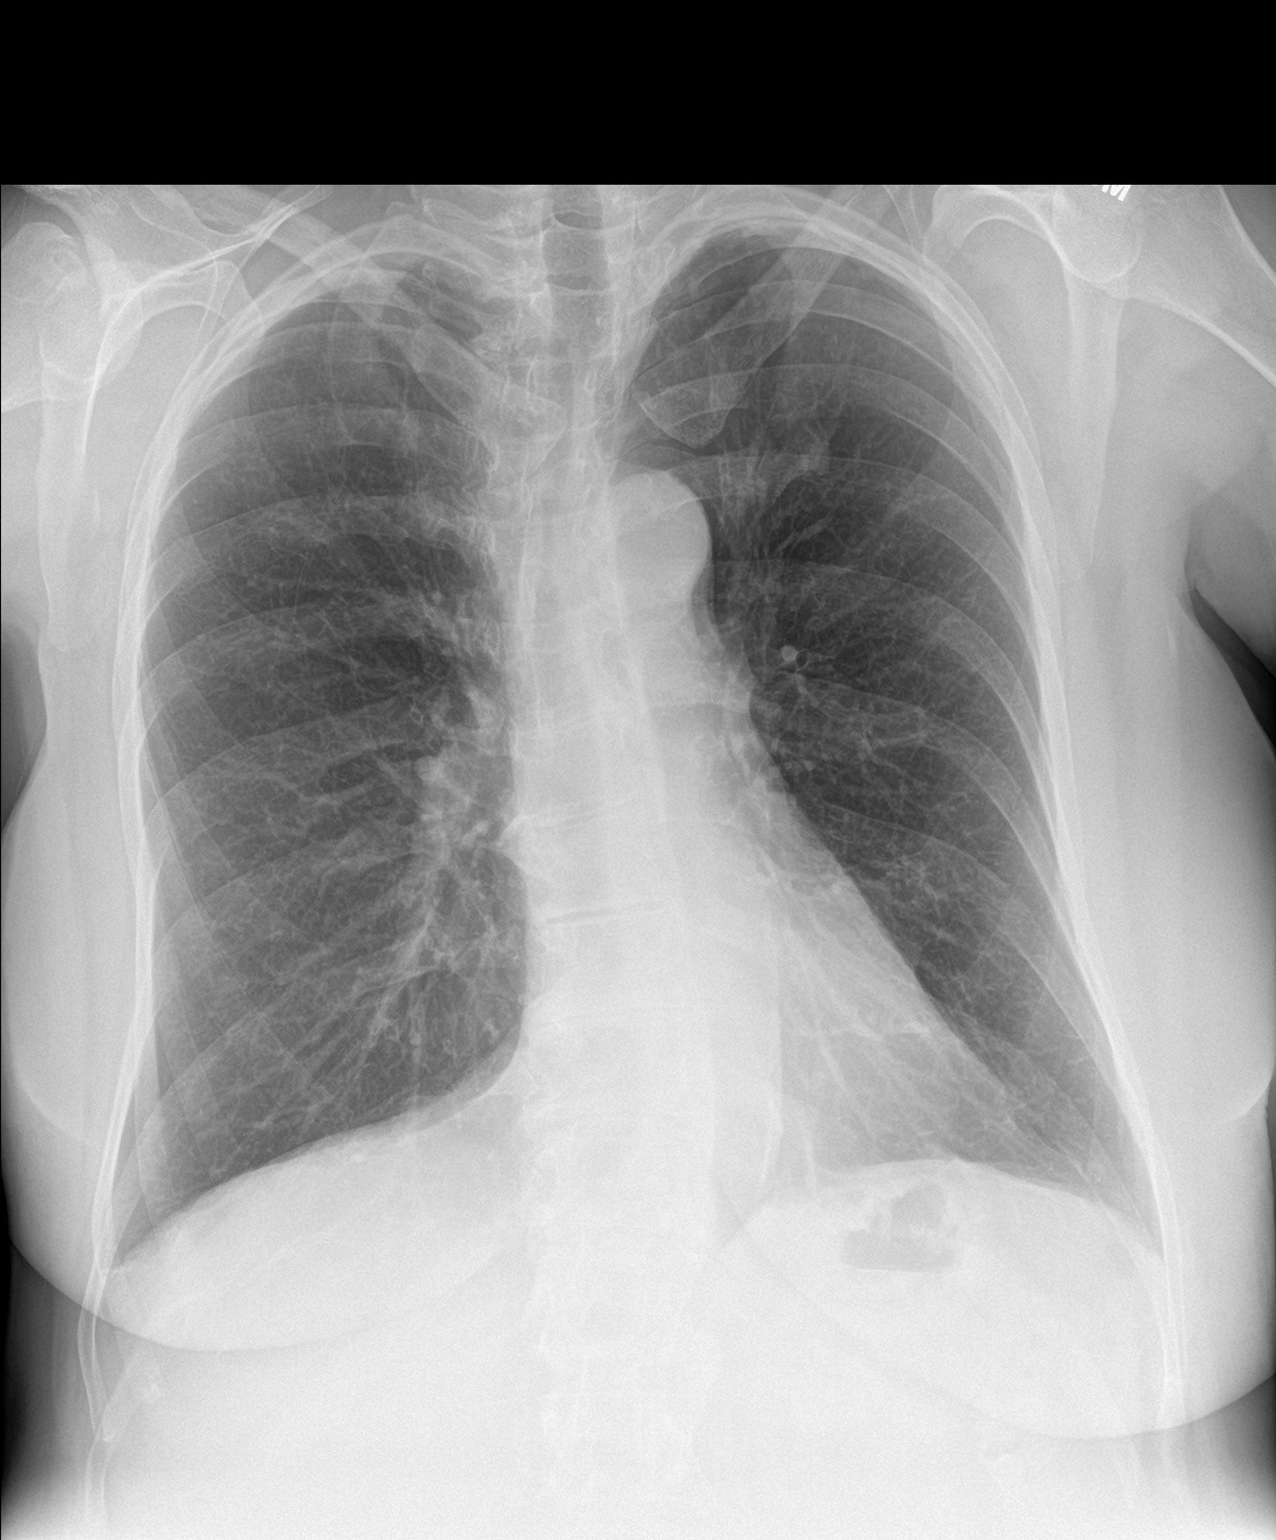

[chest lat]
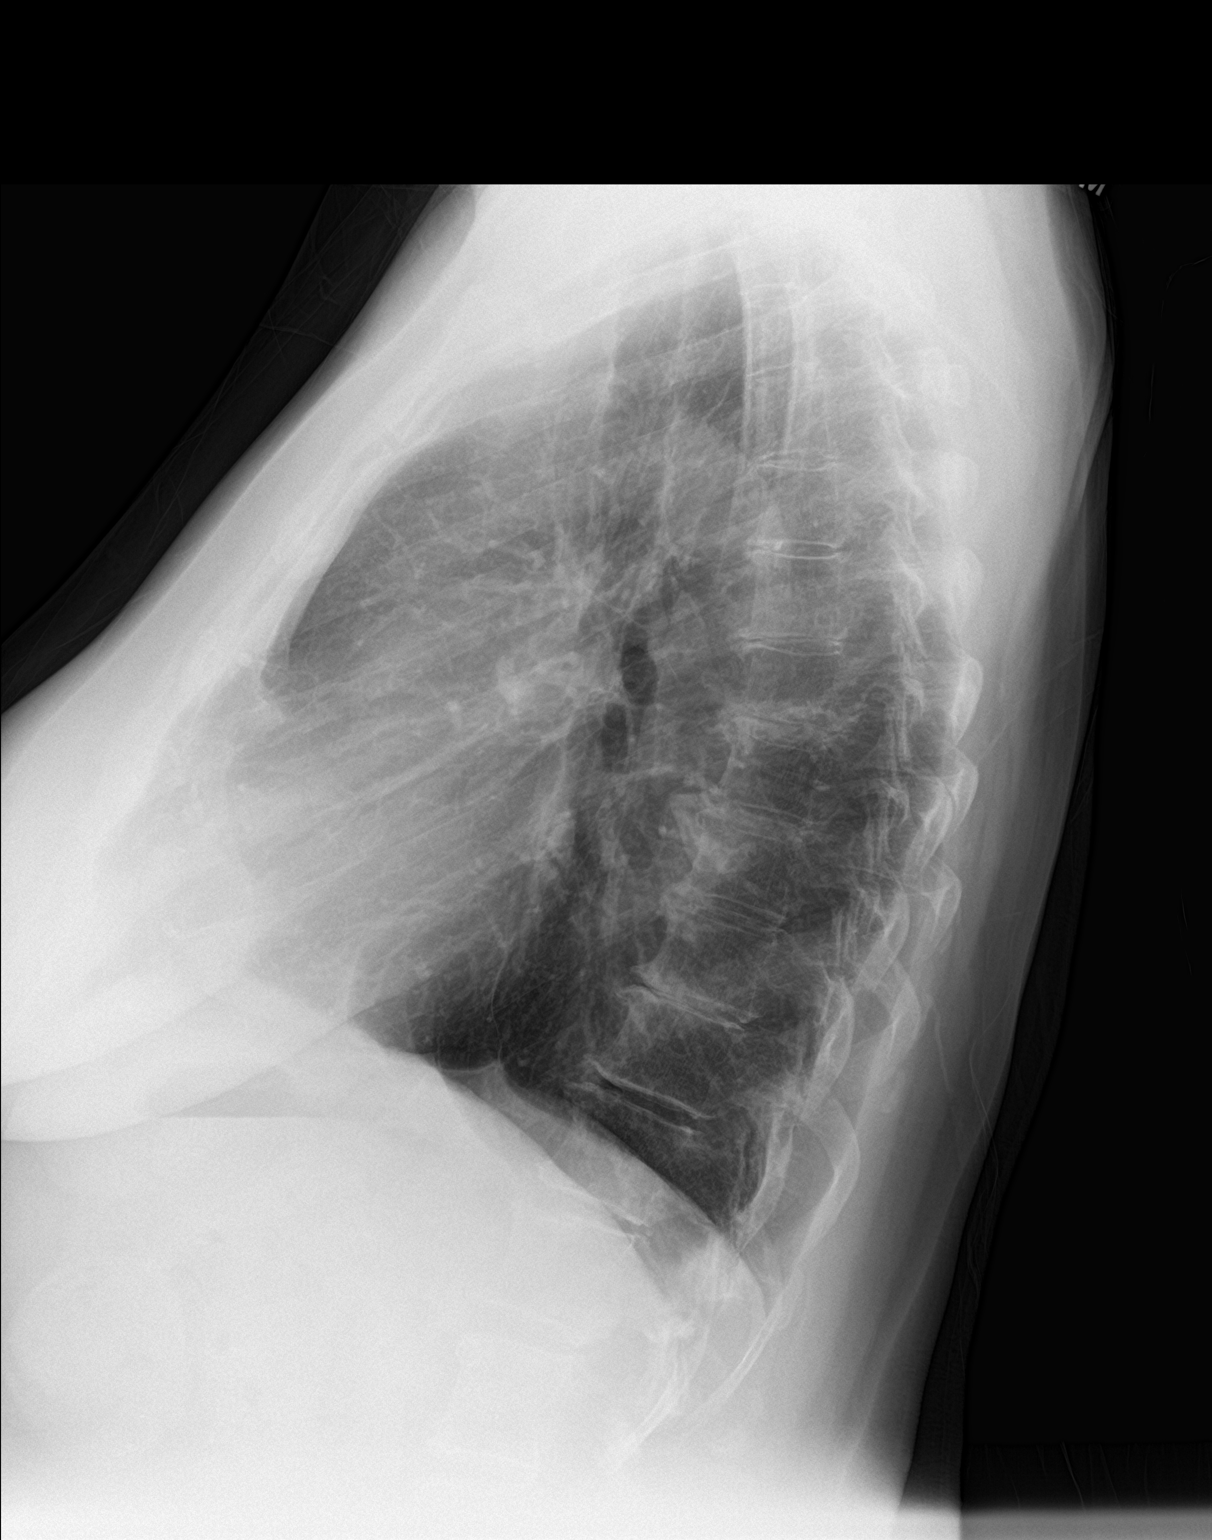

[2 of 2 positions shown; findings below may reference images not displayed]

FINDINGS: Cardiac shadow is within normal limits. The lungs are clear
bilaterally. Mild hyperinflation is noted. Mild degenerative changes
of the thoracic spine are noted. No other focal abnormality is seen.
IMPRESSION: No acute abnormality noted.

## 2017-02-23 ENCOUNTER — Other Ambulatory Visit: Payer: Self-pay | Admitting: Emergency Medicine

## 2017-02-23 MED ORDER — RANITIDINE HCL 150 MG PO TABS: 150.0000 mg | ORAL_TABLET | Freq: Two times a day (BID) | ORAL | 1 refills | Status: DC

## 2017-03-17 ENCOUNTER — Telehealth: Payer: Self-pay | Admitting: Internal Medicine

## 2017-03-17 NOTE — Telephone Encounter (Signed)
Copied from [place] [id number]. Topic: General - Other >> Mar 17, 2017  3:00 PM Vernona Rieger wrote: Patient states she received a bill in the mail from [facility] & it needs to be approved by Dr. Quay Burow that she approves her going to therapy. [phone]) Call back is [phone]

## 2017-03-29 DIAGNOSIS — C44529 Squamous cell carcinoma of skin of other part of trunk: Secondary | ICD-10-CM | POA: Diagnosis not present

## 2017-03-29 DIAGNOSIS — L570 Actinic keratosis: Secondary | ICD-10-CM | POA: Diagnosis not present

## 2017-03-29 DIAGNOSIS — Z85828 Personal history of other malignant neoplasm of skin: Secondary | ICD-10-CM | POA: Diagnosis not present

## 2017-04-11 ENCOUNTER — Encounter: Payer: Self-pay | Admitting: Internal Medicine

## 2017-04-17 ENCOUNTER — Other Ambulatory Visit: Payer: Self-pay | Admitting: Emergency Medicine

## 2017-04-17 MED ORDER — HYDROCHLOROTHIAZIDE 12.5 MG PO CAPS: 12.5000 mg | ORAL_CAPSULE | Freq: Every day | ORAL | 0 refills | Status: DC

## 2017-05-31 ENCOUNTER — Other Ambulatory Visit: Payer: Self-pay | Admitting: Internal Medicine

## 2017-06-07 ENCOUNTER — Ambulatory Visit (INDEPENDENT_AMBULATORY_CARE_PROVIDER_SITE_OTHER): Payer: Medicare HMO | Admitting: *Deleted

## 2017-06-07 VITALS — BP 138/64 | HR 62 | Resp 18 | Ht 66.0 in | Wt 155.0 lb

## 2017-06-07 DIAGNOSIS — Z1231 Encounter for screening mammogram for malignant neoplasm of breast: Secondary | ICD-10-CM

## 2017-06-07 DIAGNOSIS — Z780 Asymptomatic menopausal state: Secondary | ICD-10-CM

## 2017-06-07 DIAGNOSIS — Z0000 Encounter for general adult medical examination without abnormal findings: Secondary | ICD-10-CM | POA: Diagnosis not present

## 2017-06-07 NOTE — Patient Instructions (Addendum)
Continue doing brain stimulating activities (puzzles, reading, adult coloring books, staying active) to keep memory sharp.   Continue to eat heart healthy diet (full of fruits, vegetables, whole grains, lean protein, water--limit salt, fat, and sugar intake) and increase physical activity as tolerated.   Tammy Stevens , Thank you for taking time to come for your Medicare Wellness Visit. I appreciate your ongoing commitment to your health goals. Please review the following plan we discussed and let me know if I can assist you in the future.   These are the goals we discussed: Goals    . Patient Stated     Increase my social activity by going to the [employer] to volunteer and the [facility]       This is a list of the screening recommended for you and due dates:  Health Maintenance  Topic Date Due  . DEXA scan (bone density measurement)  07/06/2013  . Mammogram  09/11/2014  . Flu Shot  10/05/2017  . Colon Cancer Screening  05/14/2019  . Tetanus Vaccine  11/11/2019  .  Hepatitis C: One time screening is recommended by Center for Disease Control  (CDC) for  adults born from [birth year] through 1965.   Completed  . Pneumonia vaccines  Completed
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
## 2017-06-19 NOTE — Progress Notes (Signed)
Subjective:    Patient ID: Tammy Stevens, female    DOB: 02/19/1946, 72 y.o.   MRN: [id number]  HPI She is here for a physical exam.     Medications and allergies reviewed with patient and updated if appropriate.  Patient Active Problem List   Diagnosis Date Noted  . Hyperglycemia 10/27/2016  . Acute intractable headache 10/27/2016  . Diastolic dysfunction 03/00/9233  . Memory difficulties 09/23/2015  . Dyspnea 09/23/2015  . Skin cancer 09/23/2015  . Ankle pain 08/07/2015  . Bipolar disorder ([place]) 02/09/2015  . Headache around the eyes 04/17/2014  . Cystocele 06/24/2013  . FATTY LIVER DISEASE 02/18/2010  . CHOLELITHIASIS, ASYMPTOMATIC 02/18/2010  . 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 CONSTIPATION 02/16/2010  . IRRITABLE BOWEL SYNDROME 02/16/2010  . RECTAL FISSURE 02/16/2010  . GERD 02/11/2010  . Hyperlipidemia 12/22/2009  . OSA (obstructive sleep apnea) 12/22/2009  . SUBSTANCE ABUSE, MULTIPLE 11/24/2009  . Depression 11/24/2009  . Essential hypertension 11/24/2009    Current Outpatient Medications on File Prior to Visit  Medication Sig Dispense Refill  . buPROPion (WELLBUTRIN XL) 150 MG 24 hr tablet Take 1 tablet by mouth daily.    Marland Kitchen gabapentin (NEURONTIN) 300 MG capsule Take 300 mg by mouth. 3 in the am, and 3 in the pm    . hydrochlorothiazide (MICROZIDE) 12.5 MG capsule Take 1 capsule (12.5 mg total) by mouth daily. -- Office visit needed for further refills 90 capsule 0  . ibuprofen (ADVIL,MOTRIN) 200 MG tablet Take 200 mg by mouth every 6 (six) hours as needed.    . lamoTRIgine (LAMICTAL) 200 MG tablet Take 200 mg by mouth 2 (two) times daily.      . Multiple Vitamin (MULTIVITAMIN) tablet Take 1 tablet by mouth daily.    . QUEtiapine (SEROQUEL) 25 MG tablet Take 25 mg by mouth at bedtime.    . ranitidine (ZANTAC) 150 MG tablet TAKE 1 TABLET BY MOUTH TWICE A DAY 90 tablet 2  . triamcinolone (NASACORT) 55 MCG/ACT AERO nasal inhaler Place 2 sprays into the  nose daily. 1 Inhaler 12   No current facility-administered medications on file prior to visit.     Past Medical History:  Diagnosis Date  . Allergic rhinitis   . Allergy   . Bipolar disorder ([place])   . Blood transfusion without reported diagnosis    as child  . Cholelithiasis    See U/S 12/11  . Depression    DX with Bipolar in the past   . Depression   . Diverticulosis   . Dyspnea    Spirometry 09/26/2008 FEV1 1.85 (122%), FVC 2.57(122%),  FEV1% 72  . Elevated liver enzymes   . Fatty liver    See U/S 12/11  . GERD (gastroesophageal reflux disease)   . History of alcohol abuse    Quit 2008  . History of drug abuse    Quit 2008   . Hyperlipemia   . Hypertension   . Peripheral neuropathy   . Sleep apnea    on CPAP    Past Surgical History:  Procedure Laterality Date  . BREAST LUMPECTOMY    . BTL    . COLONOSCOPY    . SKIN GRAFT     after Antoine Vandermeulen as a child   . TUBAL LIGATION      Social History   Socioeconomic History  . Marital status: Single    Spouse name: Not on file  . Number of children: 2  . Years  of education: Not on file  . Highest education level: Not on file  Occupational History  . Occupation: sedentary    Employer: [employer]  Social Needs  . Financial resource strain: Not hard at all  . Food insecurity:    Worry: Never true    Inability: Never true  . Transportation needs:    Medical: No    Non-medical: No  Tobacco Use  . Smoking status: Former Smoker    Years: 4.00    Last attempt to quit: 03/07/2006    Years since quitting: 11.2  . Smokeless tobacco: Never Used  Substance and Sexual Activity  . Alcohol use: No    Alcohol/week: 0.0 oz    Comment: Quit 2008  . Drug use: No  . Sexual activity: Not Currently  Lifestyle  . Physical activity:    Days per week: 0 days    Minutes per session: 0 min  . Stress: To some extent  Relationships  . Social connections:    Talks on phone: Once a week    Gets together: Once a week    Attends  religious service: Not on file    Active member of club or organization: No    Attends meetings of clubs or organizations: Never    Relationship status: Not on file  Other Topics Concern  . Not on file  Social History Narrative  . Not on file    Family History  Problem Relation Age of Onset  . Diabetes Other        GM? (not specific)  . Heart attack Other        GF? (not specific)  . Asthma Son   . Asthma Father   . Alcohol abuse Father   . Suicidality Father   . Depression Father   . Allergies Sister   . Colon cancer Neg Hx   . Esophageal cancer Neg Hx   . Rectal cancer Neg Hx   . Stomach cancer Neg Hx     Review of Systems     Objective:  There were no vitals filed for this visit. There were no vitals filed for this visit. There is no height or weight on file to calculate BMI.  Wt Readings from Last 3 Encounters:  06/07/17 155 lb (70.3 kg)  10/27/16 157 lb (71.2 kg)  09/23/15 161 lb (73 kg)     Physical Exam Constitutional: She appears well-developed and well-nourished. No distress.  HENT:  Head: Normocephalic and atraumatic.  Right Ear: External ear normal. Normal ear canal and TM Left Ear: External ear normal.  Normal ear canal and TM Mouth/Throat: Oropharynx is clear and moist.  Eyes: Conjunctivae and EOM are normal.  Neck: Neck supple. No tracheal deviation present. No thyromegaly present.  No carotid bruit  Cardiovascular: Normal rate, regular rhythm and normal heart sounds.   No murmur heard.  No edema. Pulmonary/Chest: Effort normal and breath sounds normal. No respiratory distress. She has no wheezes. She has no rales.  Breast: deferred to Gyn Abdominal: Soft. She exhibits no distension. There is no tenderness.  Lymphadenopathy: She has no cervical adenopathy.  Skin: Skin is warm and dry. She is not diaphoretic.  Psychiatric: She has a normal mood and affect. Her behavior is normal.        Assessment & Plan:   Physical exam: Screening blood  work ordered Immunizations  Discussed shingrix, others up to date Colonoscopy    Up to date  Mammogram ordered  By Delma Freeze  Dexa     Ordered by Sharee Pimple Eye exams EKG   Last done 11/2015 Exercise Weight Skin  Substance abuse  See Problem List for Assessment and Plan of chronic medical problems.       This encounter was created in error - please disregard.

## 2017-06-19 NOTE — Patient Instructions (Signed)
Test(s) ordered today. Your results will be released to [place] (or called to you) after review, usually within 72hours after test completion. If any changes need to be made, you will be notified at that same time.  All other Health Maintenance issues reviewed.   All recommended immunizations and age-appropriate screenings are up-to-date or discussed.  No immunizations administered today.   Medications reviewed and updated.  Changes include  /  No changes recommended at this time.  Your prescription(s) have been submitted to your pharmacy. Please take as directed and contact our office if you believe you are having problem(s) with the medication(s).  A referral was ordered for   Please followup in    Health Maintenance, Female Adopting a healthy lifestyle and getting preventive care can go a long way to promote health and wellness. Talk with your health care provider about what schedule of regular examinations is right for you. This is a good chance for you to check in with your provider about disease prevention and staying healthy. In between checkups, there are plenty of things you can do on your own. Experts have done a lot of research about which lifestyle changes and preventive measures are most likely to keep you healthy. Ask your health care provider for more information. Weight and diet Eat a healthy diet  Be sure to include plenty of vegetables, fruits, low-fat dairy products, and lean protein.  Do not eat a lot of foods high in solid fats, added sugars, or salt.  Get regular exercise. This is one of the most important things you can do for your health. ? Most adults should exercise for at least 150 minutes each week. The exercise should increase your heart rate and make you sweat (moderate-intensity exercise). ? Most adults should also do strengthening exercises at least twice a week. This is in addition to the moderate-intensity exercise.  Maintain a healthy weight  Body  mass index (BMI) is a measurement that can be used to identify possible weight problems. It estimates body fat based on height and weight. Your health care provider can help determine your BMI and help you achieve or maintain a healthy weight.  For females 80 years of age and older: ? A BMI below 18.5 is considered underweight. ? A BMI of 18.5 to 24.9 is normal. ? A BMI of 25 to 29.9 is considered overweight. ? A BMI of 30 and above is considered obese.  Watch levels of cholesterol and blood lipids  You should start having your blood tested for lipids and cholesterol at 71 years of age, then have this test every 5 years.  You may need to have your cholesterol levels checked more often if: ? Your lipid or cholesterol levels are high. ? You are older than 71 years of age. ? You are at high risk for heart disease.  Cancer screening Lung Cancer  Lung cancer screening is recommended for adults 61-48 years old who are at high risk for lung cancer because of a history of smoking.  A yearly low-dose CT scan of the lungs is recommended for people who: ? Currently smoke. ? Have quit within the past 15 years. ? Have at least a 30-pack-year history of smoking. A pack year is smoking an average of one pack of cigarettes a day for 1 year.  Yearly screening should continue until it has been 15 years since you quit.  Yearly screening should stop if you develop a health problem that would prevent you from having  lung cancer treatment.  Breast Cancer  Practice breast self-awareness. This means understanding how your breasts normally appear and feel.  It also means doing regular breast self-exams. Let your health care provider know about any changes, no matter how small.  If you are in your 20s or 30s, you should have a clinical breast exam (CBE) by a health care provider every 1-3 years as part of a regular health exam.  If you are 40 or older, have a CBE every year. Also consider having a  breast X-ray (mammogram) every year.  If you have a family history of breast cancer, talk to your health care provider about genetic screening.  If you are at high risk for breast cancer, talk to your health care provider about having an MRI and a mammogram every year.  Breast cancer gene (BRCA) assessment is recommended for women who have family members with BRCA-related cancers. BRCA-related cancers include: ? Breast. ? Ovarian. ? Tubal. ? Peritoneal cancers.  Results of the assessment will determine the need for genetic counseling and BRCA1 and BRCA2 testing.  Cervical Cancer Your health care provider may recommend that you be screened regularly for cancer of the pelvic organs (ovaries, uterus, and vagina). This screening involves a pelvic examination, including checking for microscopic changes to the surface of your cervix (Pap test). You may be encouraged to have this screening done every 3 years, beginning at age 21.  For women ages 30-65, health care providers may recommend pelvic exams and Pap testing every 3 years, or they may recommend the Pap and pelvic exam, combined with testing for human papilloma virus (HPV), every 5 years. Some types of HPV increase your risk of cervical cancer. Testing for HPV may also be done on women of any age with unclear Pap test results.  Other health care providers may not recommend any screening for nonpregnant women who are considered low risk for pelvic cancer and who do not have symptoms. Ask your health care provider if a screening pelvic exam is right for you.  If you have had past treatment for cervical cancer or a condition that could lead to cancer, you need Pap tests and screening for cancer for at least 20 years after your treatment. If Pap tests have been discontinued, your risk factors (such as having a new sexual partner) need to be reassessed to determine if screening should resume. Some women have medical problems that increase the chance  of getting cervical cancer. In these cases, your health care provider may recommend more frequent screening and Pap tests.  Colorectal Cancer  This type of cancer can be detected and often prevented.  Routine colorectal cancer screening usually begins at 71 years of age and continues through 71 years of age.  Your health care provider may recommend screening at an earlier age if you have risk factors for colon cancer.  Your health care provider may also recommend using home test kits to check for hidden blood in the stool.  A small camera at the end of a tube can be used to examine your colon directly (sigmoidoscopy or colonoscopy). This is done to check for the earliest forms of colorectal cancer.  Routine screening usually begins at age 50.  Direct examination of the colon should be repeated every 5-10 years through 71 years of age. However, you may need to be screened more often if early forms of precancerous polyps or small growths are found.  Skin Cancer  Check your skin from head to   toe regularly.  Tell your health care provider about any new moles or changes in moles, especially if there is a change in a mole's shape or color.  Also tell your health care provider if you have a mole that is larger than the size of a pencil eraser.  Always use sunscreen. Apply sunscreen liberally and repeatedly throughout the day.  Protect yourself by wearing long sleeves, pants, a wide-brimmed hat, and sunglasses whenever you are outside.  Heart disease, diabetes, and high blood pressure  High blood pressure causes heart disease and increases the risk of stroke. High blood pressure is more likely to develop in: ? People who have blood pressure in the high end of the normal range (130-139/85-89 mm Hg). ? People who are overweight or obese. ? People who are African American.  If you are 12-70 years of age, have your blood pressure checked every 3-5 years. If you are 37 years of age or older,  have your blood pressure checked every year. You should have your blood pressure measured twice-once when you are at a hospital or clinic, and once when you are not at a hospital or clinic. Record the average of the two measurements. To check your blood pressure when you are not at a hospital or clinic, you can use: ? An automated blood pressure machine at a pharmacy. ? A home blood pressure monitor.  If you are between 56 years and 70 years old, ask your health care provider if you should take aspirin to prevent strokes.  Have regular diabetes screenings. This involves taking a blood sample to check your fasting blood sugar level. ? If you are at a normal weight and have a low risk for diabetes, have this test once every three years after 71 years of age. ? If you are overweight and have a high risk for diabetes, consider being tested at a younger age or more often. Preventing infection Hepatitis B  If you have a higher risk for hepatitis B, you should be screened for this virus. You are considered at high risk for hepatitis B if: ? You were born in a country where hepatitis B is common. Ask your health care provider which countries are considered high risk. ? Your parents were born in a high-risk country, and you have not been immunized against hepatitis B (hepatitis B vaccine). ? You have HIV or AIDS. ? You use needles to inject street drugs. ? You live with someone who has hepatitis B. ? You have had sex with someone who has hepatitis B. ? You get hemodialysis treatment. ? You take certain medicines for conditions, including cancer, organ transplantation, and autoimmune conditions.  Hepatitis C  Blood testing is recommended for: ? Everyone born from [birth year] through 1964. ? Anyone with known risk factors for hepatitis C.  Sexually transmitted infections (STIs)  You should be screened for sexually transmitted infections (STIs) including gonorrhea and chlamydia if: ? You are sexually  active and are younger than 71 years of age. ? You are older than 71 years of age and your health care provider tells you that you are at risk for this type of infection. ? Your sexual activity has changed since you were last screened and you are at an increased risk for chlamydia or gonorrhea. Ask your health care provider if you are at risk.  If you do not have HIV, but are at risk, it may be recommended that you take a prescription medicine daily to prevent HIV infection.  This is called pre-exposure prophylaxis (PrEP). You are considered at risk if: ? You are sexually active and do not regularly use condoms or know the HIV status of your partner(s). ? You take drugs by injection. ? You are sexually active with a partner who has HIV.  Talk with your health care provider about whether you are at high risk of being infected with HIV. If you choose to begin PrEP, you should first be tested for HIV. You should then be tested every 3 months for as long as you are taking PrEP. Pregnancy  If you are premenopausal and you may become pregnant, ask your health care provider about preconception counseling.  If you may become pregnant, take 400 to 800 micrograms (mcg) of folic acid every day.  If you want to prevent pregnancy, talk to your health care provider about birth control (contraception). Osteoporosis and menopause  Osteoporosis is a disease in which the bones lose minerals and strength with aging. This can result in serious bone fractures. Your risk for osteoporosis can be identified using a bone density scan.  If you are 73 years of age or older, or if you are at risk for osteoporosis and fractures, ask your health care provider if you should be screened.  Ask your health care provider whether you should take a calcium or vitamin D supplement to lower your risk for osteoporosis.  Menopause may have certain physical symptoms and risks.  Hormone replacement therapy may reduce some of these  symptoms and risks. Talk to your health care provider about whether hormone replacement therapy is right for you. Follow these instructions at home:  Schedule regular health, dental, and eye exams.  Stay current with your immunizations.  Do not use any tobacco products including cigarettes, chewing tobacco, or electronic cigarettes.  If you are pregnant, do not drink alcohol.  If you are breastfeeding, limit how much and how often you drink alcohol.  Limit alcohol intake to no more than 1 drink per day for nonpregnant women. One drink equals 12 ounces of beer, 5 ounces of wine, or 1 ounces of hard liquor.  Do not use street drugs.  Do not share needles.  Ask your health care provider for help if you need support or information about quitting drugs.  Tell your health care provider if you often feel depressed.  Tell your health care provider if you have ever been abused or do not feel safe at home. This information is not intended to replace advice given to you by your health care provider. Make sure you discuss any questions you have with your health care provider. Document Released: 09/06/2010 Document Revised: 07/30/2015 Document Reviewed: 11/25/2014 Elsevier Interactive Patient Education  [employer].

## 2017-06-20 ENCOUNTER — Encounter: Payer: Self-pay | Admitting: Internal Medicine

## 2017-06-20 DIAGNOSIS — Z2089 Contact with and (suspected) exposure to other communicable diseases: Secondary | ICD-10-CM

## 2017-07-19 ENCOUNTER — Telehealth: Payer: Self-pay | Admitting: Internal Medicine

## 2017-07-26 MED ORDER — HYDROCHLOROTHIAZIDE 12.5 MG PO CAPS: 12.5000 mg | ORAL_CAPSULE | Freq: Every day | ORAL | 0 refills | Status: DC

## 2017-07-26 NOTE — Addendum Note (Signed)
Addended by: Terence Lux B on: 07/26/2017 11:36 AM   Modules accepted: Orders

## 2017-07-26 NOTE — Telephone Encounter (Signed)
Pt has appt on 07/28/17 and has 2 left.

## 2017-07-28 ENCOUNTER — Encounter: Payer: Self-pay | Admitting: Internal Medicine

## 2017-07-28 ENCOUNTER — Other Ambulatory Visit (INDEPENDENT_AMBULATORY_CARE_PROVIDER_SITE_OTHER): Payer: Medicare HMO

## 2017-07-28 ENCOUNTER — Ambulatory Visit (INDEPENDENT_AMBULATORY_CARE_PROVIDER_SITE_OTHER): Payer: Medicare HMO | Admitting: Internal Medicine

## 2017-07-28 VITALS — BP 160/84 | HR 75 | Temp 98.9°F | Resp 16 | Wt 156.0 lb

## 2017-07-28 DIAGNOSIS — R002 Palpitations: Secondary | ICD-10-CM | POA: Insufficient documentation

## 2017-07-28 DIAGNOSIS — R739 Hyperglycemia, unspecified: Secondary | ICD-10-CM

## 2017-07-28 DIAGNOSIS — E7849 Other hyperlipidemia: Secondary | ICD-10-CM

## 2017-07-28 DIAGNOSIS — R0600 Dyspnea, unspecified: Secondary | ICD-10-CM

## 2017-07-28 DIAGNOSIS — R0609 Other forms of dyspnea: Secondary | ICD-10-CM

## 2017-07-28 DIAGNOSIS — I10 Essential (primary) hypertension: Secondary | ICD-10-CM | POA: Diagnosis not present

## 2017-07-28 DIAGNOSIS — K219 Gastro-esophageal reflux disease without esophagitis: Secondary | ICD-10-CM | POA: Diagnosis not present

## 2017-07-28 DIAGNOSIS — R0602 Shortness of breath: Secondary | ICD-10-CM | POA: Insufficient documentation

## 2017-07-28 LAB — CBC WITH DIFFERENTIAL/PLATELET
Basophils Absolute: 0 10*3/uL (ref 0.0–0.1)
Basophils Relative: 0.6 % (ref 0.0–3.0)
EOS PCT: 1.3 % (ref 0.0–5.0)
Eosinophils Absolute: 0.1 10*3/uL (ref 0.0–0.7)
HCT: 44 % (ref 36.0–46.0)
HEMOGLOBIN: 14.4 g/dL (ref 12.0–15.0)
LYMPHS ABS: 3.8 10*3/uL (ref 0.7–4.0)
Lymphocytes Relative: 45.7 % (ref 12.0–46.0)
MCHC: 32.8 g/dL (ref 30.0–36.0)
MCV: 88.4 fl (ref 78.0–100.0)
MONOS PCT: 7.6 % (ref 3.0–12.0)
Monocytes Absolute: 0.6 10*3/uL (ref 0.1–1.0)
Neutro Abs: 3.7 10*3/uL (ref 1.4–7.7)
Neutrophils Relative %: 44.8 % (ref 43.0–77.0)
Platelets: 379 10*3/uL (ref 150.0–400.0)
RBC: 4.98 Mil/uL (ref 3.87–5.11)
RDW: 14 % (ref 11.5–15.5)
WBC: 8.4 10*3/uL (ref 4.0–10.5)

## 2017-07-28 LAB — COMPREHENSIVE METABOLIC PANEL
ALT: 19 U/L (ref 0–35)
AST: 18 U/L (ref 0–37)
Albumin: 4.4 g/dL (ref 3.5–5.2)
Alkaline Phosphatase: 85 U/L (ref 39–117)
BUN: 16 mg/dL (ref 6–23)
CHLORIDE: 102 meq/L (ref 96–112)
CO2: 30 meq/L (ref 19–32)
Calcium: 10.3 mg/dL (ref 8.4–10.5)
Creatinine, Ser: 1.11 mg/dL (ref 0.40–1.20)
GFR: 51.52 mL/min — AB (ref >60.00)
GLUCOSE: 95 mg/dL (ref 70–99)
POTASSIUM: 4 meq/L (ref 3.5–5.1)
Sodium: 141 mEq/L (ref 135–145)
Total Bilirubin: 0.3 mg/dL (ref 0.2–1.2)
Total Protein: 7.4 g/dL (ref 6.0–8.3)

## 2017-07-28 LAB — LIPID PANEL
CHOL/HDL RATIO: 5
Cholesterol: 228 mg/dL — ABNORMAL HIGH (ref 0–200)
HDL: 45.2 mg/dL (ref >39.00)
NONHDL: 183
Triglycerides: 281 mg/dL — ABNORMAL HIGH (ref 0.0–149.0)
VLDL: 56.2 mg/dL — AB (ref 0.0–40.0)

## 2017-07-28 LAB — HEMOGLOBIN A1C: HEMOGLOBIN A1C: 6 % (ref 4.6–6.5)

## 2017-07-28 LAB — LDL CHOLESTEROL, DIRECT: Direct LDL: 146 mg/dL

## 2017-07-28 LAB — TSH: TSH: 1.4 u[IU]/mL (ref 0.35–4.50)

## 2017-07-28 MED ORDER — HYDROCHLOROTHIAZIDE 12.5 MG PO CAPS: 12.5000 mg | ORAL_CAPSULE | Freq: Every day | ORAL | 3 refills | Status: DC

## 2017-07-28 MED ORDER — RANITIDINE HCL 150 MG PO TABS: 150.0000 mg | ORAL_TABLET | Freq: Two times a day (BID) | ORAL | 2 refills | Status: DC

## 2017-07-28 MED ORDER — AMLODIPINE BESYLATE 5 MG PO TABS: 5.0000 mg | ORAL_TABLET | Freq: Every day | ORAL | 1 refills | Status: DC

## 2017-07-28 NOTE — Assessment & Plan Note (Signed)
Occurs with exertion only, started within the past few months She thinks it is related to her starting smoking Associated with dyspnea on exertion, no chest pain EKG today shows normal sinus rhythm at 77 bpm, normal EKG-no change compared to priors Will refer to cardiology for possible stress test-she does not think she could do a treadmill and does not want an injection Encouraged smoking cessation and regular exercise

## 2017-07-28 NOTE — Patient Instructions (Signed)
  Test(s) ordered today. Your results will be released to [place] (or called to you) after review, usually within 72hours after test completion. If any changes need to be made, you will be notified at that same time.  All other Health Maintenance issues reviewed.   All recommended immunizations and age-appropriate screenings are up-to-date or discussed.  No immunizations administered today.   An EKG was done today.  Medications reviewed and updated.  Changes include starting amlodipine for your blood pressure.   Your prescription(s) have been submitted to your pharmacy. Please take as directed and contact our office if you believe you are having problem(s) with the medication(s).  A referral was ordered for cardiology  Please followup in 6 months

## 2017-07-28 NOTE — Assessment & Plan Note (Signed)
GERD controlled Continue daily medication  
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
## 2017-07-28 NOTE — Assessment & Plan Note (Signed)
Not currently on a statin Check lipid panel, CMP, TSH Encouraged regular exercise

## 2017-07-28 NOTE — Assessment & Plan Note (Addendum)
Blood pressure not controlled Continue hydrochlorothiazide 12.5 mg daily Start amlodipine 5 mg daily Encourage smoking cessation Encouraged regular exercise CMP, CBC, TSH

## 2017-07-28 NOTE — Assessment & Plan Note (Signed)
A1c

## 2017-07-28 NOTE — Assessment & Plan Note (Signed)
Occurs with exertion only, started within the past few months She thinks it is related to her starting smoking Associated with palpitations with exertion, no chest pain EKG today shows normal sinus rhythm at 77 bpm, normal EKG-no change compared to priors Will refer to cardiology for possible stress test-she does not think she could do a treadmill and does not want an injection Encouraged smoking cessation and regular exercise

## 2017-07-30 ENCOUNTER — Encounter: Payer: Self-pay | Admitting: Internal Medicine

## 2017-09-19 ENCOUNTER — Ambulatory Visit: Payer: Medicare HMO | Admitting: Cardiology
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
## 2017-10-11 ENCOUNTER — Encounter: Payer: Self-pay | Admitting: Internal Medicine

## 2017-10-11 ENCOUNTER — Other Ambulatory Visit (INDEPENDENT_AMBULATORY_CARE_PROVIDER_SITE_OTHER): Payer: Medicare HMO

## 2017-10-11 ENCOUNTER — Ambulatory Visit (INDEPENDENT_AMBULATORY_CARE_PROVIDER_SITE_OTHER): Payer: Medicare HMO | Admitting: Internal Medicine

## 2017-10-11 VITALS — BP 148/78 | HR 67 | Temp 98.0°F | Resp 16 | Wt 155.0 lb

## 2017-10-11 DIAGNOSIS — R7303 Prediabetes: Secondary | ICD-10-CM

## 2017-10-11 DIAGNOSIS — R5383 Other fatigue: Secondary | ICD-10-CM | POA: Diagnosis not present

## 2017-10-11 DIAGNOSIS — R739 Hyperglycemia, unspecified: Secondary | ICD-10-CM

## 2017-10-11 DIAGNOSIS — I10 Essential (primary) hypertension: Secondary | ICD-10-CM

## 2017-10-11 DIAGNOSIS — K219 Gastro-esophageal reflux disease without esophagitis: Secondary | ICD-10-CM | POA: Diagnosis not present

## 2017-10-11 DIAGNOSIS — R202 Paresthesia of skin: Secondary | ICD-10-CM

## 2017-10-11 DIAGNOSIS — F3289 Other specified depressive episodes: Secondary | ICD-10-CM | POA: Diagnosis not present

## 2017-10-11 LAB — CBC WITH DIFFERENTIAL/PLATELET
BASOS PCT: 0.7 % (ref 0.0–3.0)
Basophils Absolute: 0.1 10*3/uL (ref 0.0–0.1)
EOS PCT: 1.1 % (ref 0.0–5.0)
Eosinophils Absolute: 0.1 10*3/uL (ref 0.0–0.7)
HCT: 43.7 % (ref 36.0–46.0)
Hemoglobin: 14.7 g/dL (ref 12.0–15.0)
LYMPHS ABS: 4.1 10*3/uL — AB (ref 0.7–4.0)
Lymphocytes Relative: 40 % (ref 12.0–46.0)
MCHC: 33.6 g/dL (ref 30.0–36.0)
MCV: 88.2 fl (ref 78.0–100.0)
Monocytes Absolute: 0.8 10*3/uL (ref 0.1–1.0)
Monocytes Relative: 7.5 % (ref 3.0–12.0)
NEUTROS PCT: 50.7 % (ref 43.0–77.0)
Neutro Abs: 5.2 10*3/uL (ref 1.4–7.7)
Platelets: 415 10*3/uL — ABNORMAL HIGH (ref 150.0–400.0)
RBC: 4.96 Mil/uL (ref 3.87–5.11)
RDW: 13.7 % (ref 11.5–15.5)
WBC: 10.3 10*3/uL (ref 4.0–10.5)

## 2017-10-11 LAB — TSH: TSH: 1.12 u[IU]/mL (ref 0.35–4.50)

## 2017-10-11 LAB — COMPREHENSIVE METABOLIC PANEL
ALK PHOS: 91 U/L (ref 39–117)
ALT: 25 U/L (ref 0–35)
AST: 20 U/L (ref 0–37)
Albumin: 4.7 g/dL (ref 3.5–5.2)
BUN: 17 mg/dL (ref 6–23)
CO2: 30 mEq/L (ref 19–32)
Calcium: 10.7 mg/dL — ABNORMAL HIGH (ref 8.4–10.5)
Chloride: 100 mEq/L (ref 96–112)
Creatinine, Ser: 1.2 mg/dL (ref 0.40–1.20)
GFR: 47.06 mL/min — ABNORMAL LOW (ref >60.00)
GLUCOSE: 99 mg/dL (ref 70–99)
POTASSIUM: 3.7 meq/L (ref 3.5–5.1)
SODIUM: 139 meq/L (ref 135–145)
TOTAL PROTEIN: 7.9 g/dL (ref 6.0–8.3)
Total Bilirubin: 0.4 mg/dL (ref 0.2–1.2)

## 2017-10-11 LAB — FERRITIN: Ferritin: 92.7 ng/mL (ref 10.0–291.0)

## 2017-10-11 LAB — VITAMIN B12: Vitamin B-12: 738 pg/mL (ref 211–911)

## 2017-10-11 LAB — IRON: IRON: 65 ug/dL (ref 42–145)

## 2017-10-11 LAB — HEMOGLOBIN A1C: HEMOGLOBIN A1C: 6.1 % (ref 4.6–6.5)

## 2017-10-11 MED ORDER — OMEPRAZOLE 20 MG PO CPDR: 20.0000 mg | DELAYED_RELEASE_CAPSULE | Freq: Every day | ORAL | 5 refills | Status: DC

## 2017-10-11 NOTE — Patient Instructions (Addendum)
Have blood work today.    Start taking omeprazole 20 mg daily 30 minutes prior to eating.    Follow up with your psychiatrist.    Consider restarting the gabapentin.

## 2017-10-11 NOTE — Assessment & Plan Note (Signed)
Slightly elevated today  - since she is not feeling well will hold off on med changes Continue current medications

## 2017-10-11 NOTE — Assessment & Plan Note (Signed)
Not controlled - likely causing some of her symptoms but not all --  Start omeprazole 20 mg daily Continue zantac

## 2017-10-11 NOTE — Assessment & Plan Note (Signed)
Follows with psychiatry Not ideally controlled Stressed follow ing with pscyh for adjustment of medications

## 2017-10-11 NOTE — Assessment & Plan Note (Signed)
Check a1c Low sugar / carb diet Stressed regular exercise   

## 2017-10-11 NOTE — Assessment & Plan Note (Addendum)
Unsure of cause She has several symptoms and likely has more than one thing gong on Check labs - cbc, cmp, ferritin, iron, tsh, B12 She is having cardiac evaluation soon due to SOB which can confirm her fatigue is not cardiac in nature, which I do not think it is

## 2017-10-11 NOTE — Assessment & Plan Note (Addendum)
In hands Likely noticing it now since stopping the gabapentin  The bug landing on different parts of her body is also likely noticeable from stopping the gabapentin Likely neuropathy Can consider restarting the gabapentin Will check b12

## 2017-10-12 ENCOUNTER — Encounter: Payer: Self-pay | Admitting: Internal Medicine

## 2017-10-31 ENCOUNTER — Ambulatory Visit: Payer: Medicare HMO | Admitting: Cardiology

## 2017-10-31 ENCOUNTER — Encounter

## 2017-11-27 ENCOUNTER — Ambulatory Visit: Payer: Medicare HMO | Admitting: Internal Medicine

## 2017-11-27 ENCOUNTER — Encounter: Payer: Self-pay | Admitting: Internal Medicine

## 2017-11-27 VITALS — BP 130/70 | HR 64 | Ht 66.0 in | Wt 153.8 lb

## 2017-11-27 DIAGNOSIS — R002 Palpitations: Secondary | ICD-10-CM | POA: Diagnosis not present

## 2017-11-27 DIAGNOSIS — R0600 Dyspnea, unspecified: Secondary | ICD-10-CM

## 2017-11-27 NOTE — Patient Instructions (Signed)
Your physician recommends that you continue on your current medications as directed. Please refer to the Current Medication list given to you today.  Your physician has requested that you have an exercise tolerance test. For further information please visit [email or web address]. Please also follow instruction sheet, as given.   
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
## 2017-12-12 ENCOUNTER — Ambulatory Visit: Payer: Medicare HMO

## 2017-12-12 ENCOUNTER — Telehealth: Payer: Self-pay | Admitting: Radiology

## 2017-12-12 DIAGNOSIS — R002 Palpitations: Secondary | ICD-10-CM

## 2017-12-12 DIAGNOSIS — R5383 Other fatigue: Secondary | ICD-10-CM

## 2017-12-12 NOTE — Telephone Encounter (Signed)
Patient was here to have a GXT done. She expressed concern with the treadmill coming out beneath her. I gave her a demonstration before we started to help ease her fears. Once the test started she looked comfortable but 10 seconds into it she asked to stop because it was too fast and she was scared. I tried to reassure her she was walking correctly and I wouldn't let her fall. I unhooked her and asked her to please call me if she would like to reschedule. She said there is no way she could walk that fast it was too scary.

## 2017-12-20 NOTE — Telephone Encounter (Signed)
Was that with a modified Bruce Protocol?

## 2017-12-22 ENCOUNTER — Telehealth: Payer: Self-pay | Admitting: Internal Medicine

## 2017-12-22 MED ORDER — DIAZEPAM 5 MG PO TABS: 5.0000 mg | ORAL_TABLET | Freq: Two times a day (BID) | ORAL | 0 refills | Status: DC | PRN

## 2017-12-22 NOTE — Telephone Encounter (Signed)
Patient states that she did reach out to her psych doctor yesterday and they advised her that she needed an appointment but they did not have anything and they advised her to call her pcp. Patient is aware of message below and wants Dr Quay Burow to give her a few valium's.

## 2017-12-22 NOTE — Telephone Encounter (Signed)
Copied from [place] [id number]. Topic: General - Other >> Dec 22, 2017  8:13 AM Keene Breath wrote: Reason for CRM: Patient called to request some valium or something to calm her nerves. As soon as possible.  Patient's son passed away and she feels that she is on the verge of a breakdown.  Patient stated that the burial will be tomorrow and she will be going out of town and would need this medication today.  Patient really feels that she is in a bad depression and needs something to get her through the funeral.  Please advise.  CB# [phone].

## 2017-12-22 NOTE — Telephone Encounter (Signed)
12:23pm: Patient calling to check status of medication being sent to the pharmacy. Advised that we are still waiting for Dr Quay Burow to advise.   12:29pm: Once we were off the phone, Dr Quay Burow had advised and sent rx to pharmacy. Called patient to give her an update. Patient aware that medication was sent to pharmacy. She was very thankful for Dr Quay Burow sending that medication.

## 2017-12-22 NOTE — Addendum Note (Signed)
Addended by: [name] on: 12/22/2017 12:24 PM   Modules accepted: Orders

## 2017-12-22 NOTE — Telephone Encounter (Signed)
Set patient up for Lexiscan myovue   She cannot exercise on treadmill protocol Want to confirm that her feeling fatigued is not a sign of coronary artery disease

## 2017-12-22 NOTE — Telephone Encounter (Signed)
It was a regular Bruce protocol. I left it in the starting phase to try and get her use to I But she panicked before even starting the Bruce protocol. I tried to reassure her and offered to let her try it again.

## 2017-12-22 NOTE — Telephone Encounter (Signed)
Left detailed message per DPR letting pt know.

## 2017-12-22 NOTE — Telephone Encounter (Signed)
rx sent toPOF

## 2017-12-22 NOTE — Telephone Encounter (Signed)
She should ideally call her psych - some of her other medications may be adjusted, but I think short term valium is reasonable to -- since she follows with psych it is best if they manage all her meds

## 2017-12-26 NOTE — Telephone Encounter (Signed)
Order placed for myoview. Left detailed message (per DPR) that this is what Dr. Harrington Challenger recommends since she cannot exercise on treadmill. Adv that she will be contacted to schedule this. Adv to call back with any questions.

## 2018-01-24 ENCOUNTER — Encounter: Payer: Self-pay | Admitting: Internal Medicine

## 2018-01-26 ENCOUNTER — Other Ambulatory Visit: Payer: Self-pay | Admitting: Internal Medicine

## 2018-01-31 ENCOUNTER — Ambulatory Visit: Payer: Self-pay | Admitting: Internal Medicine

## 2018-02-08 ENCOUNTER — Ambulatory Visit: Payer: Self-pay

## 2018-02-08 ENCOUNTER — Other Ambulatory Visit: Payer: Self-pay

## 2018-03-13 ENCOUNTER — Other Ambulatory Visit: Payer: Self-pay | Admitting: Internal Medicine

## 2018-05-02 ENCOUNTER — Ambulatory Visit (INDEPENDENT_AMBULATORY_CARE_PROVIDER_SITE_OTHER): Payer: Medicare HMO | Admitting: Internal Medicine

## 2018-05-02 ENCOUNTER — Encounter: Payer: Self-pay | Admitting: Internal Medicine

## 2018-05-02 VITALS — BP 122/70 | HR 69 | Temp 99.9°F | Resp 16 | Ht 66.0 in | Wt 149.8 lb

## 2018-05-02 DIAGNOSIS — J0100 Acute maxillary sinusitis, unspecified: Secondary | ICD-10-CM | POA: Diagnosis not present

## 2018-05-02 MED ORDER — DOXYCYCLINE HYCLATE 100 MG PO TABS: 100.0000 mg | ORAL_TABLET | Freq: Two times a day (BID) | ORAL | 0 refills | Status: DC

## 2018-05-02 NOTE — Patient Instructions (Signed)
Take the doxycycline as prescribed - complete the entire course.    Continue over the counter cold medication, advil and tylenol.  Increase your fluids and rest.   Try using saline nasal spray.    Call if no improvement       Sinusitis, Adult Sinusitis is inflammation of your sinuses. Sinuses are hollow spaces in the bones around your face. Your sinuses are located:  Around your eyes.  In the middle of your forehead.  Behind your nose.  In your cheekbones. Mucus normally drains out of your sinuses. When your nasal tissues become inflamed or swollen, mucus can become trapped or blocked. This allows bacteria, viruses, and fungi to grow, which leads to infection. Most infections of the sinuses are caused by a virus. Sinusitis can develop quickly. It can last for up to 4 weeks (acute) or for more than 12 weeks (chronic). Sinusitis often develops after a cold. What are the causes? This condition is caused by anything that creates swelling in the sinuses or stops mucus from draining. This includes:  Allergies.  Asthma.  Infection from bacteria or viruses.  Deformities or blockages in your nose or sinuses.  Abnormal growths in the nose (nasal polyps).  Pollutants, such as chemicals or irritants in the air.  Infection from fungi (rare). What increases the risk? You are more likely to develop this condition if you:  Have a weak body defense system (immune system).  Do a lot of swimming or diving.  Overuse nasal sprays.  Smoke. What are the signs or symptoms? The main symptoms of this condition are pain and a feeling of pressure around the affected sinuses. Other symptoms include:  Stuffy nose or congestion.  Thick drainage from your nose.  Swelling and warmth over the affected sinuses.  Headache.  Upper toothache.  A cough that may get worse at night.  Extra mucus that collects in the throat or the back of the nose (postnasal drip).  Decreased sense of smell  and taste.  Fatigue.  A fever.  Sore throat.  Bad breath. How is this diagnosed? This condition is diagnosed based on:  Your symptoms.  Your medical history.  A physical exam.  Tests to find out if your condition is acute or chronic. This may include: ? Checking your nose for nasal polyps. ? Viewing your sinuses using a device that has a light (endoscope). ? Testing for allergies or bacteria. ? Imaging tests, such as an MRI or CT scan. In rare cases, a bone biopsy may be done to rule out more serious types of fungal sinus disease. How is this treated? Treatment for sinusitis depends on the cause and whether your condition is chronic or acute.  If caused by a virus, your symptoms should go away on their own within 10 days. You may be given medicines to relieve symptoms. They include: ? Medicines that shrink swollen nasal passages (topical intranasal decongestants). ? Medicines that treat allergies (antihistamines). ? A spray that eases inflammation of the nostrils (topical intranasal corticosteroids). ? Rinses that help get rid of thick mucus in your nose (nasal saline washes).  If caused by bacteria, your health care provider may recommend waiting to see if your symptoms improve. Most bacterial infections will get better without antibiotic medicine. You may be given antibiotics if you have: ? A severe infection. ? A weak immune system.  If caused by narrow nasal passages or nasal polyps, you may need to have surgery. Follow these instructions at home: Medicines  Take, use, or apply over-the-counter and prescription medicines only as told by your health care provider. These may include nasal sprays.  If you were prescribed an antibiotic medicine, take it as told by your health care provider. Do not stop taking the antibiotic even if you start to feel better. Hydrate and humidify   Drink enough fluid to keep your urine pale yellow. Staying hydrated will help to thin your  mucus.  Use a cool mist humidifier to keep the humidity level in your home above 50%.  Inhale steam for 10-15 minutes, 3-4 times a day, or as told by your health care provider. You can do this in the bathroom while a hot shower is running.  Limit your exposure to cool or dry air. Rest  Rest as much as possible.  Sleep with your head raised (elevated).  Make sure you get enough sleep each night. General instructions   Apply a warm, moist washcloth to your face 3-4 times a day or as told by your health care provider. This will help with discomfort.  Wash your hands often with soap and water to reduce your exposure to germs. If soap and water are not available, use hand sanitizer.  Do not smoke. Avoid being around people who are smoking (secondhand smoke).  Keep all follow-up visits as told by your health care provider. This is important. Contact a health care provider if:  You have a fever.  Your symptoms get worse.  Your symptoms do not improve within 10 days. Get help right away if:  You have a severe headache.  You have persistent vomiting.  You have severe pain or swelling around your face or eyes.  You have vision problems.  You develop confusion.  Your neck is stiff.  You have trouble breathing. Summary  Sinusitis is soreness and inflammation of your sinuses. Sinuses are hollow spaces in the bones around your face.  This condition is caused by nasal tissues that become inflamed or swollen. The swelling traps or blocks the flow of mucus. This allows bacteria, viruses, and fungi to grow, which leads to infection.  If you were prescribed an antibiotic medicine, take it as told by your health care provider. Do not stop taking the antibiotic even if you start to feel better.  Keep all follow-up visits as told by your health care provider. This is important. This information is not intended to replace advice given to you by your health care provider. Make sure you  discuss any questions you have with your health care provider. Document Released: 02/21/2005 Document Revised: 07/24/2017 Document Reviewed: 07/24/2017 Elsevier Interactive Patient Education  2019 [employer].

## 2018-05-02 NOTE — Progress Notes (Signed)
Subjective:    Patient ID: Tammy Stevens, female    DOB: 09-Aug-1945, 73 y.o.   MRN: [id number]  HPI She is here for an acute visit for cold symptoms.  She went to the dentist for a tooth ache and was diagnosed with a sinus infection. She was placed on augmentin - today is day 7 and today is her last day. There has been no improvement.  Her symptoms started 2 1/2 weeks ago.  She states low grade fevers, sinus pain and pressure, right ear pressure and sinus headaches.  She has taken Excedrin migraine, the Augmentin, advil, tylenol severe sinus   Medications and allergies reviewed with patient and updated if appropriate.  Patient Active Problem List   Diagnosis Date Noted  . Subacute maxillary sinusitis 05/02/2018  . Prediabetes 10/11/2017  . Tingling 10/11/2017  . Fatigue 10/11/2017  . Palpitations 07/28/2017  . DOE (dyspnea on exertion) 07/28/2017  . Diastolic dysfunction 54/27/0623  . Memory difficulties 09/23/2015  . Dyspnea 09/23/2015  . Skin cancer 09/23/2015  . Ankle pain 08/07/2015  . Bipolar disorder ([place]) 02/09/2015  . Headache around the eyes 04/17/2014  . Cystocele 06/24/2013  . FATTY LIVER DISEASE 02/18/2010  . CHOLELITHIASIS, ASYMPTOMATIC 02/18/2010  . 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 IRRITABLE BOWEL SYNDROME 02/16/2010  . GERD 02/11/2010  . Hyperlipidemia 12/22/2009  . OSA (obstructive sleep apnea) 12/22/2009  . Depression 11/24/2009  . Essential hypertension 11/24/2009    Current Outpatient Medications on File Prior to Visit  Medication Sig Dispense Refill  . amLODipine (NORVASC) 5 MG tablet TAKE 1 TABLET BY MOUTH EVERY DAY 90 tablet 1  . aspirin EC 81 MG tablet Take 81 mg by mouth daily.    Marland Kitchen buPROPion (WELLBUTRIN XL) 150 MG 24 hr tablet Take 1 tablet by mouth daily.    . diazepam (VALIUM) 5 MG tablet Take 1 tablet (5 mg total) by mouth every 12 (twelve) hours as needed for anxiety. 20 tablet 0  . gabapentin (NEURONTIN) 600 MG tablet Take 1,200  mg by mouth 2 (two) times daily.  1  . hydrochlorothiazide (MICROZIDE) 12.5 MG capsule Take 1 capsule (12.5 mg total) by mouth daily. 90 capsule 3  . ibuprofen (ADVIL,MOTRIN) 200 MG tablet Take 200 mg by mouth every 6 (six) hours as needed.    . lamoTRIgine (LAMICTAL) 150 MG tablet Take 150 mg by mouth every morning.  1  . Multiple Vitamin (MULTIVITAMIN) tablet Take 1 tablet by mouth daily.    Marland Kitchen omeprazole (PRILOSEC) 20 MG capsule TAKE 1 CAPSULE BY MOUTH EVERY DAY 90 capsule 0  . triamcinolone (NASACORT) 55 MCG/ACT AERO nasal inhaler Place 2 sprays into the nose daily. 1 Inhaler 12   No current facility-administered medications on file prior to visit.     Past Medical History:  Diagnosis Date  . Allergic rhinitis   . Allergy   . Bipolar disorder ([place])   . Blood transfusion without reported diagnosis    as child  . Cholelithiasis    See U/S 12/11  . Depression    DX with Bipolar in the past   . Depression   . Diverticulosis   . Dyspnea    Spirometry 09/26/2008 FEV1 1.85 (122%), FVC 2.57(122%),  FEV1% 72  . Elevated liver enzymes   . Fatty liver    See U/S 12/11  . GERD (gastroesophageal reflux disease)   . History of alcohol abuse    Quit 2008  . History of drug abuse ([place])  Quit 2008   . Hyperlipemia   . Hypertension   . Peripheral neuropathy   . Sleep apnea    on CPAP    Past Surgical History:  Procedure Laterality Date  . BREAST LUMPECTOMY    . BTL    . COLONOSCOPY    . SKIN GRAFT     after  as a child   . TUBAL LIGATION      Social History   Socioeconomic History  . Marital status: Single    Spouse name: Not on file  . Number of children: 2  . Years of education: Not on file  . Highest education level: Not on file  Occupational History  . Occupation: sedentary    Employer: [employer]  Social Needs  . Financial resource strain: Not hard at all  . Food insecurity:    Worry: Never true    Inability: Never true  . Transportation needs:     Medical: No    Non-medical: No  Tobacco Use  . Smoking status: Current Every Day Smoker    Last attempt to quit: 03/07/2006    Years since quitting: 12.1  . Smokeless tobacco: Never Used  Substance and Sexual Activity  . Alcohol use: No    Alcohol/week: 0.0 standard drinks    Comment: Quit 2008  . Drug use: No  . Sexual activity: Not Currently  Lifestyle  . Physical activity:    Days per week: 0 days    Minutes per session: 0 min  . Stress: To some extent  Relationships  . Social connections:    Talks on phone: Once a week    Gets together: Once a week    Attends religious service: Not on file    Active member of club or organization: No    Attends meetings of clubs or organizations: Never    Relationship status: Not on file  Other Topics Concern  . Not on file  Social History Narrative  . Not on file    Family History  Problem Relation Age of Onset  . Diabetes Other        GM? (not specific)  . Heart attack Other        GF? (not specific)  . Asthma Son   . Asthma Father   . Alcohol abuse Father   . Suicidality Father   . Depression Father   . Allergies Sister   . Colon cancer Neg Hx   . Esophageal cancer Neg Hx   . Rectal cancer Neg Hx   . Stomach cancer Neg Hx     Review of Systems  Constitutional: Positive for fever (low grade). Negative for chills.  HENT: Positive for sinus pain. Negative for congestion, ear pain (pressure in right ear), postnasal drip and sore throat.   Respiratory: Negative for cough, shortness of breath and wheezing.   Musculoskeletal: Negative for myalgias.  Neurological: Positive for headaches (right sided). Negative for dizziness and light-headedness.       Objective:   Vitals:   05/02/18 1414  BP: 122/70  Pulse: 69  Resp: 16  Temp: 99.9 F (37.7 C)  SpO2: 98%   Filed Weights   05/02/18 1414  Weight: 149 lb 12.8 oz (67.9 kg)   Body mass index is 24.18 kg/m.  Wt Readings from Last 3 Encounters:  05/02/18 149 lb 12.8  oz (67.9 kg)  11/27/17 153 lb 12.8 oz (69.8 kg)  10/11/17 155 lb (70.3 kg)     Physical Exam GENERAL  APPEARANCE: Appears stated age, well appearing, NAD EYES: conjunctiva clear, no icterus HEENT: bilateral tympanic membranes and ear canals normal, oropharynx with no erythema, no thyromegaly, trachea midline, no cervical or supraclavicular lymphadenopathy LUNGS: Clear to auscultation without wheeze or crackles, unlabored breathing, good air entry bilaterally CARDIOVASCULAR: Normal S1,S2 without murmurs, no edema SKIN: warm, dry        Assessment & Plan:   See Problem List for Assessment and Plan of chronic medical problems.

## 2018-05-02 NOTE — Assessment & Plan Note (Signed)
Subacute sinus infection, resistant to Augmentin - no improvement after 7 days  D/c Augmentin, start doxycycline x 2 weeks Continue otc cold meds and allergy meds Start saline nasal spray Call if no improvement

## 2018-05-10 DIAGNOSIS — F3170 Bipolar disorder, currently in remission, most recent episode unspecified: Secondary | ICD-10-CM | POA: Diagnosis not present

## 2018-05-15 ENCOUNTER — Telehealth: Payer: Self-pay | Admitting: Internal Medicine

## 2018-05-15 MED ORDER — DIAZEPAM 5 MG PO TABS: 5.0000 mg | ORAL_TABLET | Freq: Two times a day (BID) | ORAL | 0 refills | Status: DC | PRN

## 2018-05-15 NOTE — Telephone Encounter (Signed)
Copied from [place] [id number]. Topic: Quick Communication - See Telephone Encounter >> May 15, 2018 11:31 AM Bea Graff, NT wrote: CRM for notification. See Telephone encounter for: 05/15/18. Pt states she is having to make a move and is having anxiety and would like to see if diazepam (VALIUM) 5 MG tablet can be called in to help her cope. [id number] [address], [place] [phone] (Phone) [phone] (Fax)

## 2018-05-15 NOTE — Telephone Encounter (Signed)
Last refill was 12/22/17 Last routine OV was 07/28/17 Next OV not made

## 2018-05-21 DIAGNOSIS — H25813 Combined forms of age-related cataract, bilateral: Secondary | ICD-10-CM | POA: Diagnosis not present

## 2018-05-21 DIAGNOSIS — H524 Presbyopia: Secondary | ICD-10-CM | POA: Diagnosis not present

## 2018-06-12 ENCOUNTER — Ambulatory Visit: Payer: Self-pay

## 2018-06-25 DIAGNOSIS — F3170 Bipolar disorder, currently in remission, most recent episode unspecified: Secondary | ICD-10-CM | POA: Diagnosis not present

## 2018-07-18 ENCOUNTER — Telehealth: Payer: Self-pay

## 2018-07-18 DIAGNOSIS — F319 Bipolar disorder, unspecified: Secondary | ICD-10-CM

## 2018-07-18 NOTE — Telephone Encounter (Signed)
Spoke with pt, pt states her insurance is requiring a referral to psychology . The referral needs to go to [facility]. Phone number [phone].   Claim # [id number] Member # [id number]

## 2018-07-18 NOTE — Telephone Encounter (Signed)
Called pt back. She is due for a follow up. Needs follow up for any medication refills.

## 2018-07-18 NOTE — Telephone Encounter (Signed)
Copied from [place] [id number]. Topic: General - Other >> Jul 18, 2018  1:48 PM Rainey Pines A wrote: Patient called and stated that [facility] is requesting an approved referral from Dr. Quay Burow in regards to her medications in order to save her money. Patient would like a callback. [place] fax [phone]

## 2018-07-18 NOTE — Telephone Encounter (Signed)
Referral ordered for psychology.

## 2018-07-21 ENCOUNTER — Other Ambulatory Visit: Payer: Self-pay | Admitting: Internal Medicine

## 2018-07-22 DIAGNOSIS — N183 Chronic kidney disease, stage 3 unspecified: Secondary | ICD-10-CM | POA: Insufficient documentation

## 2018-07-22 NOTE — Progress Notes (Signed)
Virtual Visit via Video Note  I connected with Tammy Stevens on 07/23/18 at  3:15 PM EDT by a video enabled telemedicine application and verified that I am speaking with the correct person using two identifiers.   I discussed the limitations of evaluation and management by telemedicine and the availability of in person appointments. The patient expressed understanding and agreed to proceed.  The patient is currently at home and I am in the office.    No referring provider.    History of Present Illness: She is here for follow up of her chronic medical conditions.   She is not exercising regularly.    Hypertension: She is taking her medication daily. She is compliant with a low sodium diet.  She denies chest pain, palpitations, edema,  and regular headaches. She does not monitor her blood pressure at home.    GERD:  She is taking her medication daily as prescribed.  She denies any GERD symptoms and feels her GERD is well controlled.   Prediabetes:  She is somewhat compliant with a low sugar/carbohydrate diet.  She is not exercising regularly.  CKD:  She does drink some water during the day, but could drink more.  She takes advil on occasion.    Tobacco abuse;  She is still smoking and she knows she needs to quit.  She wants to quit, but is afraid she is mentally addicted to smoking.  She does have cough, wheeze and some SOB related to smoking.    Review of Systems  Constitutional: Negative for chills and fever.  Respiratory: Positive for cough (from smoking), shortness of breath (with exertion) and wheezing (from smoking).   Cardiovascular: Negative for chest pain, palpitations and leg swelling.     Social History   Socioeconomic History  . Marital status: Single    Spouse name: Not on file  . Number of children: 2  . Years of education: Not on file  . Highest education level: Not on file  Occupational History  . Occupation: sedentary    Employer: [employer]  Social  Needs  . Financial resource strain: Not hard at all  . Food insecurity:    Worry: Never true    Inability: Never true  . Transportation needs:    Medical: No    Non-medical: No  Tobacco Use  . Smoking status: Current Every Day Smoker    Last attempt to quit: 03/07/2006    Years since quitting: 12.3  . Smokeless tobacco: Never Used  Substance and Sexual Activity  . Alcohol use: No    Alcohol/week: 0.0 standard drinks    Comment: Quit 2008  . Drug use: No  . Sexual activity: Not Currently  Lifestyle  . Physical activity:    Days per week: 0 days    Minutes per session: 0 min  . Stress: To some extent  Relationships  . Social connections:    Talks on phone: Once a week    Gets together: Once a week    Attends religious service: Not on file    Active member of club or organization: No    Attends meetings of clubs or organizations: Never    Relationship status: Not on file  Other Topics Concern  . Not on file  Social History Narrative  . Not on file     Observations/Objective: Appears well in NAD   Assessment and Plan:  See Problem List for Assessment and Plan of chronic medical problems.   Follow Up Instructions:  I discussed the assessment and treatment plan with the patient. The patient was provided an opportunity to ask questions and all were answered. The patient agreed with the plan and demonstrated an understanding of the instructions.   The patient was advised to call back or seek an in-person evaluation if the symptoms worsen or if the condition fails to improve as anticipated.  Fu in 6 months  [name], MD

## 2018-07-23 ENCOUNTER — Encounter: Payer: Self-pay | Admitting: Internal Medicine

## 2018-07-23 ENCOUNTER — Ambulatory Visit (INDEPENDENT_AMBULATORY_CARE_PROVIDER_SITE_OTHER): Payer: Medicare HMO | Admitting: Internal Medicine

## 2018-07-23 DIAGNOSIS — I10 Essential (primary) hypertension: Secondary | ICD-10-CM

## 2018-07-23 DIAGNOSIS — Z720 Tobacco use: Secondary | ICD-10-CM | POA: Diagnosis not present

## 2018-07-23 DIAGNOSIS — R7303 Prediabetes: Secondary | ICD-10-CM

## 2018-07-23 DIAGNOSIS — K219 Gastro-esophageal reflux disease without esophagitis: Secondary | ICD-10-CM

## 2018-07-23 DIAGNOSIS — N183 Chronic kidney disease, stage 3 unspecified: Secondary | ICD-10-CM

## 2018-07-23 MED ORDER — HYDROCHLOROTHIAZIDE 12.5 MG PO CAPS: 12.5000 mg | ORAL_CAPSULE | Freq: Every day | ORAL | 3 refills | Status: DC

## 2018-07-23 MED ORDER — AMLODIPINE BESYLATE 5 MG PO TABS: 5.0000 mg | ORAL_TABLET | Freq: Every day | ORAL | 1 refills | Status: DC

## 2018-07-23 NOTE — Assessment & Plan Note (Signed)
She probably does not drink enough water during the day and will make more of an effort to drink more water Takes Advil only on occasion-stressed to keep this an absolute minimum CMP She will work on trying to get off of the omeprazole

## 2018-07-23 NOTE — Assessment & Plan Note (Signed)
She is somewhat compliant Check a1c Low sugar / carb diet Stressed regular exercise

## 2018-07-23 NOTE — Assessment & Plan Note (Addendum)
BP Readings from Last 3 Encounters:  05/02/18 122/70  11/27/17 130/70  10/11/17 (!) 148/78    BP well controlled Current regimen effective and well tolerated Continue current medications at current doses cmp

## 2018-07-23 NOTE — Assessment & Plan Note (Signed)
Smoking cessation was discussed for more than 3 minutes.  The patient was counseled on the dangers of tobacco use, and was advised to quit.  Reviewed ways of quitting smoking including nicotine replacement, vapping/e-cigarettes, cold [place], weaning off cigarettes, and pharmacotherapy (wellbutrin and chantix).  She does want to quit, but is not 100% committed to quitting.  She is mostly concerned that she is addicted mentally and not quite ready to quit.  Advised that she call if she wants to try nicotine patches.  Advised that I would not feel comfortable prescribing Chantix unless her psychiatrist feels it is okay.  She is already on Wellbutrin so that is not an option for her.

## 2018-07-23 NOTE — Assessment & Plan Note (Signed)
GERD controlled - ? Need daily medication Try to wean off omeprazole if possible  - discussed possible long term side effects of being on medication

## 2018-07-24 ENCOUNTER — Telehealth: Payer: Self-pay

## 2018-07-24 DIAGNOSIS — F3160 Bipolar disorder, current episode mixed, unspecified: Secondary | ICD-10-CM

## 2018-07-24 DIAGNOSIS — F3289 Other specified depressive episodes: Secondary | ICD-10-CM

## 2018-07-24 NOTE — Telephone Encounter (Signed)
Copied from [place] [id number]. Topic: Referral - Request for Referral >> Jul 23, 2018  4:27 PM Virl Axe D wrote: Has patient seen PCP for this complaint? Yes *If NO, is insurance requiring patient see PCP for this issue before PCP can refer them? Referral for which specialty:  Preferred provider/office: [facility] / Attn: Ebony Hail / fax [phone] / Ref Chart number [id number] Reason for referral: Referral needed to schedule. Pt discussed with Dr. Quay Burow today.

## 2018-07-24 NOTE — Telephone Encounter (Signed)
ordered

## 2018-08-01 NOTE — Progress Notes (Signed)
Virtual Visit via Video Note  I connected with Tammy Stevens on 08/02/18 at 10:30 AM EDT by a video enabled telemedicine application and verified that I am speaking with the correct person using two identifiers.   I discussed the limitations of evaluation and management by telemedicine and the availability of in person appointments. The patient expressed understanding and agreed to proceed.  The patient is currently at home and I am in the office.    No referring provider.    History of Present Illness: She is here for an acute visit for cold symptoms.   Her symptoms started about two weeks ago.    She is experiencing sinus pain, nasal congestion, earache b/l, feels bad all the time and headaches.  There has been no improvement with otc meds.    No fever, sore throat or dizziness.   She has tried taking Tylenol severe sinus, Zyrtec, flanose and saline spray  GERD: At her last visit we talked about discontinuing the omeprazole and she did, but as a result has had terrible GERD since then.  She wonders what she should do now.   Review of Systems  Constitutional: Negative for fever.  HENT: Positive for congestion, ear pain and sinus pain. Negative for sore throat.        No PND  Respiratory: Negative for cough, shortness of breath and wheezing.   Neurological: Positive for headaches. Negative for dizziness.     Social History   Socioeconomic History  . Marital status: Single    Spouse name: Not on file  . Number of children: 2  . Years of education: Not on file  . Highest education level: Not on file  Occupational History  . Occupation: sedentary    Employer: [employer]  Social Needs  . Financial resource strain: Not hard at all  . Food insecurity:    Worry: Never true    Inability: Never true  . Transportation needs:    Medical: No    Non-medical: No  Tobacco Use  . Smoking status: Current Every Day Smoker    Last attempt to quit: 03/07/2006    Years since  quitting: 12.4  . Smokeless tobacco: Never Used  Substance and Sexual Activity  . Alcohol use: No    Alcohol/week: 0.0 standard drinks    Comment: Quit 2008  . Drug use: No  . Sexual activity: Not Currently  Lifestyle  . Physical activity:    Days per week: 0 days    Minutes per session: 0 min  . Stress: To some extent  Relationships  . Social connections:    Talks on phone: Once a week    Gets together: Once a week    Attends religious service: Not on file    Active member of club or organization: No    Attends meetings of clubs or organizations: Never    Relationship status: Not on file  Other Topics Concern  . Not on file  Social History Narrative  . Not on file     Observations/Objective: Appears well in NAD   Assessment and Plan:  See Problem List for Assessment and Plan of chronic medical problems.   Follow Up Instructions:    I discussed the assessment and treatment plan with the patient. The patient was provided an opportunity to ask questions and all were answered. The patient agreed with the plan and demonstrated an understanding of the instructions.   The patient was advised to call back or seek an  in-person evaluation if the symptoms worsen or if the condition fails to improve as anticipated.    [name], MD

## 2018-08-02 ENCOUNTER — Encounter: Payer: Self-pay | Admitting: Internal Medicine

## 2018-08-02 ENCOUNTER — Ambulatory Visit (INDEPENDENT_AMBULATORY_CARE_PROVIDER_SITE_OTHER): Payer: Medicare HMO | Admitting: Internal Medicine

## 2018-08-02 DIAGNOSIS — J329 Chronic sinusitis, unspecified: Secondary | ICD-10-CM | POA: Insufficient documentation

## 2018-08-02 DIAGNOSIS — K219 Gastro-esophageal reflux disease without esophagitis: Secondary | ICD-10-CM | POA: Diagnosis not present

## 2018-08-02 MED ORDER — DOXYCYCLINE HYCLATE 100 MG PO TABS: 100.0000 mg | ORAL_TABLET | Freq: Two times a day (BID) | ORAL | 0 refills | Status: DC

## 2018-08-02 NOTE — Assessment & Plan Note (Signed)
GERD uncontrolled since stopping omeprazole since her last visit Discussed that she can try Pepcid if she is able to find over-the-counter, but there is currently a shortage due to Zantac being recalled If she is unable to take Pepcid will need to restart omeprazole-once GERD controlled she can take this every other day to see if that still keeps it controlled-the lowest dose possible is better, but discussed the importance of keeping her GERD controlled Call with any questions

## 2018-08-02 NOTE — Assessment & Plan Note (Signed)
Likely bacterial  Start doxycycline otc cold medications, allergy medication Rest, fluid Call if no improvement

## 2018-08-10 DIAGNOSIS — F1021 Alcohol dependence, in remission: Secondary | ICD-10-CM | POA: Diagnosis not present

## 2018-08-10 DIAGNOSIS — F3181 Bipolar II disorder: Secondary | ICD-10-CM | POA: Diagnosis not present

## 2018-09-04 DIAGNOSIS — F3181 Bipolar II disorder: Secondary | ICD-10-CM | POA: Diagnosis not present

## 2018-09-04 DIAGNOSIS — F1021 Alcohol dependence, in remission: Secondary | ICD-10-CM | POA: Diagnosis not present

## 2018-09-28 DIAGNOSIS — F3181 Bipolar II disorder: Secondary | ICD-10-CM | POA: Diagnosis not present

## 2018-09-28 DIAGNOSIS — F1021 Alcohol dependence, in remission: Secondary | ICD-10-CM | POA: Diagnosis not present

## 2018-10-09 DIAGNOSIS — Z20828 Contact with and (suspected) exposure to other viral communicable diseases: Secondary | ICD-10-CM | POA: Diagnosis not present

## 2018-11-26 DIAGNOSIS — F1021 Alcohol dependence, in remission: Secondary | ICD-10-CM | POA: Diagnosis not present

## 2018-11-26 DIAGNOSIS — F3181 Bipolar II disorder: Secondary | ICD-10-CM | POA: Diagnosis not present

## 2018-12-14 ENCOUNTER — Telehealth: Payer: Self-pay

## 2018-12-14 DIAGNOSIS — F3289 Other specified depressive episodes: Secondary | ICD-10-CM

## 2018-12-14 DIAGNOSIS — F3160 Bipolar disorder, current episode mixed, unspecified: Secondary | ICD-10-CM

## 2018-12-14 NOTE — Telephone Encounter (Signed)
Copied from [place] [id number]. Topic: General - Other >> Dec 14, 2018 11:53 AM Rainey Pines A wrote: [facility] needs documentation showing referral to [facility] from Dr. Quay Burow. Patient would like a callback from nurse

## 2018-12-14 NOTE — Telephone Encounter (Signed)
Pt needs a referral to [facility]. Has an appointment on October 26th

## 2018-12-21 DIAGNOSIS — F1021 Alcohol dependence, in remission: Secondary | ICD-10-CM | POA: Diagnosis not present

## 2018-12-21 DIAGNOSIS — F3181 Bipolar II disorder: Secondary | ICD-10-CM | POA: Diagnosis not present

## 2018-12-31 DIAGNOSIS — F3181 Bipolar II disorder: Secondary | ICD-10-CM | POA: Diagnosis not present

## 2018-12-31 DIAGNOSIS — F1021 Alcohol dependence, in remission: Secondary | ICD-10-CM | POA: Diagnosis not present

## 2019-01-02 ENCOUNTER — Telehealth: Payer: Self-pay

## 2019-01-02 ENCOUNTER — Other Ambulatory Visit: Payer: Self-pay | Admitting: Internal Medicine

## 2019-01-02 DIAGNOSIS — Z1231 Encounter for screening mammogram for malignant neoplasm of breast: Secondary | ICD-10-CM

## 2019-01-02 NOTE — Telephone Encounter (Signed)
Copied from [place] [id number]. Topic: Quick Communication - See Telephone Encounter >> Jan 02, 2019 12:30 PM Loma Boston wrote: CRM for notification. See Telephone encounter for: 01/02/19. Pt needs referral [id number] to [facility] resent to [facility]. They have sent back to pt denied need referral from dr. The referral is listed, pt had the appt and it is showing as valid.

## 2019-01-08 NOTE — Telephone Encounter (Signed)
Referral being mailed to   Tammy Stevens, [place] is also resending claim  Pt is aware

## 2019-01-16 NOTE — Progress Notes (Signed)
Virtual Visit via Video Note  I connected with Tammy Stevens on 01/17/19 at 10:30 AM EST by a video enabled telemedicine application and verified that I am speaking with the correct person using two identifiers.   I discussed the limitations of evaluation and management by telemedicine and the availability of in person appointments. The patient expressed understanding and agreed to proceed.  Present for the visit:  Myself, Dr Billey Gosling, [name].  The patient is currently at home and I am at home.    No referring provider.    History of Present Illness: She is here for follow up of her chronic medical conditions.    She is exercising a little.    Hypertension: She is taking her medication daily. She is compliant with a low sodium diet.  She denies chest pain, palpitations, edema, shortness of breath and regular headaches. She does not monitor her blood pressure at home.    GERD:  She is takes omeprazole and Tums as needed  only.   She feels her GERD is well controlled.   Prediabetes:  She is compliant with a low sugar/carbohydrate diet.  She is exercising a little.  CKD:  She is drinking more water.    Tobacco abuse:  She is still smoking and has no desire to quit.    Review of Systems  Constitutional: Negative for chills and fever.  Respiratory: Positive for cough (smoking related). Negative for shortness of breath and wheezing.   Cardiovascular: Negative for chest pain, palpitations and leg swelling.  Neurological: Negative for dizziness and headaches.        Social History   Socioeconomic History  . Marital status: Single    Spouse name: Not on file  . Number of children: 2  . Years of education: Not on file  . Highest education level: Not on file  Occupational History  . Occupation: sedentary    Employer: [employer]  Social Needs  . Financial resource strain: Not hard at all  . Food insecurity    Worry: Never true    Inability: Never true  .  Transportation needs    Medical: No    Non-medical: No  Tobacco Use  . Smoking status: Current Every Day Smoker    Last attempt to quit: 03/07/2006    Years since quitting: 12.8  . Smokeless tobacco: Never Used  Substance and Sexual Activity  . Alcohol use: No    Alcohol/week: 0.0 standard drinks    Comment: Quit 2008  . Drug use: No  . Sexual activity: Not Currently  Lifestyle  . Physical activity    Days per week: 0 days    Minutes per session: 0 min  . Stress: To some extent  Relationships  . Social Herbalist on phone: Once a week    Gets together: Once a week    Attends religious service: Not on file    Active member of club or organization: No    Attends meetings of clubs or organizations: Never    Relationship status: Not on file  Other Topics Concern  . Not on file  Social History Narrative  . Not on file     Observations/Objective: Appears well in NAD Breathing normally Mood and affect normal Skin appears warm and dry  Assessment and Plan:  We will go to the lab for blood work-ordered  See Problem List for Assessment and Plan of chronic medical problems.   Follow Up Instructions:  I discussed the assessment and treatment plan with the patient. The patient was provided an opportunity to ask questions and all were answered. The patient agreed with the plan and demonstrated an understanding of the instructions.   The patient was advised to call back or seek an in-person evaluation if the symptoms worsen or if the condition fails to improve as anticipated.  FU in 6 months    [name], MD

## 2019-01-17 ENCOUNTER — Encounter: Payer: Self-pay | Admitting: Internal Medicine

## 2019-01-17 ENCOUNTER — Ambulatory Visit (INDEPENDENT_AMBULATORY_CARE_PROVIDER_SITE_OTHER): Payer: Medicare HMO | Admitting: Internal Medicine

## 2019-01-17 DIAGNOSIS — R7303 Prediabetes: Secondary | ICD-10-CM

## 2019-01-17 DIAGNOSIS — I10 Essential (primary) hypertension: Secondary | ICD-10-CM | POA: Diagnosis not present

## 2019-01-17 DIAGNOSIS — F3160 Bipolar disorder, current episode mixed, unspecified: Secondary | ICD-10-CM

## 2019-01-17 DIAGNOSIS — N1831 Chronic kidney disease, stage 3a: Secondary | ICD-10-CM | POA: Diagnosis not present

## 2019-01-17 DIAGNOSIS — K219 Gastro-esophageal reflux disease without esophagitis: Secondary | ICD-10-CM | POA: Diagnosis not present

## 2019-01-17 DIAGNOSIS — Z720 Tobacco use: Secondary | ICD-10-CM | POA: Diagnosis not present

## 2019-01-17 NOTE — Assessment & Plan Note (Signed)
Overall GERD controlled Taking omeprazole and Tums as needed Continue

## 2019-01-17 NOTE — Assessment & Plan Note (Signed)
Per psychiatry 

## 2019-01-17 NOTE — Assessment & Plan Note (Signed)
Drink enough water CMP

## 2019-01-17 NOTE — Assessment & Plan Note (Signed)
Continue current medications at current doses Unfortunately she does not monitor her blood pressure at home, but it has been controlled previously CMP, CBC, TSH

## 2019-01-17 NOTE — Assessment & Plan Note (Signed)
Encouraged low sugar/carbohydrate diet Encouraged regular exercise A1c

## 2019-01-17 NOTE — Assessment & Plan Note (Signed)
She is still smoking and has no desire to quit

## 2019-01-18 DIAGNOSIS — F3181 Bipolar II disorder: Secondary | ICD-10-CM | POA: Diagnosis not present

## 2019-01-20 ENCOUNTER — Other Ambulatory Visit: Payer: Self-pay | Admitting: Internal Medicine

## 2019-01-23 ENCOUNTER — Ambulatory Visit: Payer: Medicare HMO | Admitting: Internal Medicine

## 2019-02-22 ENCOUNTER — Ambulatory Visit: Payer: Medicare HMO

## 2019-03-11 NOTE — Progress Notes (Signed)
Virtual Visit via Video Note  I connected with Tammy Stevens on 03/12/19 at  2:30 PM EST by a video enabled telemedicine application and verified that I am speaking with the correct person using two identifiers.   I discussed the limitations of evaluation and management by telemedicine and the availability of in person appointments. The patient expressed understanding and agreed to proceed.  Present for the visit:  Myself, Dr Billey Gosling, [name].  The patient is currently at home and I am in the office.    No referring provider.    History of Present Illness: This visit is for an acute visit for cold symptoms.   Her symptoms started at least a couple of week sago.   She is experiencing nasal congestion on one prior to sleep. Pain and headaches on the same side.  She denies fever, ear pain and sore throat.  She denies any coughing, wheezing or shortness of breath.  Symptoms consistent with prior sinus infections.  She has tried taking saline spray, otc cold meds without improvement.   No risk of COVID.  Review of Systems  Constitutional: Negative for chills and fever.  HENT: Positive for congestion (one side) and sinus pain (one side). Negative for ear pain and sore throat.   Respiratory: Negative for cough, shortness of breath and wheezing.   Neurological: Positive for headaches.     Social History   Socioeconomic History  . Marital status: Single    Spouse name: Not on file  . Number of children: 2  . Years of education: Not on file  . Highest education level: Not on file  Occupational History  . Occupation: sedentary    Employer: [employer]  Tobacco Use  . Smoking status: Current Every Day Smoker    Last attempt to quit: 03/07/2006    Years since quitting: 13.0  . Smokeless tobacco: Never Used  Substance and Sexual Activity  . Alcohol use: No    Alcohol/week: 0.0 standard drinks    Comment: Quit 2008  . Drug use: No  . Sexual activity: Not Currently  Other  Topics Concern  . Not on file  Social History Narrative  . Not on file   Social Determinants of Health   Financial Resource Strain:   . Difficulty of Paying Living Expenses: Not on file  Food Insecurity:   . Worried About Charity fundraiser in the Last Year: Not on file  . Ran Out of Food in the Last Year: Not on file  Transportation Needs:   . Lack of Transportation (Medical): Not on file  . Lack of Transportation (Non-Medical): Not on file  Physical Activity:   . Days of Exercise per Week: Not on file  . Minutes of Exercise per Session: Not on file  Stress:   . Feeling of Stress : Not on file  Social Connections:   . Frequency of Communication with Friends and Family: Not on file  . Frequency of Social Gatherings with Friends and Family: Not on file  . Attends Religious Services: Not on file  . Active Member of Clubs or Organizations: Not on file  . Attends Archivist Meetings: Not on file  . Marital Status: Not on file     Observations/Objective: Appears well in NAD Breathing normally Skin appears warm and dry  Assessment and Plan:  See Problem List for Assessment and Plan of chronic medical problems.   Follow Up Instructions:    I discussed the assessment and  treatment plan with the patient. The patient was provided an opportunity to ask questions and all were answered. The patient agreed with the plan and demonstrated an understanding of the instructions.   The patient was advised to call back or seek an in-person evaluation if the symptoms worsen or if the condition fails to improve as anticipated.    [name], MD

## 2019-03-12 ENCOUNTER — Encounter: Payer: Self-pay | Admitting: Internal Medicine

## 2019-03-12 ENCOUNTER — Ambulatory Visit (INDEPENDENT_AMBULATORY_CARE_PROVIDER_SITE_OTHER): Payer: Medicare HMO | Admitting: Internal Medicine

## 2019-03-12 DIAGNOSIS — J329 Chronic sinusitis, unspecified: Secondary | ICD-10-CM | POA: Insufficient documentation

## 2019-03-12 DIAGNOSIS — J019 Acute sinusitis, unspecified: Secondary | ICD-10-CM

## 2019-03-12 MED ORDER — DOXYCYCLINE HYCLATE 100 MG PO TABS: 100.0000 mg | ORAL_TABLET | Freq: Two times a day (BID) | ORAL | 0 refills | Status: DC

## 2019-03-12 NOTE — Assessment & Plan Note (Addendum)
Ongoing for about 2 weeks or so, no improvement with conservative measures Likely bacterial  Start doxycycline otc cold medications, saline nasal spray Rest, fluid Call if no improvement

## 2019-04-02 ENCOUNTER — Ambulatory Visit: Payer: Medicare HMO

## 2019-04-04 DIAGNOSIS — F1021 Alcohol dependence, in remission: Secondary | ICD-10-CM | POA: Diagnosis not present

## 2019-04-04 DIAGNOSIS — F3181 Bipolar II disorder: Secondary | ICD-10-CM | POA: Diagnosis not present

## 2019-04-11 ENCOUNTER — Ambulatory Visit: Payer: Medicare HMO | Attending: Internal Medicine

## 2019-04-11 DIAGNOSIS — Z23 Encounter for immunization: Secondary | ICD-10-CM | POA: Insufficient documentation

## 2019-04-11 NOTE — Progress Notes (Signed)
   Covid-19 Vaccination Clinic  Name:  Tammy Stevens    MRN: [id number] DOB: 1945-07-18  04/11/2019  [name] was observed post Covid-19 immunization for 15 minutes without incidence. She was provided with Vaccine Information Sheet and instruction to access the V-Safe system.   [name] was instructed to call 911 with any severe reactions post vaccine: Marland Kitchen Difficulty breathing  . Swelling of your face and throat  . A fast heartbeat  . A bad rash all over your body  . Dizziness and weakness    Immunizations Administered    Name Date Dose VIS Date Route   Pfizer COVID-19 Vaccine 04/11/2019  2:30 PM 0.3 mL 02/15/2019 Intramuscular   Manufacturer: [place]   Lot: [id number]   [place]: [id number]

## 2019-04-23 ENCOUNTER — Ambulatory Visit: Payer: Medicare HMO

## 2019-05-06 ENCOUNTER — Ambulatory Visit: Payer: Medicare HMO | Attending: Internal Medicine

## 2019-05-06 DIAGNOSIS — Z23 Encounter for immunization: Secondary | ICD-10-CM | POA: Insufficient documentation

## 2019-05-06 NOTE — Progress Notes (Signed)
   Covid-19 Vaccination Clinic  Name:  Tammy Stevens    MRN: [id number] DOB: 1945/08/27  05/06/2019  [name] was observed post Covid-19 immunization for 15 minutes without incidence. She was provided with Vaccine Information Sheet and instruction to access the V-Safe system.   [name] was instructed to call 911 with any severe reactions post vaccine: Marland Kitchen Difficulty breathing  . Swelling of your face and throat  . A fast heartbeat  . A bad rash all over your body  . Dizziness and weakness    Immunizations Administered    Name Date Dose VIS Date Route   Pfizer COVID-19 Vaccine 05/06/2019  4:17 PM 0.3 mL 02/15/2019 Intramuscular   Manufacturer: [place]   Lot: [id number]   [place]: [id number]

## 2019-05-13 ENCOUNTER — Telehealth: Payer: Self-pay | Admitting: Internal Medicine

## 2019-05-13 DIAGNOSIS — F3181 Bipolar II disorder: Secondary | ICD-10-CM | POA: Diagnosis not present

## 2019-05-13 NOTE — Progress Notes (Signed)
  Chronic Care Management   Outreach Note  05/13/2019 Name: Tammy Stevens MRN: [id number] DOB: 1945-03-21  Referred by: [name], MD Reason for referral : No chief complaint on file.   An unsuccessful telephone outreach was attempted today. The patient was referred to the pharmacist for assistance with care management and care coordination.   Follow Up Plan:   Raynicia Dukes [employer] Scheduler

## 2019-05-22 ENCOUNTER — Telehealth: Payer: Self-pay | Admitting: Internal Medicine

## 2019-05-22 DIAGNOSIS — N1831 Chronic kidney disease, stage 3a: Secondary | ICD-10-CM

## 2019-05-22 NOTE — Progress Notes (Signed)
  Chronic Care Management   Note  05/22/2019 Name: Tammy Stevens MRN: [id number] DOB: [birth year]  Tammy Stevens is a 75 y.o. year old female who is a primary care patient of Burns, Claudina Lick, MD. I reached out to [name] by phone today in response to a referral sent by Tammy Stevens's PCP, [name], MD.   Tammy Stevens was given information about Chronic Care Management services today including:  1. CCM service includes personalized support from designated clinical staff supervised by her physician, including individualized plan of care and coordination with other care providers 2. 24/7 contact phone numbers for assistance for urgent and routine care needs. 3. Service will only be billed when office clinical staff spend 20 minutes or more in a month to coordinate care. 4. Only one practitioner may furnish and bill the service in a calendar month. 5. The patient may stop CCM services at any time (effective at the end of the month) by phone call to the office staff.   Patient agreed to services and verbal consent obtained.   Follow up plan:  Tammy Stevens [employer] Scheduler

## 2019-05-31 ENCOUNTER — Other Ambulatory Visit: Payer: Self-pay

## 2019-05-31 ENCOUNTER — Other Ambulatory Visit (INDEPENDENT_AMBULATORY_CARE_PROVIDER_SITE_OTHER): Payer: Medicare HMO

## 2019-05-31 ENCOUNTER — Telehealth: Payer: Self-pay

## 2019-05-31 DIAGNOSIS — R7303 Prediabetes: Secondary | ICD-10-CM

## 2019-05-31 DIAGNOSIS — I10 Essential (primary) hypertension: Secondary | ICD-10-CM

## 2019-05-31 DIAGNOSIS — N1831 Chronic kidney disease, stage 3a: Secondary | ICD-10-CM

## 2019-05-31 DIAGNOSIS — Z8639 Personal history of other endocrine, nutritional and metabolic disease: Secondary | ICD-10-CM

## 2019-05-31 LAB — COMPREHENSIVE METABOLIC PANEL
ALT: 29 U/L (ref 0–35)
AST: 25 U/L (ref 0–37)
Albumin: 4.3 g/dL (ref 3.5–5.2)
Alkaline Phosphatase: 88 U/L (ref 39–117)
BUN: 13 mg/dL (ref 6–23)
CO2: 27 mEq/L (ref 19–32)
Calcium: 9.9 mg/dL (ref 8.4–10.5)
Chloride: 100 mEq/L (ref 96–112)
Creatinine, Ser: 1.05 mg/dL (ref 0.40–1.20)
GFR: 51.41 mL/min — ABNORMAL LOW (ref >60.00)
Glucose, Bld: 112 mg/dL — ABNORMAL HIGH (ref 70–99)
Potassium: 3.7 mEq/L (ref 3.5–5.1)
Sodium: 136 mEq/L (ref 135–145)
Total Bilirubin: 0.4 mg/dL (ref 0.2–1.2)
Total Protein: 6.8 g/dL (ref 6.0–8.3)

## 2019-05-31 LAB — LIPID PANEL
Cholesterol: 259 mg/dL — ABNORMAL HIGH (ref 0–200)
HDL: 51 mg/dL (ref >39.00)
NonHDL: 207.51
Total CHOL/HDL Ratio: 5
Triglycerides: 237 mg/dL — ABNORMAL HIGH (ref 0.0–149.0)
VLDL: 47.4 mg/dL — ABNORMAL HIGH (ref 0.0–40.0)

## 2019-05-31 LAB — TSH: TSH: 1.01 u[IU]/mL (ref 0.35–4.50)

## 2019-05-31 LAB — HEMOGLOBIN A1C: Hgb A1c MFr Bld: 5.9 % (ref 4.6–6.5)

## 2019-05-31 LAB — CBC WITH DIFFERENTIAL/PLATELET
Basophils Absolute: 0.1 10*3/uL (ref 0.0–0.1)
Basophils Relative: 0.7 % (ref 0.0–3.0)
Eosinophils Absolute: 0.2 10*3/uL (ref 0.0–0.7)
Eosinophils Relative: 1.6 % (ref 0.0–5.0)
HCT: 39.6 % (ref 36.0–46.0)
Hemoglobin: 13.2 g/dL (ref 12.0–15.0)
Lymphocytes Relative: 41 % (ref 12.0–46.0)
Lymphs Abs: 4.1 10*3/uL — ABNORMAL HIGH (ref 0.7–4.0)
MCHC: 33.4 g/dL (ref 30.0–36.0)
MCV: 88.2 fl (ref 78.0–100.0)
Monocytes Absolute: 0.8 10*3/uL (ref 0.1–1.0)
Monocytes Relative: 8.4 % (ref 3.0–12.0)
Neutro Abs: 4.8 10*3/uL (ref 1.4–7.7)
Neutrophils Relative %: 48.3 % (ref 43.0–77.0)
Platelets: 415 10*3/uL — ABNORMAL HIGH (ref 150.0–400.0)
RBC: 4.49 Mil/uL (ref 3.87–5.11)
RDW: 13.5 % (ref 11.5–15.5)
WBC: 10 10*3/uL (ref 4.0–10.5)

## 2019-05-31 LAB — LDL CHOLESTEROL, DIRECT: Direct LDL: 170 mg/dL

## 2019-05-31 NOTE — Telephone Encounter (Signed)
Would it just be put in under iron?

## 2019-05-31 NOTE — Telephone Encounter (Signed)
Patient calling and states that she had labs today and wanted to get an Iron level drawn but was not sure insurance would cover it. States that the lab drew an extra tube and she checked with her insurance and they will cover the iron check. Please advise.

## 2019-05-31 NOTE — Telephone Encounter (Signed)
Yes - iron panel  - if covered or if she wants to pay for it

## 2019-06-02 ENCOUNTER — Encounter: Payer: Self-pay | Admitting: Internal Medicine

## 2019-06-03 ENCOUNTER — Encounter: Payer: Self-pay | Admitting: Internal Medicine

## 2019-06-03 DIAGNOSIS — F3181 Bipolar II disorder: Secondary | ICD-10-CM | POA: Diagnosis not present

## 2019-06-03 DIAGNOSIS — E7849 Other hyperlipidemia: Secondary | ICD-10-CM

## 2019-06-03 DIAGNOSIS — R7303 Prediabetes: Secondary | ICD-10-CM

## 2019-06-10 ENCOUNTER — Other Ambulatory Visit: Payer: Self-pay

## 2019-06-10 ENCOUNTER — Ambulatory Visit: Payer: Medicare HMO | Admitting: Pharmacist

## 2019-06-10 DIAGNOSIS — K219 Gastro-esophageal reflux disease without esophagitis: Secondary | ICD-10-CM

## 2019-06-10 DIAGNOSIS — I10 Essential (primary) hypertension: Secondary | ICD-10-CM

## 2019-06-10 DIAGNOSIS — E7849 Other hyperlipidemia: Secondary | ICD-10-CM

## 2019-06-10 DIAGNOSIS — F319 Bipolar disorder, unspecified: Secondary | ICD-10-CM

## 2019-06-10 NOTE — Progress Notes (Signed)
Subjective:    Patient ID: Tammy Stevens, female    DOB: February 27, 1946, 74 y.o.   MRN: [id number]  HPI The patient is here for an acute visit.   Lesion on right leg and left buttock: She noticed this about 4 weeks ago and is concerned it may be infected.  She first noticed them because they started to itch and she did scratch them.  They were tender if she moves or put any pressure on them.  She was unsure if it was a pressure sore or what.  She did apply alcohol and hydrogen peroxide and thinks that helped dry them up.  There was some clear discharge intermittently.  At this point they still itch a little, but do not hurt unless she puts pressure on it or moves in a certain way.  She does have some shortness of breath with exertion and believes it is from smoking.  She smokes 6 cigarettes a day.  She does want to quit.  She did discuss with her pharmacist nicotine patches and wonders if she should try them.  She knows that a lot of her smoking is habitual and emotionally related.  She wonders if the patches would help that as well.  She has year-round allergies and wonders what she can take.  She has bipolar disorder and follows with psychiatry.  She does have bouts of depression.  The pharmacist discussed possibly starting Prozac with her and she wanted to see if that was a possibility.  She is willing to try the cholesterol medication.  She discussed this with the pharmacist as well.     Lightheadedness last week:  She stood up and went to close the door and had severe lightheadedness.  She laid down and still felt lightheaded for a long time.  She did not feel it the next day.  The next night she had the same thing after standing up after sitting for a while.  She did have some spinning sensation.  Her whole head felt messed up and disorientated.  Her BP at the time was good. She denied headache, chest pain, palps.  She was eating and drinking normally.  She denies other symptoms.  She has  not had any similar symptoms since then.   The 10-year ASCVD risk score Mikey Bussing DC Brooke Bonito., et al., 2013) is: 33%   Values used to calculate the score:     Age: 50 years     Sex: Female     Is Non-Hispanic African American: No     Diabetic: No     Tobacco smoker: Yes     Systolic Blood Pressure: 0000000 mmHg     Is BP treated: Yes     HDL Cholesterol: 51 mg/dL     Total Cholesterol: 259 mg/dL   Medications and allergies reviewed with patient and updated if appropriate.  Patient Active Problem List   Diagnosis Date Noted  . Allergic rhinitis 06/11/2019  . Sinus infection 03/12/2019  . Tobacco abuse 07/23/2018  . CKD (chronic kidney disease) stage 3, GFR 30-59 ml/min 07/22/2018  . Prediabetes 10/11/2017  . Tingling 10/11/2017  . Palpitations 07/28/2017  . DOE (dyspnea on exertion) 07/28/2017  . Diastolic dysfunction [id number]  . Memory difficulties 09/23/2015  . Skin cancer 09/23/2015  . Bipolar disorder ([place]) 02/09/2015  . Headache around the eyes 04/17/2014  . Cystocele 06/24/2013  . FATTY LIVER DISEASE 02/18/2010  . CHOLELITHIASIS, ASYMPTOMATIC 02/18/2010  . 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  IRRITABLE BOWEL SYNDROME 02/16/2010  . GERD 02/11/2010  . Hyperlipidemia 12/22/2009  . OSA (obstructive sleep apnea) 12/22/2009  . Depression 11/24/2009  . Essential hypertension 11/24/2009    Current Outpatient Medications on File Prior to Visit  Medication Sig Dispense Refill  . amLODipine (NORVASC) 5 MG tablet TAKE 1 TABLET BY MOUTH EVERY DAY 90 tablet 1  . aspirin EC 81 MG tablet Take 81 mg by mouth daily.    Marland Kitchen buPROPion (WELLBUTRIN XL) 300 MG 24 hr tablet Take 1 tablet by mouth daily.     . diazepam (VALIUM) 5 MG tablet Take 1 tablet (5 mg total) by mouth every 12 (twelve) hours as needed for anxiety. 20 tablet 0  . gabapentin (NEURONTIN) 300 MG capsule Take 300 mg by mouth 2 (two) times daily.   1  . hydrochlorothiazide (MICROZIDE) 12.5 MG capsule Take 1 capsule (12.5 mg  total) by mouth daily. 90 capsule 3  . lamoTRIgine (LAMICTAL) 200 MG tablet Take 200 mg by mouth 2 (two) times daily.   1  . Multiple Vitamin (MULTIVITAMIN) tablet Take 1 tablet by mouth daily.    Marland Kitchen omeprazole (PRILOSEC) 20 MG capsule TAKE 1 CAPSULE BY MOUTH EVERY DAY 90 capsule 0  . buPROPion (WELLBUTRIN XL) 150 MG 24 hr tablet Take 1 tablet by mouth daily. In afternoon, in addition to 300 mg     No current facility-administered medications on file prior to visit.    Past Medical History:  Diagnosis Date  . Allergic rhinitis   . Allergy   . Bipolar disorder ([place])   . Blood transfusion without reported diagnosis    as child  . Cholelithiasis    See U/S 12/11  . Depression    DX with Bipolar in the past   . Depression   . Diverticulosis   . Dyspnea    Spirometry 09/26/2008 FEV1 1.85 (122%), FVC 2.57(122%),  FEV1% 72  . Elevated liver enzymes   . Fatty liver    See U/S 12/11  . GERD (gastroesophageal reflux disease)   . History of alcohol abuse    Quit 2008  . History of drug abuse ([place])    Quit 2008   . Hyperlipemia   . Hypertension   . Peripheral neuropathy   . Sleep apnea    on CPAP    Past Surgical History:  Procedure Laterality Date  . BREAST LUMPECTOMY    . BTL    . COLONOSCOPY    . SKIN GRAFT     after [place] Ol[place] as a child   . TUBAL LIGATION      Social History   Socioeconomic History  . Marital status: Divorced    Spouse name: Not on file  . Number of children: 2  . Years of education: Not on file  . Highest education level: Not on file  Occupational History  . Occupation: sedentary    Employer: [employer]  Tobacco Use  . Smoking status: Current Every Day Smoker    Last attempt to quit: 03/07/2006    Years since quitting: 13.2  . Smokeless tobacco: Never Used  Substance and Sexual Activity  . Alcohol use: No    Alcohol/week: 0.0 standard drinks    Comment: Quit 2008  . Drug use: No  . Sexual activity: Not Currently  Other Topics Concern  .  Not on file  Social History Narrative  . Not on file   Social Determinants of Health   Financial Resource Strain:   . Difficulty  of Paying Living Expenses:   Food Insecurity:   . Worried About Charity fundraiser in the Last Year:   . Arboriculturist in the Last Year:   Transportation Needs:   . Film/video editor (Medical):   Marland Kitchen Lack of Transportation (Non-Medical):   Physical Activity:   . Days of Exercise per Week:   . Minutes of Exercise per Session:   Stress:   . Feeling of Stress :   Social Connections:   . Frequency of Communication with Friends and Family:   . Frequency of Social Gatherings with Friends and Family:   . Attends Religious Services:   . Active Member of Clubs or Organizations:   . Attends Archivist Meetings:   Marland Kitchen Marital Status:     Family History  Problem Relation Age of Onset  . Diabetes Other        GM? (not specific)  . Heart attack Other        GF? (not specific)  . Asthma Son   . Asthma Father   . Alcohol abuse Father   . Suicidality Father   . Depression Father   . Allergies Sister   . Colon cancer Neg Hx   . Esophageal cancer Neg Hx   . Rectal cancer Neg Hx   . Stomach cancer Neg Hx     Review of Systems  Constitutional: Negative for chills and fever.  Respiratory: Positive for shortness of breath (with exertion - smoking) and wheezing (occ with exertion). Negative for cough.   Cardiovascular: Negative for chest pain, palpitations and leg swelling.  Skin: Positive for wound.  Neurological: Positive for dizziness and light-headedness. Negative for headaches.       Objective:   Vitals:   06/11/19 1406  BP: (!) 152/72  Pulse: 73  Resp: 16  Temp: 99.3 F (37.4 C)  SpO2: 99%   BP Readings from Last 3 Encounters:  06/11/19 (!) 152/72  05/02/18 122/70  11/27/17 130/70   Wt Readings from Last 3 Encounters:  06/11/19 152 lb (68.9 kg)  05/02/18 149 lb 12.8 oz (67.9 kg)  11/27/17 153 lb 12.8 oz (69.8 kg)    Body mass index is 24.53 kg/m.   Physical Exam Constitutional:      General: She is not in acute distress.    Appearance: Normal appearance. She is not ill-appearing.  HENT:     Head: Normocephalic and atraumatic.  Neck:     Vascular: No carotid bruit.  Cardiovascular:     Rate and Rhythm: Normal rate and regular rhythm.     Heart sounds: No murmur.  Pulmonary:     Effort: Pulmonary effort is normal. No respiratory distress.     Breath sounds: Normal breath sounds. No wheezing or rales.  Musculoskeletal:     Cervical back: No tenderness.     Right lower leg: No edema.     Left lower leg: No edema.  Lymphadenopathy:     Cervical: No cervical adenopathy.  Skin:    General: Skin is warm and dry.     Findings: Lesion (Right lateral leg and left buttock-circular or oval lesions with well-defined borders without surrounding erythema, center is dry and uneven) present.  Neurological:     Mental Status: She is alert.  Psychiatric:        Mood and Affect: Mood normal.            Assessment & Plan:    See Problem List for Assessment  and Plan of chronic medical problems.    This visit occurred during the SARS-CoV-2 public health emergency.  Safety protocols were in place, including screening questions prior to the visit, additional usage of staff PPE, and extensive cleaning of exam room while observing appropriate contact time as indicated for disinfecting solutions.

## 2019-06-10 NOTE — Chronic Care Management (AMB) (Signed)
Chronic Care Management Pharmacy  Name: Tammy Stevens  MRN: [id number] DOB: February 24, 1946   Chief Complaint/ HPI  Tammy Stevens,  74 y.o. , female presents for their Initial CCM visit with the clinical pharmacist via telephone due to COVID-19 Pandemic.  PCP : [name], MD  Their chronic conditions include: HTN, HLD, GERD, IBS, CKD3, Bipolar/depression, prediabetes  Office Visits: 06/02/19 patient message re: blood work: LDL 170, pt told to consider cholesterol-lowering med. Referred to nutritionist as well.  03/12/19 Dr Quay Burow VV: c/o cold sx. Rx'd doxycycline, rec'd OTC saline nasal spray.  01/17/19 Dr Quay Burow VV: still smoking, no desire to quit. No med changes.  Consult Visit: 04/04/19 Dr Dorethea Clan (psych): via claims.  Medications: Outpatient Encounter Medications as of 06/10/2019  Medication Sig  . amLODipine (NORVASC) 5 MG tablet TAKE 1 TABLET BY MOUTH EVERY DAY  . aspirin EC 81 MG tablet Take 81 mg by mouth daily.  Marland Kitchen buPROPion (WELLBUTRIN XL) 300 MG 24 hr tablet Take 1 tablet by mouth daily.   Marland Kitchen gabapentin (NEURONTIN) 300 MG capsule Take 300 mg by mouth 2 (two) times daily.   . hydrochlorothiazide (MICROZIDE) 12.5 MG capsule Take 1 capsule (12.5 mg total) by mouth daily.  Marland Kitchen lamoTRIgine (LAMICTAL) 200 MG tablet Take 200 mg by mouth 2 (two) times daily.   . Multiple Vitamin (MULTIVITAMIN) tablet Take 1 tablet by mouth daily.  Marland Kitchen omeprazole (PRILOSEC) 20 MG capsule TAKE 1 CAPSULE BY MOUTH EVERY DAY  . diazepam (VALIUM) 5 MG tablet Take 1 tablet (5 mg total) by mouth every 12 (twelve) hours as needed for anxiety. (Patient not taking: Reported on 06/10/2019)  . mirtazapine (REMERON) 15 MG tablet 15 mg.    No facility-administered encounter medications on file as of 06/10/2019.     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 Cholesterol: LDL < 100       CARE PLAN ENTRY (see longtitudinal plan of care for additional care plan information)  Current Barriers:  . Uncontrolled hyperlipidemia, complicated by prolonged LDL > 130 . Current antihyperlipidemic regimen: no meds . Previous antihyperlipidemic medications tried: none . Most recent lipid panel:     Component Value Date/Time   CHOL 259 (H) 05/31/2019 1441   TRIG 237.0 (H) 05/31/2019 1441   HDL 51.00 05/31/2019 1441   CHOLHDL 5 05/31/2019 1441   VLDL 47.4 (H) 05/31/2019 1441   LDLCALC 171 (H) 02/09/2015 1616   LDLDIRECT 170.0 05/31/2019 1441   . ASCVD risk enhancing conditions: age >89, HTN,  current smoker . 10-year ASCVD risk score: 22.7%  Pharmacist Clinical Goal(s):  Marland Kitchen Over the next 30 days, patient will work with PharmD and providers towards optimized antihyperlipidemic therapy . Start pharmacologic therapy . Ensure safety, efficacy, and affordability of medications  Interventions: . Comprehensive medication review performed; medication list updated in electronic medical record.  . Recommended to start rosuvastatin 10 mg daily . Discussed diet changes to reduce cholesterol (see attached)  Patient Self Care Activities:  . Patient will focus on medication adherence  Initial goal documentation     . Mood improvement       CARE PLAN ENTRY  Current Barriers:  .  Chronic Disease Management support, education, and care coordination needs related to  Bipolar/depression  Pharmacist Clinical Goal(s):  Marland Kitchen Over the next 90 days, patient will work with pharmacist and providers to improve depression symptoms  Interventions: . Comprehensive medication review performed. . Discussed that fluoxetine (Prozac) has worked well in the past, recommended to discuss with psychiatrist o Fluoxetine can be taken with lamotrigine, bupropion,  and gabapentin  Patient Self Care Activities:  . Patient verbalizes understanding of plan to discuss fluoxetine with psychiatrist  Initial goal documentation    . Smoking Cessation       CARE PLAN ENTRY (see longtitudinal plan of care for additional care plan information)  Current Barriers:  . Currently smoking 1/2 ppd . Has not tried to quit before . Denies smoking within 30 minutes of waking up . Reports triggers to smoke include: "emotional smoker" - loneliness . Reports motivation to quit smoking includes: health  Pharmacist Clinical Goal(s):  Marland Kitchen Over the next 90 days, patient will work with PharmD and provider towards tobacco cessation  Interventions: . Comprehensive medication review performed, medication list in electronic medical record updated . Discussed various methods to quit smoking, including pharmacological methods and nicotine replacement  Patient Self Care Activities:  . Patient is not yet ready to quit, but will contact provider when she is  Initial goal documentation        Hypertension   Office blood pressures are  BP Readings from Last 3 Encounters:  05/02/18 122/70  11/27/17 130/70  10/11/17 (!) 148/78    Patient has failed these meds in the past: n/a Patient is currently controlled on the following medications: amlodipine 5 mg daily, HCTZ 12.5 mg daily  Patient checks BP at home weekly  Patient home BP readings are ranging: 120s-130/70s  We discussed diet and exercise extensively, pt is well controlled on current regimen, denies side effects.  Plan  Continue current medications and control with diet and exercise    Hyperlipidemia   Lipid Panel     Component Value Date/Time   CHOL 259 (H) 05/31/2019 1441   TRIG 237.0 (H) 05/31/2019 1441   HDL 51.00 05/31/2019 1441   CHOLHDL 5 05/31/2019 1441   VLDL 47.4 (H) 05/31/2019 1441   LDLCALC 171 (H) 02/09/2015 1616   LDLDIRECT 170.0 05/31/2019 1441    ASCVD 10-year risk:  22.7%  Patient has failed these meds in past: rosuvastatin (2016) - never started Patient is currently uncontrolled on the following medications: aspirin 81 mg daily  We discussed:  diet and exercise extensively - pt does not "eat right", she doesn't have much of an appetite with depression, so when she does eat it is sweets, carbs. Recently made effort to change diet to fruits, bran, salmon, tuna, sardines.    Discussed benefits of statins at length, answered patient's questions about side effects, pt agreed to start rosuvastatin.  Plan  Continue control with diet and exercise Recommended to start rosuvastatin 10 mg daily  Bipolar/Depression   Patient has failed these meds in past: mirtazapine (recently tapered) Patient is currently uncontrolled on the following medications: lamotrigine 200 mg BID, bupropion 300 mg daily, diazepam 5 mg BID prn, gabapentin 300 mg BID   We discussed:  Pt is feeling very depressed lately, medicines were recently changed by psychiatrist to combat increased fatigue - mirtazapine was stopped, and extra 150 mg dose of bupropion was added at 2pm. However pt had 2 dizzy spells soon after med changes that led to near-panic, so she went  back to taking the meds as above. She has not had any more dizzy spells, but she is still very depressed. Pt reports she took fluoxetine many years ago and it worked well for her.  Plan  Continue current medications  Recommended to discuss restarting fluoxetine with psychiatrist  GERD   Patient has failed these meds in past: n/a Patient is currently controlled on the following medications: omeprazole 20 mg daily  We discussed:  Pt doing well, denies issues.  Plan  Continue current medications    Health Maintenance   Patient is currently controlled on the following medications: multivitamin  We discussed: Patient is satisfied with current OTC regimen and denies issues  Plan  Continue current medications     Tobacco use   Tobacco Status:  Social History   Tobacco Use  Smoking Status Current Every Day Smoker  . Last attempt to quit: 03/07/2006  . 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 Discussed physical vs psychological dependence, benefits of medications and NRT to help with smoking cessation. Pt is not ready to quit today but will think about it.  Plan  Pt to call if she wants help quitting   Medication Management   Pt uses [facility] for all medications Does not use pill box Pt endorses good compliance  We discussed: Pt may benefit from [employer], however not ready to discuss today, will reassess at f/u.  Plan  Continue current medication management strategy     Follow up: 1 month phone visit  Charlene Brooke, PharmD Clinical Pharmacist [facility] at [facility] [phone]

## 2019-06-10 NOTE — Patient Instructions (Addendum)
Visit Information  Thank you for meeting with me to discuss your medications! I look forward to working with you to achieve your health care goals. Below is a summary of what we talked about during the visit:  Goals Addressed            This Visit's Progress   . Cholesterol: LDL < 100       CARE PLAN ENTRY (see longtitudinal plan of care for additional care plan information)  Current Barriers:  . Uncontrolled hyperlipidemia, complicated by prolonged LDL > 130 . Current antihyperlipidemic regimen: no meds . Previous antihyperlipidemic medications tried: none . Most recent lipid panel:     Component Value Date/Time   CHOL 259 (H) 05/31/2019 1441   TRIG 237.0 (H) 05/31/2019 1441   HDL 51.00 05/31/2019 1441   CHOLHDL 5 05/31/2019 1441   VLDL 47.4 (H) 05/31/2019 1441   LDLCALC 171 (H) 02/09/2015 1616   LDLDIRECT 170.0 05/31/2019 1441   . ASCVD risk enhancing conditions: age >30, HTN,  current smoker . 10-year ASCVD risk score: 22.7%  Pharmacist Clinical Goal(s):  Marland Kitchen Over the next 30 days, patient will work with PharmD and providers towards optimized antihyperlipidemic therapy . Start pharmacologic therapy . Ensure safety, efficacy, and affordability of medications  Interventions: . Comprehensive medication review performed; medication list updated in electronic medical record.  . Recommended to start rosuvastatin 10 mg daily . Discussed diet changes to reduce cholesterol (see attached)  Patient Self Care Activities:  . Patient will focus on medication adherence  Initial goal documentation     . Mood improvement       CARE PLAN ENTRY  Current Barriers:  . Chronic Disease Management support, education, and care coordination needs related to  Bipolar/depression  Pharmacist Clinical Goal(s):  Marland Kitchen Over the next 90 days, patient will work with pharmacist and providers to improve depression symptoms  Interventions: . Comprehensive medication review performed. . Discussed  that fluoxetine (Prozac) has worked well in the past, recommended to discuss with psychiatrist o Fluoxetine can be taken with lamotrigine, bupropion, and gabapentin  Patient Self Care Activities:  . Patient verbalizes understanding of plan to discuss fluoxetine with psychiatrist  Initial goal documentation    . Smoking Cessation       CARE PLAN ENTRY (see longtitudinal plan of care for additional care plan information)  Current Barriers:  . Currently smoking 1/2 ppd . Has not tried to quit before . Denies smoking within 30 minutes of waking up . Reports triggers to smoke include: "emotional smoker" - loneliness . Reports motivation to quit smoking includes: health  Pharmacist Clinical Goal(s):  Marland Kitchen Over the next 90 days, patient will work with PharmD and provider towards tobacco cessation  Interventions: . Comprehensive medication review performed, medication list in electronic medical record updated . Discussed various methods to quit smoking, including pharmacological methods and nicotine replacement  Patient Self Care Activities:  . Patient is not yet ready to quit, but will contact provider when she is  Initial goal documentation        Ms. Kieran was given information about Chronic Care Management services today including:  1. CCM service includes personalized support from designated clinical staff supervised by her physician, including individualized plan of care and coordination with other care providers 2. 24/7 contact phone numbers for assistance for urgent and routine care needs. 3. Standard insurance, coinsurance, copays and deductibles apply for chronic care management only during months in which we provide at least 20 minutes of  these services. Most insurances cover these services at 100%, however patients may be responsible for any copay, coinsurance and/or deductible if applicable. This service may help you avoid the need for more expensive face-to-face  services. 4. Only one practitioner may furnish and bill the service in a calendar month. 5. The patient may stop CCM services at any time (effective at the end of the month) by phone call to the office staff.  Patient agreed to services and verbal consent obtained.   The patient verbalized understanding of instructions provided today and agreed to receive a mailed copy of patient instruction and/or educational materials. Telephone follow up appointment with pharmacy team member scheduled for: 1 month  Charlene Brooke, PharmD Clinical Pharmacist [facility] [phone]   Cholesterol Content in Foods Cholesterol is a waxy, fat-like substance that helps to carry fat in the blood. The body needs cholesterol in small amounts, but too much cholesterol can cause damage to the arteries and heart. Most people should eat less than 200 milligrams (mg) of cholesterol a day.  Foods with cholesterol  Cholesterol is found in animal-based foods, such as meat, seafood, and dairy. Generally, low-fat dairy and lean meats have less cholesterol than full-fat dairy and fatty meats. The milligrams of cholesterol per serving (mg per serving) of common cholesterol-containing foods are listed below.  Meat and other proteins  Egg -- one large whole egg has 186 mg.  Veal shank -- 4 oz has 141 mg.  Lean ground [place] (93% lean) -- 4 oz has 118 mg.  Fat-trimmed lamb loin -- 4 oz has 106 mg.  Lean ground beef (90% lean) -- 4 oz has 100 mg.  Lobster -- 3.5 oz has 90 mg.  Pork loin chops -- 4 oz has 86 mg.  Canned salmon -- 3.5 oz has 83 mg.  Fat-trimmed beef top loin -- 4 oz has 78 mg.  Frankfurter -- 1 frank (3.5 oz) has 77 mg.  Crab -- 3.5 oz has 71 mg.  Roasted chicken without skin, white meat -- 4 oz has 66 mg.  Light bologna -- 2 oz has 45 mg.  Deli-cut [place] -- 2 oz has 31 mg.  Canned tuna -- 3.5 oz has 31 mg.  Berniece Salines -- 1 oz has 29 mg.  Oysters and mussels  (raw) -- 3.5 oz has 25 mg.  Mackerel -- 1 oz has 22 mg.  Trout -- 1 oz has 20 mg.  Pork sausage -- 1 link (1 oz) has 17 mg.  Salmon -- 1 oz has 16 mg.  Tilapia -- 1 oz has 14 mg. Dairy  Soft-serve ice cream --  cup (4 oz) has 103 mg.  Whole-milk yogurt -- 1 cup (8 oz) has 29 mg.  Cheddar cheese -- 1 oz has 28 mg.  American cheese -- 1 oz has 28 mg.  Whole milk -- 1 cup (8 oz) has 23 mg.  2% milk -- 1 cup (8 oz) has 18 mg.  Cream cheese -- 1 tablespoon (Tbsp) has 15 mg.  Cottage cheese --  cup (4 oz) has 14 mg.  Low-fat (1%) milk -- 1 cup (8 oz) has 10 mg.  Sour cream -- 1 Tbsp has 8.5 mg.  Low-fat yogurt -- 1 cup (8 oz) has 8 mg.  Nonfat Greek yogurt -- 1 cup (8 oz) has 7 mg.  Half-and-half cream -- 1 Tbsp has 5 mg. Fats and oils  Cod liver oil -- 1 tablespoon (Tbsp) has 82 mg.  Butter -- 1 Tbsp has 15 mg.  Lard -- 1 Tbsp has 14 mg.  Bacon grease -- 1 Tbsp has 14 mg.  Mayonnaise -- 1 Tbsp has 5-10 mg.  Margarine -- 1 Tbsp has 3-10 mg. Exact amounts of cholesterol in these foods may vary depending on specific ingredients and brands.  Foods without cholesterol Most plant-based foods do not have cholesterol unless you combine them with a food that has cholesterol. Foods without cholesterol include:  Grains and cereals.  Vegetables.  Fruits.  Vegetable oils, such as olive, canola, and sunflower oil.  Legumes, such as peas, beans, and lentils.  Nuts and seeds.  Egg whites. Summary  The body needs cholesterol in small amounts, but too much cholesterol can cause damage to the arteries and heart.  Most people should eat less than 200 milligrams (mg) of cholesterol a day. This information is not intended to replace advice given to you by your health care provider. Make sure you discuss any questions you have with your health care provider. Document Revised: 02/03/2017 Document Reviewed: 10/18/2016 Elsevier Patient Education  [place].

## 2019-06-11 ENCOUNTER — Encounter: Payer: Self-pay | Admitting: Internal Medicine

## 2019-06-11 ENCOUNTER — Ambulatory Visit (INDEPENDENT_AMBULATORY_CARE_PROVIDER_SITE_OTHER): Payer: Medicare HMO | Admitting: Internal Medicine

## 2019-06-11 VITALS — BP 152/72 | HR 73 | Temp 99.3°F | Resp 16 | Ht 66.0 in | Wt 152.0 lb

## 2019-06-11 DIAGNOSIS — R7303 Prediabetes: Secondary | ICD-10-CM

## 2019-06-11 DIAGNOSIS — R0609 Other forms of dyspnea: Secondary | ICD-10-CM

## 2019-06-11 DIAGNOSIS — L300 Nummular dermatitis: Secondary | ICD-10-CM | POA: Diagnosis not present

## 2019-06-11 DIAGNOSIS — R0600 Dyspnea, unspecified: Secondary | ICD-10-CM

## 2019-06-11 DIAGNOSIS — R42 Dizziness and giddiness: Secondary | ICD-10-CM | POA: Diagnosis not present

## 2019-06-11 DIAGNOSIS — F3289 Other specified depressive episodes: Secondary | ICD-10-CM

## 2019-06-11 DIAGNOSIS — J3089 Other allergic rhinitis: Secondary | ICD-10-CM | POA: Diagnosis not present

## 2019-06-11 DIAGNOSIS — E7849 Other hyperlipidemia: Secondary | ICD-10-CM

## 2019-06-11 DIAGNOSIS — J309 Allergic rhinitis, unspecified: Secondary | ICD-10-CM | POA: Insufficient documentation

## 2019-06-11 DIAGNOSIS — I10 Essential (primary) hypertension: Secondary | ICD-10-CM

## 2019-06-11 MED ORDER — FLUTICASONE PROPIONATE 50 MCG/ACT NA SUSP: 2.0000 | Freq: Every day | NASAL | 6 refills | Status: DC

## 2019-06-11 MED ORDER — ROSUVASTATIN CALCIUM 10 MG PO TABS: 10.0000 mg | ORAL_TABLET | Freq: Every day | ORAL | 1 refills | Status: DC

## 2019-06-11 MED ORDER — TRIAMCINOLONE ACETONIDE 0.5 % EX OINT: 1.0000 "application " | TOPICAL_OINTMENT | Freq: Two times a day (BID) | CUTANEOUS | 0 refills | Status: DC

## 2019-06-11 NOTE — Assessment & Plan Note (Signed)
Chronic She is interested in seeing a nutritionist and I do believe she would benefit from 1 Referral ordered

## 2019-06-11 NOTE — Assessment & Plan Note (Addendum)
Chronic Agrees crestor 10 mg daily Recheck hepatic function, lipids in 6-8 weeks Encourage regular exercise and healthy diet

## 2019-06-11 NOTE — Assessment & Plan Note (Signed)
Chronic Blood pressure elevated here today, but typically better controlled Continue current medications

## 2019-06-11 NOTE — Assessment & Plan Note (Signed)
Acute Lesions look like discoid eczema Trial of triamcinolone twice daily Can also apply Vaseline Advised her to avoid alcohol and hydrogen peroxide Avoid too much pressure and itching the area

## 2019-06-11 NOTE — Assessment & Plan Note (Addendum)
Chronic Smoking related Discussed smoking cessation-discussed that she needs to break the habit of smoking, the emotional ties and the physical addiction Discussed that trying the patches would be a good idea, But she would like to work on breaking the habit and the emotional ties first Advised that she should also discuss with her psychiatrist Discussed that she can call 1 800 quit number for free patches or she can let me know and I can send in a prescription

## 2019-06-11 NOTE — Assessment & Plan Note (Signed)
Your wound Discussed over-the-counter oral antihistamine, Flonase, saline nasal spray, Singulair She would like a prescription for Flonase-sent Advised that she needs to try different combinations of the above medications to see what helps

## 2019-06-11 NOTE — Assessment & Plan Note (Signed)
Acute She had 2 episodes last week-both occurred in the evening after standing and it sounds more like a atypical vertigo/lightheadedness Blood pressure was okay and she had been eating and drinking normally She did not experience it during the day She has not had any episodes since then At this point we will just monitor

## 2019-06-11 NOTE — Patient Instructions (Addendum)
  Blood work was ordered - to be done in 6-8 weeks at [facility] - this should be fasting.     Medications reviewed and updated.  Changes include :  Triamcinolone cream for your lesions.  You can also try vaseline.    Start crestor 10 mg daily.  Start flonase for your allergies.    Your prescription(s) have been submitted to your pharmacy. Please take as directed and contact our office if you believe you are having problem(s) with the medication(s).  A referral was ordered for nutritionist.     Someone will call you to schedule this.    Please followup in 6 months

## 2019-06-11 NOTE — Assessment & Plan Note (Signed)
States that she has been experiencing some depression She wonders about adding Prozac to her current regimen She does follow with psychiatrist and advised that she will need to discuss this with him

## 2019-06-15 NOTE — Addendum Note (Signed)
Addended by: Karle Barr on: 06/15/2019 07:02 AM   Modules accepted: Orders

## 2019-06-20 ENCOUNTER — Telehealth: Payer: Self-pay

## 2019-06-20 ENCOUNTER — Other Ambulatory Visit: Payer: Self-pay

## 2019-06-20 ENCOUNTER — Ambulatory Visit (INDEPENDENT_AMBULATORY_CARE_PROVIDER_SITE_OTHER): Payer: Medicare HMO | Admitting: Internal Medicine

## 2019-06-20 ENCOUNTER — Encounter: Payer: Self-pay | Admitting: Internal Medicine

## 2019-06-20 ENCOUNTER — Ambulatory Visit (INDEPENDENT_AMBULATORY_CARE_PROVIDER_SITE_OTHER): Payer: Medicare HMO

## 2019-06-20 VITALS — BP 160/82 | HR 67 | Temp 99.0°F | Ht 66.0 in | Wt 155.0 lb

## 2019-06-20 DIAGNOSIS — J410 Simple chronic bronchitis: Secondary | ICD-10-CM

## 2019-06-20 DIAGNOSIS — R55 Syncope and collapse: Secondary | ICD-10-CM

## 2019-06-20 DIAGNOSIS — R05 Cough: Secondary | ICD-10-CM | POA: Diagnosis not present

## 2019-06-20 LAB — BRAIN NATRIURETIC PEPTIDE: Pro B Natriuretic peptide (BNP): 25 pg/mL (ref 0.0–100.0)

## 2019-06-20 LAB — FERRITIN: Ferritin: 48.9 ng/mL (ref 10.0–291.0)

## 2019-06-20 LAB — VITAMIN D 25 HYDROXY (VIT D DEFICIENCY, FRACTURES): VITD: 29.39 ng/mL — ABNORMAL LOW (ref 30.00–100.00)

## 2019-06-20 NOTE — Patient Instructions (Signed)
We will get labs and chest x-ray today.  We will get the ultrasound of the neck to check the blood flow.

## 2019-06-20 NOTE — Progress Notes (Signed)
   Subjective:   Patient ID: Tammy Stevens, female    DOB: 03-17-1945, 74 y.o.   MRN: [id number]  HPI The patient is a 74 YO female coming in for concerns about almost passing out. This happened prior to seeing PCP about 2 weeks ago and she had to sit down. She was able to do so and not pass out but was standing at the door. Since that time she has had several more episodes of similar feeling but also did not pass out. She had these episodes while sitting down and not while actively stressed. She describes vision going kind of dim and squiggly. Feeling like she would pass out. Is a current smoker and never evaluated carotids. Concerned about stroke with this or mini-stroke. Denies GI illness, diarrhea, constipation. Chronic smoker's cough not different lately. Denies chest pains or SOB. Denies change in weight recently. Daughter is present also and helps to provide history.   PMH, [facility], social history reviewed and updated  Review of Systems  Constitutional: Negative.   HENT: Negative.   Eyes: Negative.   Respiratory: Positive for cough. Negative for choking, chest tightness and shortness of breath.   Cardiovascular: Negative for chest pain, palpitations and leg swelling.  Gastrointestinal: Negative for abdominal distention, abdominal pain, constipation, diarrhea, nausea and vomiting.  Musculoskeletal: Negative.   Skin: Negative.   Neurological: Positive for dizziness and light-headedness. Negative for tremors, seizures, syncope, facial asymmetry, speech difficulty, weakness, numbness and headaches.  Psychiatric/Behavioral: Negative.     Objective:  Physical Exam Constitutional:      Appearance: She is well-developed.  HENT:     Head: Normocephalic and atraumatic.  Cardiovascular:     Rate and Rhythm: Normal rate and regular rhythm.  Pulmonary:     Effort: Pulmonary effort is normal. No respiratory distress.     Breath sounds: Normal breath sounds. No wheezing or rales.  Abdominal:   General: Bowel sounds are normal. There is no distension.     Palpations: Abdomen is soft.     Tenderness: There is no abdominal tenderness. There is no rebound.  Musculoskeletal:     Cervical back: Normal range of motion.  Skin:    General: Skin is warm and dry.  Neurological:     Mental Status: She is alert and oriented to person, place, and time.     Coordination: Coordination normal.  Psychiatric:     Comments: Speech slightly pressured     Vitals:   06/20/19 1317  BP: (!) 160/82  Pulse: 67  Temp: 99 F (37.2 C)  TempSrc: Oral  SpO2: 97%  Weight: 155 lb (70.3 kg)  Height: 5\' 6"  (1.676 m)    This visit occurred during the SARS-CoV-2 public health emergency.  Safety protocols were in place, including screening questions prior to the visit, additional usage of staff PPE, and extensive cleaning of exam room while observing appropriate contact time as indicated for disinfecting solutions.   Assessment & Plan:  Visit time 25 minutes in face to face communication with patient and coordination of care, additional 10 minutes spent in record review, coordination or care, ordering tests, communicating/referring to other healthcare professionals, documenting in medical records all on the same day of the visit for total time 35 minutes spent on the visit.

## 2019-06-20 NOTE — Telephone Encounter (Signed)
F/u   The patient called back asking for an appt today with Dr. Quay Burow or to be worked in.   I explain to the patient the MD is working a half-day today her scheduled ends @ 11:15 am and Dr. Quay Burow is not in the office on Friday, April 16th.   The patient is aware that Dr. Sharlet Salina has two open time slots for today.   The patient appt is made with Dr. Sharlet Salina for today @ 1:20 pm

## 2019-06-20 NOTE — Telephone Encounter (Signed)
New message    The patient C/o two more slight fainting spell this week   The patient was added new medication rosuvastatin (CRESTOR) 10 MG tablet   The patient was offered an appt with another MD for today & tomorrow, patient wanted the messages sent around first to MD to review.

## 2019-06-20 NOTE — Telephone Encounter (Signed)
FYI

## 2019-06-21 ENCOUNTER — Encounter: Payer: Self-pay | Admitting: Internal Medicine

## 2019-06-21 DIAGNOSIS — R55 Syncope and collapse: Secondary | ICD-10-CM | POA: Insufficient documentation

## 2019-06-21 LAB — IRON, TOTAL/TOTAL IRON BINDING CAP
%SAT: 15 % (calc) — ABNORMAL LOW (ref 16–45)
Iron: 58 ug/dL (ref 45–160)
TIBC: 380 mcg/dL (calc) (ref 250–450)

## 2019-06-21 NOTE — Assessment & Plan Note (Signed)
Given ongoing and long term smoker ordered carotid doppler to assess for blockages. No bruit on exam. Checking iron levels as well as vitamin D and BNP to rule out alternate causes of pre-syncope. Offered MRI brain or CT head but they decline at this time. If she has more episodes they will consider this.

## 2019-06-21 NOTE — Assessment & Plan Note (Signed)
Ordered CXR and advised to quit smoking.

## 2019-06-26 ENCOUNTER — Ambulatory Visit (HOSPITAL_COMMUNITY)
Admission: RE | Admit: 2019-06-26 | Payer: Medicare HMO | Source: Ambulatory Visit | Attending: Internal Medicine | Admitting: Internal Medicine

## 2019-07-04 ENCOUNTER — Ambulatory Visit (HOSPITAL_COMMUNITY): Payer: Medicare HMO

## 2019-07-21 ENCOUNTER — Other Ambulatory Visit: Payer: Self-pay | Admitting: Internal Medicine

## 2019-07-22 ENCOUNTER — Telehealth: Payer: Medicare HMO

## 2019-07-23 ENCOUNTER — Other Ambulatory Visit: Payer: Self-pay | Admitting: Internal Medicine

## 2019-07-24 DIAGNOSIS — F3181 Bipolar II disorder: Secondary | ICD-10-CM | POA: Diagnosis not present

## 2019-08-06 ENCOUNTER — Encounter: Payer: Self-pay | Admitting: Internal Medicine

## 2019-08-06 DIAGNOSIS — H43813 Vitreous degeneration, bilateral: Secondary | ICD-10-CM | POA: Diagnosis not present

## 2019-08-06 DIAGNOSIS — H524 Presbyopia: Secondary | ICD-10-CM | POA: Diagnosis not present

## 2019-08-06 DIAGNOSIS — H25013 Cortical age-related cataract, bilateral: Secondary | ICD-10-CM | POA: Diagnosis not present

## 2019-08-06 DIAGNOSIS — H2513 Age-related nuclear cataract, bilateral: Secondary | ICD-10-CM | POA: Diagnosis not present

## 2019-08-08 ENCOUNTER — Encounter: Payer: Self-pay | Admitting: Internal Medicine

## 2019-08-08 DIAGNOSIS — H35052 Retinal neovascularization, unspecified, left eye: Secondary | ICD-10-CM | POA: Diagnosis not present

## 2019-08-08 DIAGNOSIS — H25813 Combined forms of age-related cataract, bilateral: Secondary | ICD-10-CM | POA: Diagnosis not present

## 2019-08-08 DIAGNOSIS — H35363 Drusen (degenerative) of macula, bilateral: Secondary | ICD-10-CM | POA: Diagnosis not present

## 2019-08-08 DIAGNOSIS — H35722 Serous detachment of retinal pigment epithelium, left eye: Secondary | ICD-10-CM | POA: Diagnosis not present

## 2019-08-08 DIAGNOSIS — H3562 Retinal hemorrhage, left eye: Secondary | ICD-10-CM | POA: Diagnosis not present

## 2019-08-12 ENCOUNTER — Telehealth: Payer: Self-pay

## 2019-08-12 MED ORDER — DIAZEPAM 5 MG PO TABS: 5.0000 mg | ORAL_TABLET | Freq: Two times a day (BID) | ORAL | 0 refills | Status: DC | PRN

## 2019-08-12 NOTE — Telephone Encounter (Signed)
Patient is requesting medication sent into today   1.Medication Requested:diazepam (VALIUM) 5 MG tablet  2. Pharmacy (Name, [place], [place]):[id number] - [place], [place] - [address] AT [address]  3. On Med List: Yes   4. Last Visit with PCP: 4.6.21  5. Next visit date with PCP: 10.6.21    Agent: Please be advised that RX refills may take up to 3 business days. We ask that you follow-up with your pharmacy.

## 2019-08-13 DIAGNOSIS — H353221 Exudative age-related macular degeneration, left eye, with active choroidal neovascularization: Secondary | ICD-10-CM | POA: Diagnosis not present

## 2019-08-22 ENCOUNTER — Encounter: Payer: Self-pay | Admitting: Internal Medicine

## 2019-08-22 MED ORDER — ROSUVASTATIN CALCIUM 10 MG PO TABS: 10.0000 mg | ORAL_TABLET | Freq: Every day | ORAL | 1 refills | Status: DC

## 2019-09-04 DIAGNOSIS — H5711 Ocular pain, right eye: Secondary | ICD-10-CM | POA: Diagnosis not present

## 2019-09-05 DIAGNOSIS — H25811 Combined forms of age-related cataract, right eye: Secondary | ICD-10-CM | POA: Diagnosis not present

## 2019-09-05 DIAGNOSIS — H2511 Age-related nuclear cataract, right eye: Secondary | ICD-10-CM | POA: Diagnosis not present

## 2019-09-05 DIAGNOSIS — H25011 Cortical age-related cataract, right eye: Secondary | ICD-10-CM | POA: Diagnosis not present

## 2019-09-05 DIAGNOSIS — H52221 Regular astigmatism, right eye: Secondary | ICD-10-CM | POA: Diagnosis not present

## 2019-09-25 DIAGNOSIS — H353221 Exudative age-related macular degeneration, left eye, with active choroidal neovascularization: Secondary | ICD-10-CM | POA: Diagnosis not present

## 2019-10-07 ENCOUNTER — Encounter: Payer: Self-pay | Admitting: Internal Medicine

## 2019-10-07 NOTE — Progress Notes (Signed)
Virtual Visit via Video Note  I connected with Tammy Stevens on 10/08/19 at  9:00 AM EDT by a video enabled telemedicine application and verified that I am speaking with the correct person using two identifiers.   I discussed the limitations of evaluation and management by telemedicine and the availability of in person appointments. The patient expressed understanding and agreed to proceed.  Present for the visit:  Myself, Dr Billey Gosling, [name].  The patient is currently at home and I am in the office.    No referring provider.    History of Present Illness: She is here for an acute visit for cold symptoms.   Her symptoms started 2 weeks ago.  She is experiencing constant headache and right maxillary sinus pain.  This is typical for her sinus infections.  Nothing she tries is helping.    She has tried taking otc cod med- tylenol severe sinus   Review of Systems  Constitutional: Negative for chills and fever.  HENT: Positive for congestion and sinus pain (right maxillary). Negative for ear pain and sore throat.   Respiratory: Negative for cough, shortness of breath and wheezing.   Gastrointestinal: Negative for diarrhea and nausea.  Neurological: Positive for headaches. Negative for dizziness.      Social History   Socioeconomic History  . Marital status: Divorced    Spouse name: Not on file  . Number of children: 2  . Years of education: Not on file  . Highest education level: Not on file  Occupational History  . Occupation: sedentary    Employer: [employer]  Tobacco Use  . Smoking status: Current Every Day Smoker  . Smokeless tobacco: Never Used  Vaping Use  . Vaping Use: Never used  Substance and Sexual Activity  . Alcohol use: No    Alcohol/week: 0.0 standard drinks    Comment: Quit 2008  . Drug use: No  . Sexual activity: Not Currently  Other Topics Concern  . Not on file  Social History Narrative  . Not on file   Social Determinants of Health    Financial Resource Strain:   . Difficulty of Paying Living Expenses:   Food Insecurity:   . Worried About Charity fundraiser in the Last Year:   . Arboriculturist in the Last Year:   Transportation Needs:   . Film/video editor (Medical):   Marland Kitchen Lack of Transportation (Non-Medical):   Physical Activity:   . Days of Exercise per Week:   . Minutes of Exercise per Session:   Stress:   . Feeling of Stress :   Social Connections:   . Frequency of Communication with Friends and Family:   . Frequency of Social Gatherings with Friends and Family:   . Attends Religious Services:   . Active Member of Clubs or Organizations:   . Attends Archivist Meetings:   Marland Kitchen Marital Status:      Observations/Objective: Appears well in NAD Breathing normally Skin appears warm and dry  Assessment and Plan:  See Problem List for Assessment and Plan of chronic medical problems.   Follow Up Instructions:    I discussed the assessment and treatment plan with the patient. The patient was provided an opportunity to ask questions and all were answered. The patient agreed with the plan and demonstrated an understanding of the instructions.   The patient was advised to call back or seek an in-person evaluation if the symptoms worsen or if the condition  fails to improve as anticipated.    [name], MD

## 2019-10-08 ENCOUNTER — Telehealth (INDEPENDENT_AMBULATORY_CARE_PROVIDER_SITE_OTHER): Payer: Medicare HMO | Admitting: Internal Medicine

## 2019-10-08 ENCOUNTER — Encounter: Payer: Self-pay | Admitting: Internal Medicine

## 2019-10-08 DIAGNOSIS — J0100 Acute maxillary sinusitis, unspecified: Secondary | ICD-10-CM | POA: Diagnosis not present

## 2019-10-08 MED ORDER — DOXYCYCLINE HYCLATE 100 MG PO TABS
100.0000 mg | ORAL_TABLET | Freq: Two times a day (BID) | ORAL | 0 refills | Status: DC
Start: 2019-10-08 — End: 2019-11-01

## 2019-10-08 NOTE — Assessment & Plan Note (Signed)
Likely bacterial  Start doxycycline otc cold medications Rest, fluid Call if no improvement

## 2019-10-31 NOTE — Progress Notes (Signed)
Virtual Visit via Video Note  I connected with Tammy Stevens on 10/31/19 at  2:15 PM EDT by a video enabled telemedicine application and verified that I am speaking with the correct person using two identifiers.   I discussed the limitations of evaluation and management by telemedicine and the availability of in person appointments. The patient expressed understanding and agreed to proceed.  Present for the visit:  Myself, Dr Billey Gosling, [name].  The patient is currently at home and I am in the office.    No referring provider.    History of Present Illness: This visit is for an acute visit for cough   For a while she has had a feeling like she was going to get sick.  She has had severe lightheadedness/dizziness, weak to the point that she has to lay down.  It can last up to a couple of days or two weeks.  During that times she can only lay down.  It has progressively gotten worse - it started several months ago.  There is no relation to medications - there has been no change.  ? Related to eating - she is not sure, but it has occurred when she has eaten.  She has associated sweating with episodes.  She has to instantly lay down. She thinks she may have some palps.  She denies CP.  She was having some SOB and quit smoking.  She had a cough prior to quitting and had worsening of cough after quitting, which concerned her.  She ended up taking an allergy med and it helped.  She is still coughing.  It is a dry cough.      Review of Systems  Constitutional: Negative for fever.  HENT: Positive for congestion (chronic). Negative for ear pain, sinus pain and sore throat.   Respiratory: Positive for cough (dry - improved wtih allergy med), shortness of breath and wheezing (w/ cough).   Cardiovascular: Positive for palpitations. Negative for chest pain and leg swelling.  Neurological: Positive for headaches (occ, nothing new).      Social History   Socioeconomic History  . Marital  status: Divorced    Spouse name: Not on file  . Number of children: 2  . Years of education: Not on file  . Highest education level: Not on file  Occupational History  . Occupation: sedentary    Employer: [employer]  Tobacco Use  . Smoking status: Current Every Day Smoker  . Smokeless tobacco: Never Used  Vaping Use  . Vaping Use: Never used  Substance and Sexual Activity  . Alcohol use: No    Alcohol/week: 0.0 standard drinks    Comment: Quit 2008  . Drug use: No  . Sexual activity: Not Currently  Other Topics Concern  . Not on file  Social History Narrative  . Not on file   Social Determinants of Health   Financial Resource Strain:   . Difficulty of Paying Living Expenses: Not on file  Food Insecurity:   . Worried About Charity fundraiser in the Last Year: Not on file  . Ran Out of Food in the Last Year: Not on file  Transportation Needs:   . Lack of Transportation (Medical): Not on file  . Lack of Transportation (Non-Medical): Not on file  Physical Activity:   . Days of Exercise per Week: Not on file  . Minutes of Exercise per Session: Not on file  Stress:   . Feeling of Stress : Not  on file  Social Connections:   . Frequency of Communication with Friends and Family: Not on file  . Frequency of Social Gatherings with Friends and Family: Not on file  . Attends Religious Services: Not on file  . Active Member of Clubs or Organizations: Not on file  . Attends Archivist Meetings: Not on file  . Marital Status: Not on file     Observations/Objective: Appears well in NAD Breathing normally Skin appears warm and dry  Assessment and Plan:  See Problem List for Assessment and Plan of chronic medical problems.   Follow Up Instructions:    I discussed the assessment and treatment plan with the patient. The patient was provided an opportunity to ask questions and all were answered. The patient agreed with the plan and demonstrated an understanding  of the instructions.   The patient was advised to call back or seek an in-person evaluation if the symptoms worsen or if the condition fails to improve as anticipated.    [name], MD

## 2019-11-01 ENCOUNTER — Encounter: Payer: Self-pay | Admitting: Internal Medicine

## 2019-11-01 ENCOUNTER — Telehealth (INDEPENDENT_AMBULATORY_CARE_PROVIDER_SITE_OTHER): Payer: Medicare HMO | Admitting: Internal Medicine

## 2019-11-01 DIAGNOSIS — R059 Cough, unspecified: Secondary | ICD-10-CM

## 2019-11-01 DIAGNOSIS — R002 Palpitations: Secondary | ICD-10-CM

## 2019-11-01 DIAGNOSIS — R05 Cough: Secondary | ICD-10-CM | POA: Diagnosis not present

## 2019-11-01 DIAGNOSIS — R42 Dizziness and giddiness: Secondary | ICD-10-CM | POA: Diagnosis not present

## 2019-11-01 MED ORDER — LORATADINE 10 MG PO TABS
10.0000 mg | ORAL_TABLET | Freq: Every day | ORAL | 11 refills | Status: DC
Start: 2019-11-01 — End: 2019-12-30

## 2019-11-01 NOTE — Assessment & Plan Note (Signed)
Acute Episodes of lightheadedness/dizziness/weakness, palpitations/sweating -- occurring for several months, but increasing in frequency and becoming worse No obvious pattern - she will try to keep a log  ? Cardiac - arrhythmia  Will refer to cardio Blood work earlier this year w/in normal limits so will hold off on additional blood work since this is virtual  She will call if symptoms worsen

## 2019-11-01 NOTE — Assessment & Plan Note (Signed)
Acute on chronic Has a chronic cough from smoking - worse since quitting last month, but improved with claritin No evidence of infection Continue daily claritin Discussed how quitting smoking can cause the cough to get worse initially No further evaluation at this time

## 2019-11-07 ENCOUNTER — Encounter: Payer: Self-pay | Admitting: Internal Medicine

## 2019-11-08 MED ORDER — CEFDINIR 300 MG PO CAPS
300.0000 mg | ORAL_CAPSULE | Freq: Two times a day (BID) | ORAL | 0 refills | Status: DC
Start: 2019-11-08 — End: 2019-12-11

## 2019-11-08 NOTE — Addendum Note (Signed)
Addended by: [name] on: 11/08/2019 05:08 PM   Modules accepted: Orders

## 2019-11-08 NOTE — Telephone Encounter (Signed)
Pt called and stated she is getting worse and wondering if she needs an antibiotic? Please advise.

## 2019-11-13 ENCOUNTER — Ambulatory Visit: Payer: Medicare HMO | Admitting: Internal Medicine

## 2019-11-18 ENCOUNTER — Encounter: Payer: Self-pay | Admitting: Internal Medicine

## 2019-11-18 DIAGNOSIS — R5381 Other malaise: Secondary | ICD-10-CM

## 2019-11-21 MED ORDER — HYDROCODONE-HOMATROPINE 5-1.5 MG/5ML PO SYRP: 5.0000 mL | ORAL_SOLUTION | Freq: Three times a day (TID) | ORAL | 0 refills | Status: DC | PRN

## 2019-11-21 NOTE — Addendum Note (Signed)
Addended by: [name] on: 11/21/2019 05:33 PM   Modules accepted: Orders

## 2019-12-02 ENCOUNTER — Encounter: Payer: Self-pay | Admitting: Internal Medicine

## 2019-12-04 DIAGNOSIS — F3181 Bipolar II disorder: Secondary | ICD-10-CM | POA: Diagnosis not present

## 2019-12-04 DIAGNOSIS — F4323 Adjustment disorder with mixed anxiety and depressed mood: Secondary | ICD-10-CM | POA: Diagnosis not present

## 2019-12-04 MED ORDER — HYDROCODONE-HOMATROPINE 5-1.5 MG/5ML PO SYRP: 5.0000 mL | ORAL_SOLUTION | Freq: Three times a day (TID) | ORAL | 0 refills | Status: DC | PRN

## 2019-12-04 NOTE — Addendum Note (Signed)
Addended by: [name] on: 12/04/2019 05:54 AM   Modules accepted: Orders

## 2019-12-08 ENCOUNTER — Encounter: Payer: Self-pay | Admitting: Internal Medicine

## 2019-12-09 DIAGNOSIS — F3181 Bipolar II disorder: Secondary | ICD-10-CM | POA: Diagnosis not present

## 2019-12-09 DIAGNOSIS — F4323 Adjustment disorder with mixed anxiety and depressed mood: Secondary | ICD-10-CM | POA: Diagnosis not present

## 2019-12-09 NOTE — Telephone Encounter (Signed)
I reached out to [facility] and he said when they reached out to pt, the daughter stated she already has someone coming out and she did not tell them which company.  Thanks Cecille Rubin

## 2019-12-10 ENCOUNTER — Encounter: Payer: Self-pay | Admitting: Internal Medicine

## 2019-12-10 NOTE — Progress Notes (Deleted)
Subjective:    Patient ID: Tammy Stevens, female    DOB: 1945/07/06, 74 y.o.   MRN: [id number]  HPI The patient is here for follow up of their chronic medical problems, including htn, GERD, prediabetes, CKD, hyperlipemia  She also has physical deconditioning due to prolonged URI.    Medications and allergies reviewed with patient and updated if appropriate.  Patient Active Problem List   Diagnosis Date Noted  . Cough 11/01/2019  . Pre-syncope 06/21/2019  . Allergic rhinitis 06/11/2019  . 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 Episodic lightheadedness 06/11/2019  . Sinus infection 03/12/2019  . Tobacco abuse 07/23/2018  . CKD (chronic kidney disease) stage 3, GFR 30-59 ml/min (HCC) 07/22/2018  . Prediabetes 10/11/2017  . Tingling 10/11/2017  . Palpitations 07/28/2017  . DOE (dyspnea on exertion) 07/28/2017  . Diastolic dysfunction 05/39/7673  . Memory difficulties 09/23/2015  . Skin cancer 09/23/2015  . Bipolar disorder ([place]) 02/09/2015  . Headache around the eyes 04/17/2014  . Cystocele 06/24/2013  . Smokers' cough ([place]) 09/07/2010  . FATTY LIVER DISEASE 02/18/2010  . CHOLELITHIASIS, ASYMPTOMATIC 02/18/2010  . 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 IRRITABLE BOWEL SYNDROME 02/16/2010  . GERD 02/11/2010  . Hyperlipidemia 12/22/2009  . OSA (obstructive sleep apnea) 12/22/2009  . Depression 11/24/2009  . Essential hypertension 11/24/2009    Current Outpatient Medications on File Prior to Visit  Medication Sig Dispense Refill  . amLODipine (NORVASC) 5 MG tablet TAKE 1 TABLET BY MOUTH EVERY DAY 90 tablet 1  . aspirin EC 81 MG tablet Take 81 mg by mouth daily.    Marland Kitchen buPROPion (WELLBUTRIN XL) 150 MG 24 hr tablet Take 1 tablet by mouth daily. In afternoon, in addition to 300 mg    . buPROPion (WELLBUTRIN XL) 300 MG 24 hr tablet Take 1 tablet by mouth daily.     . cefdinir (OMNICEF) 300 MG capsule Take 1 capsule (300 mg total) by mouth 2 (two) times daily. 20 capsule 0  .  diazepam (VALIUM) 5 MG tablet Take 1 tablet (5 mg total) by mouth every 12 (twelve) hours as needed for anxiety. 20 tablet 0  . fluticasone (FLONASE) 50 MCG/ACT nasal spray Place 2 sprays into both nostrils daily. 16 g 6  . gabapentin (NEURONTIN) 300 MG capsule Take 300 mg by mouth 2 (two) times daily.   1  . hydrochlorothiazide (MICROZIDE) 12.5 MG capsule TAKE 1 CAPSULE BY MOUTH EVERY DAY 90 capsule 1  . HYDROcodone-homatropine (HYCODAN) 5-1.5 MG/5ML syrup Take 5 mLs by mouth every 8 (eight) hours as needed for cough. 120 mL 0  . lamoTRIgine (LAMICTAL) 200 MG tablet Take 200 mg by mouth 2 (two) times daily.   1  . loratadine (CLARITIN) 10 MG tablet Take 1 tablet (10 mg total) by mouth daily. 30 tablet 11  . Multiple Vitamin (MULTIVITAMIN) tablet Take 1 tablet by mouth daily.    Marland Kitchen omeprazole (PRILOSEC) 20 MG capsule TAKE 1 CAPSULE BY MOUTH EVERY DAY 90 capsule 0  . rosuvastatin (CRESTOR) 10 MG tablet Take 1 tablet (10 mg total) by mouth daily. 90 tablet 1  . triamcinolone ointment (KENALOG) 0.5 % Apply 1 application topically 2 (two) times daily. 30 g 0   No current facility-administered medications on file prior to visit.    Past Medical History:  Diagnosis Date  . Allergic rhinitis   . Allergy   . Bipolar disorder ([place])   . Blood transfusion without reported diagnosis    as child  .  Cholelithiasis    See U/S 12/11  . Depression    DX with Bipolar in the past   . Depression   . Diverticulosis   . Dyspnea    Spirometry 09/26/2008 FEV1 1.85 (122%), FVC 2.57(122%),  FEV1% 72  . Elevated liver enzymes   . Fatty liver    See U/S 12/11  . GERD (gastroesophageal reflux disease)   . History of alcohol abuse    Quit 2008  . History of drug abuse ([place])    Quit 2008   . Hyperlipemia   . Hypertension   . Peripheral neuropathy   . Sleep apnea    on CPAP    Past Surgical History:  Procedure Laterality Date  . BREAST LUMPECTOMY    . BTL    . COLONOSCOPY    . SKIN GRAFT     after  Clotee Schlicker as a child   . TUBAL LIGATION      Social History   Socioeconomic History  . Marital status: Divorced    Spouse name: Not on file  . Number of children: 2  . Years of education: Not on file  . Highest education level: Not on file  Occupational History  . Occupation: sedentary    Employer: [employer]  Tobacco Use  . Smoking status: Former Smoker    Quit date: 09/25/2019    Years since quitting: 0.2  . Smokeless tobacco: Never Used  Vaping Use  . Vaping Use: Never used  Substance and Sexual Activity  . Alcohol use: No    Alcohol/week: 0.0 standard drinks    Comment: Quit 2008  . Drug use: No  . Sexual activity: Not Currently  Other Topics Concern  . Not on file  Social History Narrative  . Not on file   Social Determinants of Health   Financial Resource Strain:   . Difficulty of Paying Living Expenses: Not on file  Food Insecurity:   . Worried About Charity fundraiser in the Last Year: Not on file  . Ran Out of Food in the Last Year: Not on file  Transportation Needs:   . Lack of Transportation (Medical): Not on file  . Lack of Transportation (Non-Medical): Not on file  Physical Activity:   . Days of Exercise per Week: Not on file  . Minutes of Exercise per Session: Not on file  Stress:   . Feeling of Stress : Not on file  Social Connections:   . Frequency of Communication with Friends and Family: Not on file  . Frequency of Social Gatherings with Friends and Family: Not on file  . Attends Religious Services: Not on file  . Active Member of Clubs or Organizations: Not on file  . Attends Archivist Meetings: Not on file  . Marital Status: Not on file    Family History  Problem Relation Age of Onset  . Diabetes Other        GM? (not specific)  . Heart attack Other        GF? (not specific)  . Asthma Son   . Asthma Father   . Alcohol abuse Father   . Suicidality Father   . Depression Father   . Allergies Sister   . Colon cancer Neg  Hx   . Esophageal cancer Neg Hx   . Rectal cancer Neg Hx   . Stomach cancer Neg Hx     Review of Systems     Objective:  There were no vitals filed  for this visit. BP Readings from Last 3 Encounters:  06/20/19 (!) 160/82  06/11/19 (!) 152/72  05/02/18 122/70   Wt Readings from Last 3 Encounters:  06/20/19 155 lb (70.3 kg)  06/11/19 152 lb (68.9 kg)  05/02/18 149 lb 12.8 oz (67.9 kg)   There is no height or weight on file to calculate BMI.   Physical Exam    Constitutional: Appears well-developed and well-nourished. No distress.  HENT:  Head: Normocephalic and atraumatic.  Neck: Neck supple. No tracheal deviation present. No thyromegaly present.  No cervical lymphadenopathy Cardiovascular: Normal rate, regular rhythm and normal heart sounds.   No murmur heard. No carotid bruit .  No edema Pulmonary/Chest: Effort normal and breath sounds normal. No respiratory distress. No has no wheezes. No rales.  Skin: Skin is warm and dry. Not diaphoretic.  Psychiatric: Normal mood and affect. Behavior is normal.      Assessment & Plan:    See Problem List for Assessment and Plan of chronic medical problems.    This visit occurred during the SARS-CoV-2 public health emergency.  Safety protocols were in place, including screening questions prior to the visit, additional usage of staff PPE, and extensive cleaning of exam room while observing appropriate contact time as indicated for disinfecting solutions.

## 2019-12-10 NOTE — Patient Instructions (Signed)
  Blood work was ordered.     Medications reviewed and updated.  Changes include :     Your prescription(s) have been submitted to your pharmacy. Please take as directed and contact our office if you believe you are having problem(s) with the medication(s).  A referral was ordered for        Someone from their office will call you to schedule an appointment.    Please followup in 6 months   

## 2019-12-11 ENCOUNTER — Encounter: Payer: Self-pay | Admitting: Internal Medicine

## 2019-12-11 ENCOUNTER — Ambulatory Visit (INDEPENDENT_AMBULATORY_CARE_PROVIDER_SITE_OTHER): Payer: Medicare HMO | Admitting: Internal Medicine

## 2019-12-11 DIAGNOSIS — N1831 Chronic kidney disease, stage 3a: Secondary | ICD-10-CM | POA: Diagnosis not present

## 2019-12-11 DIAGNOSIS — R059 Cough, unspecified: Secondary | ICD-10-CM | POA: Diagnosis not present

## 2019-12-11 DIAGNOSIS — R7303 Prediabetes: Secondary | ICD-10-CM | POA: Diagnosis not present

## 2019-12-11 DIAGNOSIS — I10 Essential (primary) hypertension: Secondary | ICD-10-CM | POA: Diagnosis not present

## 2019-12-11 DIAGNOSIS — E7849 Other hyperlipidemia: Secondary | ICD-10-CM | POA: Diagnosis not present

## 2019-12-11 DIAGNOSIS — K219 Gastro-esophageal reflux disease without esophagitis: Secondary | ICD-10-CM

## 2019-12-11 MED ORDER — HYDROCODONE-HOMATROPINE 5-1.5 MG/5ML PO SYRP
5.0000 mL | ORAL_SOLUTION | Freq: Three times a day (TID) | ORAL | 0 refills | Status: DC | PRN
Start: 2019-12-11 — End: 2019-12-30

## 2019-12-11 MED ORDER — FLUTICASONE-SALMETEROL 100-50 MCG/DOSE IN AEPB: 1.0000 | INHALATION_SPRAY | Freq: Two times a day (BID) | RESPIRATORY_TRACT | 3 refills | Status: DC

## 2019-12-11 NOTE — Assessment & Plan Note (Signed)
Subacute At this point she no longer has an infection Cough is either post infectious or related to her quitting smoking She may also have an element of COPD Will try Advair inhaler 1 puff twice daily I will give her another prescription of Hycodan cough syrup, but advised I cannot keep refilling this because it does contain hydrocodone Okay to use over-the-counter cough drops

## 2019-12-11 NOTE — Assessment & Plan Note (Signed)
Chronic Encouraged to make sure she is drinking enough fluids Check blood work at her next appointment

## 2019-12-11 NOTE — Assessment & Plan Note (Signed)
Chronic Encouraged low sugar/carbohydrate diet We will be requesting physical therapy for her deconditioning, which will hopefully help increase her activity

## 2019-12-11 NOTE — Assessment & Plan Note (Signed)
Chronic Continue Crestor 10 mg daily

## 2019-12-11 NOTE — Assessment & Plan Note (Signed)
Chronic Controlled Continue omeprazole 20 mg daily

## 2019-12-11 NOTE — Progress Notes (Signed)
Virtual Visit via Video Note  I connected with Tammy Stevens on 12/11/19 at  2:00 PM EDT by a video enabled telemedicine application and verified that I am speaking with the correct person using two identifiers.   I discussed the limitations of evaluation and management by telemedicine and the availability of in person appointments. The patient expressed understanding and agreed to proceed.  Present for the visit:  Myself, Dr Billey Gosling, [name].  The patient is currently at home and I am in the office.    No referring provider.    History of Present Illness: The patient is here for follow up of their chronic medical problems, including htn, GERD, prediabetes, CKD, hyperlipemia  She also has physical deconditioning due to prolonged URI.  She is having leg weakness.  Very sedentary and is concerned about falling.  Cough:  She still has a bad cough.  She completed the antibiotic.  The cough medication works great.  The cough is mostly dry. On occasion she has sputum.  She has a little wheeze and SOB.  No fever.  She continues to remain abstinent of smoking.  She is hot, shaky, dizzy and not hungry.  She thinks this is from her not eating.  She is just laying down and not doing anything.  She is bipolar and having depression and mania.  Her psychiatrist and her prozac was increased.    She had cataract surgery one month ago and has blurry vision and can not drive.    She drinks boost or ensure.     Review of Systems  Constitutional: Negative for fever.  HENT: Positive for congestion (mild, chronic). Negative for sinus pain and sore throat.   Respiratory: Positive for cough, shortness of breath and wheezing (mild).   Cardiovascular: Negative for chest pain, palpitations and leg swelling.  Neurological: Negative for headaches.      Social History   Socioeconomic History  . Marital status: Divorced    Spouse name: Not on file  . Number of children: 2  . Years of education:  Not on file  . Highest education level: Not on file  Occupational History  . Occupation: sedentary    Employer: [employer]  Tobacco Use  . Smoking status: Former Smoker    Quit date: 09/25/2019    Years since quitting: 0.2  . Smokeless tobacco: Never Used  Vaping Use  . Vaping Use: Never used  Substance and Sexual Activity  . Alcohol use: No    Alcohol/week: 0.0 standard drinks    Comment: Quit 2008  . Drug use: No  . Sexual activity: Not Currently  Other Topics Concern  . Not on file  Social History Narrative  . Not on file   Social Determinants of Health   Financial Resource Strain:   . Difficulty of Paying Living Expenses: Not on file  Food Insecurity:   . Worried About Charity fundraiser in the Last Year: Not on file  . Ran Out of Food in the Last Year: Not on file  Transportation Needs:   . Lack of Transportation (Medical): Not on file  . Lack of Transportation (Non-Medical): Not on file  Physical Activity:   . Days of Exercise per Week: Not on file  . Minutes of Exercise per Session: Not on file  Stress:   . Feeling of Stress : Not on file  Social Connections:   . Frequency of Communication with Friends and Family: Not on file  . Frequency  of Social Gatherings with Friends and Family: Not on file  . Attends Religious Services: Not on file  . Active Member of Clubs or Organizations: Not on file  . Attends Archivist Meetings: Not on file  . Marital Status: Not on file     Observations/Objective: Appears well in NAD Breathing normally Skin appears warm and dry  Assessment and Plan:  See Problem List for Assessment and Plan of chronic medical problems.   Follow Up Instructions:    I discussed the assessment and treatment plan with the patient. The patient was provided an opportunity to ask questions and all were answered. The patient agreed with the plan and demonstrated an understanding of the instructions.   The patient was advised to  call back or seek an in-person evaluation if the symptoms worsen or if the condition fails to improve as anticipated.  Follow-up in 6 months, sooner if needed  [name], MD

## 2019-12-11 NOTE — Assessment & Plan Note (Signed)
Chronic BP Readings from Last 3 Encounters:  06/20/19 (!) 160/82  06/11/19 (!) 152/72  05/02/18 122/70   ?  Blood pressure controlled or not.  Since this is a virtual visit we are not able to evaluate this At this point we will continue amlodipine 5 mg daily, hydrochlorothiazide 12.5 mg daily

## 2019-12-17 NOTE — Telephone Encounter (Signed)
    [facility] calling to report start of care for home PT scheduled for 10/13

## 2019-12-18 ENCOUNTER — Telehealth: Payer: Self-pay | Admitting: Internal Medicine

## 2019-12-18 NOTE — Telephone Encounter (Signed)
Patient is declining Home Health Physical Therapy because of the cost and patient states they are too weak for PT at this time  Tammy Stevens at [facility] call with any questions [phone]

## 2019-12-26 DIAGNOSIS — F4323 Adjustment disorder with mixed anxiety and depressed mood: Secondary | ICD-10-CM | POA: Diagnosis not present

## 2019-12-26 DIAGNOSIS — F3181 Bipolar II disorder: Secondary | ICD-10-CM | POA: Diagnosis not present

## 2019-12-27 DIAGNOSIS — H04123 Dry eye syndrome of bilateral lacrimal glands: Secondary | ICD-10-CM | POA: Diagnosis not present

## 2019-12-27 DIAGNOSIS — H5051 Esophoria: Secondary | ICD-10-CM | POA: Diagnosis not present

## 2019-12-27 DIAGNOSIS — Z961 Presence of intraocular lens: Secondary | ICD-10-CM | POA: Diagnosis not present
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
## 2019-12-30 ENCOUNTER — Ambulatory Visit: Payer: Medicare HMO | Admitting: Internal Medicine

## 2019-12-30 ENCOUNTER — Encounter: Payer: Self-pay | Admitting: Internal Medicine

## 2019-12-30 ENCOUNTER — Other Ambulatory Visit: Payer: Self-pay

## 2019-12-30 VITALS — BP 152/88 | HR 82 | Ht 66.0 in | Wt 141.8 lb

## 2019-12-30 DIAGNOSIS — R0602 Shortness of breath: Secondary | ICD-10-CM

## 2019-12-30 NOTE — Patient Instructions (Signed)
Medication Instructions:  No changes *If you need a refill on your cardiac medications before your next appointment, please call your pharmacy*   Lab Work: none If you have labs (blood work) drawn today and your tests are completely normal, you will receive your results only by: Marland Kitchen MyChart Message (if you have MyChart) OR . A paper copy in the mail If you have any lab test that is abnormal or we need to change your treatment, we will call you to review the results.   Testing/Procedures: Your physician has requested that you have an echocardiogram. Echocardiography is a painless test that uses sound waves to create images of your heart. It provides your doctor with information about the size and shape of your heart and how well your heart's chambers and valves are working. This procedure takes approximately one hour. There are no restrictions for this procedure.   Follow-Up: Follow up with your physician will depend on test results.  Other Instructions

## 2020-01-01 ENCOUNTER — Ambulatory Visit: Payer: Medicare HMO | Admitting: Internal Medicine
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
## 2020-01-03 ENCOUNTER — Ambulatory Visit (INDEPENDENT_AMBULATORY_CARE_PROVIDER_SITE_OTHER): Payer: Medicare HMO

## 2020-01-03 ENCOUNTER — Other Ambulatory Visit: Payer: Self-pay

## 2020-01-03 ENCOUNTER — Ambulatory Visit (INDEPENDENT_AMBULATORY_CARE_PROVIDER_SITE_OTHER): Payer: Medicare HMO | Admitting: Internal Medicine

## 2020-01-03 ENCOUNTER — Encounter: Payer: Self-pay | Admitting: Internal Medicine

## 2020-01-03 ENCOUNTER — Ambulatory Visit: Payer: Medicare HMO | Admitting: Internal Medicine

## 2020-01-03 VITALS — BP 140/82 | HR 75 | Temp 98.4°F | Ht 66.0 in | Wt 143.0 lb

## 2020-01-03 DIAGNOSIS — E7849 Other hyperlipidemia: Secondary | ICD-10-CM | POA: Diagnosis not present

## 2020-01-03 DIAGNOSIS — G20 Parkinson's disease: Secondary | ICD-10-CM | POA: Diagnosis not present

## 2020-01-03 DIAGNOSIS — R7303 Prediabetes: Secondary | ICD-10-CM | POA: Diagnosis not present

## 2020-01-03 DIAGNOSIS — R0602 Shortness of breath: Secondary | ICD-10-CM

## 2020-01-03 DIAGNOSIS — G20C Parkinsonism, unspecified: Secondary | ICD-10-CM

## 2020-01-03 DIAGNOSIS — R059 Cough, unspecified: Secondary | ICD-10-CM | POA: Diagnosis not present

## 2020-01-03 DIAGNOSIS — F3160 Bipolar disorder, current episode mixed, unspecified: Secondary | ICD-10-CM

## 2020-01-03 DIAGNOSIS — I10 Essential (primary) hypertension: Secondary | ICD-10-CM

## 2020-01-03 DIAGNOSIS — R5381 Other malaise: Secondary | ICD-10-CM
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
## 2020-01-03 NOTE — Patient Instructions (Addendum)
Stop the advair for now. Stop crestor.     Blood work was ordered.  Have a chest xray today downstairs.     A referral was ordered for Pulmonary and Neurology.       Someone from their office will call you to schedule an appointment.   A referral was ordered for PT.

## 2020-01-04 DIAGNOSIS — R5381 Other malaise: Secondary | ICD-10-CM | POA: Insufficient documentation

## 2020-01-04 DIAGNOSIS — G20 Parkinson's disease: Secondary | ICD-10-CM | POA: Insufficient documentation

## 2020-01-04 NOTE — Assessment & Plan Note (Signed)
Subacute Persistent - primarily dry Has been treated with abx and steroids in the past couple of months Cough has improved Has DOE that is concerning Has just recently stopped smoking cxr today Refer to pulm

## 2020-01-04 NOTE — Assessment & Plan Note (Signed)
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 Advised the patient to call her psychiatrist as well Stop advair since that seems to make her shaky ? Related to her psych meds Referred to neuro

## 2020-01-04 NOTE — Assessment & Plan Note (Signed)
Acute Significant - has difficulty doing ADLs Referred to Home health PT

## 2020-01-04 NOTE — Assessment & Plan Note (Addendum)
Acute on chronic Just recently stopped smoking Has persistent cough  advair helped cough, but not SOB - will hold since it makes her shaky and currently has parkinsonism features.  May need to try a different inhaler but will have pulm evaluate first ? Related to COPD or cardiac cause.  Deconditioning is likely contributing but unlikely the only cause cxr today Refer to [facility] PT Has seen cardio - echo was ordered

## 2020-01-04 NOTE — Assessment & Plan Note (Signed)
Chronic Currently manic Following with psychiatry - she will follow up with her regarding her mania and parkinsonism

## 2020-01-04 NOTE — Assessment & Plan Note (Signed)
Chronic Check a1c 

## 2020-01-04 NOTE — Assessment & Plan Note (Signed)
Chronic Will hold crestor for now - her and her daughter want to eliminate as much as possible at this time which is ok - will restart at a later time

## 2020-01-04 NOTE — Assessment & Plan Note (Addendum)
Chronic BP well controlled Continue amlodipine 5 mg daily, hctz 12.5 mg  cmp

## 2020-01-06 ENCOUNTER — Encounter: Payer: Self-pay | Admitting: Internal Medicine

## 2020-01-06 DIAGNOSIS — I700 Atherosclerosis of aorta: Secondary | ICD-10-CM | POA: Insufficient documentation

## 2020-01-07 ENCOUNTER — Encounter: Payer: Self-pay | Admitting: Internal Medicine

## 2020-01-08 ENCOUNTER — Telehealth: Payer: Self-pay | Admitting: Pharmacist

## 2020-01-08 ENCOUNTER — Encounter: Payer: Self-pay | Admitting: Internal Medicine

## 2020-01-10 ENCOUNTER — Other Ambulatory Visit: Payer: Self-pay

## 2020-01-10 ENCOUNTER — Ambulatory Visit (INDEPENDENT_AMBULATORY_CARE_PROVIDER_SITE_OTHER): Payer: Medicare HMO

## 2020-01-10 DIAGNOSIS — Z0000 Encounter for general adult medical examination without abnormal findings: Secondary | ICD-10-CM
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
## 2020-01-10 NOTE — Patient Instructions (Addendum)
Tammy Stevens , Thank you for taking time to come for your Medicare Wellness Visit. I appreciate your ongoing commitment to your health goals. Please review the following plan we discussed and let me know if I can assist you in the future.   Screening recommendations/referrals: Colonoscopy: 05/14/2014; due every 5 years (patient received letter to schedule) Mammogram: 09/10/2013 (patient received letter to schedule) Bone Density: 07/06/2012; due every 2 years Recommended yearly ophthalmology/optometry visit for glaucoma screening and checkup Recommended yearly dental visit for hygiene and checkup  Vaccinations: Influenza vaccine: 11/19/2018 (will check with local pharmacy) Pneumococcal vaccine: up to date Tdap vaccine: 11/10/2009; overdue Shingles vaccine: up to date Covid-19: up to date  Advanced directives: Please bring a copy of your health care power of attorney and living will to the office at your convenience.  Conditions/risks identified: Yes; Reviewed health maintenance screenings with patient today and relevant education, vaccines, and/or referrals were provided. Please continue to do your personal lifestyle choices by: daily care of teeth and gums, regular physical activity (goal should be 5 days a week for 30 minutes), eat a healthy diet, avoid tobacco and drug use, limiting any alcohol intake, taking a low-dose aspirin (if not allergic or have been advised by your provider otherwise) and taking vitamins and minerals as recommended by your provider. Continue doing brain stimulating activities (puzzles, reading, adult coloring books, staying active) to keep memory sharp. Continue to eat heart healthy diet (full of fruits, vegetables, whole grains, lean protein, water--limit salt, fat, and sugar intake) and increase physical activity as tolerated.  Next appointment: Please schedule your next Medicare Wellness Visit with your Nurse Health Advisor in 1 year by calling [phone].   Preventive  Care 73 Years and Older, Female Preventive care refers to lifestyle choices and visits with your health care provider that can promote health and wellness. What does preventive care include?  A yearly physical exam. This is also called an annual well check.  Dental exams once or twice a year.  Routine eye exams. Ask your health care provider how often you should have your eyes checked.  Personal lifestyle choices, including:  Daily care of your teeth and gums.  Regular physical activity.  Eating a healthy diet.  Avoiding tobacco and drug use.  Limiting alcohol use.  Practicing safe sex.  Taking low-dose aspirin every day.  Taking vitamin and mineral supplements as recommended by your health care provider. What happens during an annual well check? The services and screenings done by your health care provider during your annual well check will depend on your age, overall health, lifestyle risk factors, and family history of disease. Counseling  Your health care provider may ask you questions about your:  Alcohol use.  Tobacco use.  Drug use.  Emotional well-being.  Home and relationship well-being.  Sexual activity.  Eating habits.  History of falls.  Memory and ability to understand (cognition).  Work and work Statistician.  Reproductive health. Screening  You may have the following tests or measurements:  Height, weight, and BMI.  Blood pressure.  Lipid and cholesterol levels. These may be checked every 5 years, or more frequently if you are over 73 years old.  Skin check.  Lung cancer screening. You may have this screening every year starting at age 73 if you have a 30-pack-year history of smoking and currently smoke or have quit within the past 15 years.  Fecal occult blood test (FOBT) of the stool. You may have this test every year starting  at age 73.  Flexible sigmoidoscopy or colonoscopy. You may have a sigmoidoscopy every 5 years or a  colonoscopy every 10 years starting at age 73.  Hepatitis C blood test.  Hepatitis B blood test.  Sexually transmitted disease (STD) testing.  Diabetes screening. This is done by checking your blood sugar (glucose) after you have not eaten for a while (fasting). You may have this done every 1-3 years.  Bone density scan. This is done to screen for osteoporosis. You may have this done starting at age 73.  Mammogram. This may be done every 1-2 years. Talk to your health care provider about how often you should have regular mammograms. Talk with your health care provider about your test results, treatment options, and if necessary, the need for more tests. Vaccines  Your health care provider may recommend certain vaccines, such as:  Influenza vaccine. This is recommended every year.  Tetanus, diphtheria, and acellular pertussis (Tdap, Td) vaccine. You may need a Td booster every 10 years.  Zoster vaccine. You may need this after age 73.  Pneumococcal 13-valent conjugate (PCV13) vaccine. One dose is recommended after age 73.  Pneumococcal polysaccharide (PPSV23) vaccine. One dose is recommended after age 73. Talk to your health care provider about which screenings and vaccines you need and how often you need them. This information is not intended to replace advice given to you by your health care provider. Make sure you discuss any questions you have with your health care provider. Document Released: 03/20/2015 Document Revised: 11/11/2015 Document Reviewed: 12/23/2014 Elsevier Interactive Patient Education  2017 [employer] Prevention in the Home Falls can cause injuries. They can happen to people of all ages. There are many things you can do to make your home safe and to help prevent falls. What can I do on the outside of my home?  Regularly fix the edges of walkways and driveways and fix any cracks.  Remove anything that might make you trip as you walk through a door,  such as a raised step or threshold.  Trim any bushes or trees on the path to your home.  Use bright outdoor lighting.  Clear any walking paths of anything that might make someone trip, such as rocks or tools.  Regularly check to see if handrails are loose or broken. Make sure that both sides of any steps have handrails.  Any raised decks and porches should have guardrails on the edges.  Have any leaves, snow, or ice cleared regularly.  Use sand or salt on walking paths during winter.  Clean up any spills in your garage right away. This includes oil or grease spills. What can I do in the bathroom?  Use night lights.  Install grab bars by the toilet and in the tub and shower. Do not use towel bars as grab bars.  Use non-skid mats or decals in the tub or shower.  If you need to sit down in the shower, use a plastic, non-slip stool.  Keep the floor dry. Clean up any water that spills on the floor as soon as it happens.  Remove soap buildup in the tub or shower regularly.  Attach bath mats securely with double-sided non-slip rug tape.  Do not have throw rugs and other things on the floor that can make you trip. What can I do in the bedroom?  Use night lights.  Make sure that you have a light by your bed that is easy to reach.  Do not use  any sheets or blankets that are too big for your bed. They should not hang down onto the floor.  Have a firm chair that has side arms. You can use this for support while you get dressed.  Do not have throw rugs and other things on the floor that can make you trip. What can I do in the kitchen?  Clean up any spills right away.  Avoid walking on wet floors.  Keep items that you use a lot in easy-to-reach places.  If you need to reach something above you, use a strong step stool that has a grab bar.  Keep electrical cords out of the way.  Do not use floor polish or wax that makes floors slippery. If you must use wax, use non-skid  floor wax.  Do not have throw rugs and other things on the floor that can make you trip. What can I do with my stairs?  Do not leave any items on the stairs.  Make sure that there are handrails on both sides of the stairs and use them. Fix handrails that are broken or loose. Make sure that handrails are as long as the stairways.  Check any carpeting to make sure that it is firmly attached to the stairs. Fix any carpet that is loose or worn.  Avoid having throw rugs at the top or bottom of the stairs. If you do have throw rugs, attach them to the floor with carpet tape.  Make sure that you have a light switch at the top of the stairs and the bottom of the stairs. If you do not have them, ask someone to add them for you. What else can I do to help prevent falls?  Wear shoes that:  Do not have high heels.  Have rubber bottoms.  Are comfortable and fit you well.  Are closed at the toe. Do not wear sandals.  If you use a stepladder:  Make sure that it is fully opened. Do not climb a closed stepladder.  Make sure that both sides of the stepladder are locked into place.  Ask someone to hold it for you, if possible.  Clearly mark and make sure that you can see:  Any grab bars or handrails.  First and last steps.  Where the edge of each step is.  Use tools that help you move around (mobility aids) if they are needed. These include:  Canes.  Walkers.  Scooters.  Crutches.  Turn on the lights when you go into a dark area. Replace any light bulbs as soon as they burn out.  Set up your furniture so you have a clear path. Avoid moving your furniture around.  If any of your floors are uneven, fix them.  If there are any pets around you, be aware of where they are.  Review your medicines with your doctor. Some medicines can make you feel dizzy. This can increase your chance of falling. Ask your doctor what other things that you can do to help prevent falls. This  information is not intended to replace advice given to you by your health care provider. Make sure you discuss any questions you have with your health care provider. Document Released: 12/18/2008 Document Revised: 07/30/2015 Document Reviewed: 03/28/2014 Elsevier Interactive Patient Education  2017 [employer].

## 2020-01-10 NOTE — Progress Notes (Addendum)
Opened in error

## 2020-01-14 ENCOUNTER — Telehealth: Payer: Self-pay | Admitting: Pharmacist

## 2020-01-14 NOTE — Progress Notes (Signed)
    Chronic Care Management Pharmacy Assistant   Name: Tammy Stevens  MRN: [id number] DOB: 1945-12-11  Reason for Encounter: General Adherence Call  Patient Questions:  1.  Have you seen any other providers since your last visit? Patient saw Dr. Quay Burow on 12/11/19 and Dr. Dorris Carnes on 12/30/19   2.  Any changes in your medicines or health? Dr Quay Burow added fluticasone-salmeterol 100-50 mcg and Dr. Harrington Challenger increased bupropion Hcl 300 mg    PCP : [name], MD  Allergies:  No Known Allergies  Medications: Outpatient Encounter Medications as of 01/14/2020  Medication Sig  . amLODipine (NORVASC) 5 MG tablet TAKE 1 TABLET BY MOUTH EVERY DAY  . aspirin EC 81 MG tablet Take 81 mg by mouth daily.  Marland Kitchen buPROPion (WELLBUTRIN XL) 300 MG 24 hr tablet Take 1 tablet by mouth daily.   . diazepam (VALIUM) 5 MG tablet Take 1 tablet (5 mg total) by mouth every 12 (twelve) hours as needed for anxiety.  Marland Kitchen FLUoxetine (PROZAC) 40 MG capsule daily.  . fluticasone (FLONASE) 50 MCG/ACT nasal spray Place 2 sprays into both nostrils daily. (Patient taking differently: Place 2 sprays into both nostrils as needed. )  . Fluticasone-Salmeterol (ADVAIR) 100-50 MCG/DOSE AEPB Inhale 1 puff into the lungs 2 (two) times daily.  Marland Kitchen gabapentin (NEURONTIN) 300 MG capsule Take 300 mg by mouth 2 (two) times daily.   . hydrochlorothiazide (MICROZIDE) 12.5 MG capsule TAKE 1 CAPSULE BY MOUTH EVERY DAY  . lamoTRIgine (LAMICTAL) 200 MG tablet Take 200 mg by mouth 2 (two) times daily.   Marland Kitchen loratadine (CLARITIN) 10 MG tablet Take 10 mg by mouth as needed for allergies.  . Multiple Vitamin (MULTIVITAMIN) tablet Take 1 tablet by mouth daily.  . rosuvastatin (CRESTOR) 10 MG tablet Take 1 tablet (10 mg total) by mouth daily.   No facility-administered encounter medications on file as of 01/14/2020.    Current Diagnosis: Patient Active Problem List   Diagnosis Date Noted  . Atherosclerosis of aorta ([place]) 01/06/2020  . Physical  deconditioning 01/04/2020  . Parkinsonism ([place]) 01/04/2020  . Cough 11/01/2019  . Pre-syncope 06/21/2019  . Allergic rhinitis 06/11/2019  . 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 Episodic lightheadedness 06/11/2019  . Sinus infection 03/12/2019  . CKD (chronic kidney disease) stage 3, GFR 30-59 ml/min (HCC) 07/22/2018  . Prediabetes 10/11/2017  . Tingling 10/11/2017  . Palpitations 07/28/2017  . SOB (shortness of breath) on exertion 07/28/2017  . Diastolic dysfunction 63/78/5885  . Memory difficulties 09/23/2015  . Skin cancer 09/23/2015  . Bipolar disorder ([place]) 02/09/2015  . Headache around the eyes 04/17/2014  . Cystocele 06/24/2013  . Smokers' cough ([place]) 09/07/2010  . FATTY LIVER DISEASE 02/18/2010  . CHOLELITHIASIS, ASYMPTOMATIC 02/18/2010  . 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 IRRITABLE BOWEL SYNDROME 02/16/2010  . GERD 02/11/2010  . Hyperlipidemia 12/22/2009  . OSA (obstructive sleep apnea) 12/22/2009  . Depression 11/24/2009  . Essential hypertension 11/24/2009    Goals Addressed   None     Follow-Up:  Pharmacist Review   Spoke with patient who states that she is doing well and is not having any issues with her health at this time. There is some medication changes that she want to be made but would like to discuss that with Dr. Quay Burow at this time .

## 2020-01-15 ENCOUNTER — Telehealth: Payer: Self-pay

## 2020-01-15 NOTE — Telephone Encounter (Signed)
Called - left message

## 2020-01-20 ENCOUNTER — Encounter: Payer: Self-pay | Admitting: Internal Medicine

## 2020-01-20 ENCOUNTER — Other Ambulatory Visit (HOSPITAL_COMMUNITY): Payer: Medicare HMO

## 2020-01-20 DIAGNOSIS — F1021 Alcohol dependence, in remission: Secondary | ICD-10-CM | POA: Diagnosis not present

## 2020-01-20 DIAGNOSIS — F3181 Bipolar II disorder: Secondary | ICD-10-CM | POA: Diagnosis not present

## 2020-01-20 NOTE — Telephone Encounter (Signed)
Tammy Stevens from [facility] called   She spoke to the patient today and the patient was complaining of movement issues. Tammy Stevens suspects that the movement issues are a result of changes in her medication. The patient would like her to talk to Dr. Quay Burow before taking further steps in treatments.

## 2020-01-21 DIAGNOSIS — F3181 Bipolar II disorder: Secondary | ICD-10-CM | POA: Diagnosis not present

## 2020-01-21 DIAGNOSIS — F4323 Adjustment disorder with mixed anxiety and depressed mood: Secondary | ICD-10-CM | POA: Diagnosis not present

## 2020-01-21 MED ORDER — FLUTICASONE PROPIONATE 50 MCG/ACT NA SUSP: 2.0000 | Freq: Every day | NASAL | 6 refills | Status: DC

## 2020-01-21 NOTE — Addendum Note (Signed)
Addended by: [name] on: 01/21/2020 12:07 PM   Modules accepted: Orders

## 2020-01-21 NOTE — Telephone Encounter (Signed)
Spoke to lisa -- movement issues resolved with stopping advair -- not related to any psych meds.     Ok to set up an appt with Onya if she wants to f/u with me

## 2020-01-22 ENCOUNTER — Other Ambulatory Visit: Payer: Self-pay | Admitting: Internal Medicine

## 2020-01-22 ENCOUNTER — Telehealth: Payer: Self-pay | Admitting: Internal Medicine

## 2020-01-22 DIAGNOSIS — I1310 Hypertensive heart and chronic kidney disease without heart failure, with stage 1 through stage 4 chronic kidney disease, or unspecified chronic kidney disease: Secondary | ICD-10-CM | POA: Diagnosis not present

## 2020-01-22 DIAGNOSIS — G20 Parkinson's disease: Secondary | ICD-10-CM | POA: Diagnosis not present

## 2020-01-22 DIAGNOSIS — R5381 Other malaise: Secondary | ICD-10-CM | POA: Diagnosis not present

## 2020-01-22 DIAGNOSIS — G4733 Obstructive sleep apnea (adult) (pediatric): Secondary | ICD-10-CM | POA: Diagnosis not present

## 2020-01-22 DIAGNOSIS — K760 Fatty (change of) liver, not elsewhere classified: Secondary | ICD-10-CM | POA: Diagnosis not present

## 2020-01-22 DIAGNOSIS — I5189 Other ill-defined heart diseases: Secondary | ICD-10-CM | POA: Diagnosis not present

## 2020-01-22 DIAGNOSIS — R059 Cough, unspecified: Secondary | ICD-10-CM | POA: Diagnosis not present

## 2020-01-22 DIAGNOSIS — F3110 Bipolar disorder, current episode manic without psychotic features, unspecified: Secondary | ICD-10-CM | POA: Diagnosis not present

## 2020-01-22 DIAGNOSIS — N1831 Chronic kidney disease, stage 3a: Secondary | ICD-10-CM | POA: Diagnosis not present

## 2020-01-22 NOTE — Telephone Encounter (Signed)
ok 

## 2020-01-22 NOTE — Telephone Encounter (Signed)
Verbal orders requested  Caller Name: [place] Name: [place] Phone #: [phone] Service Requested: PT Frequency of Visits:   -[place]

## 2020-01-22 NOTE — Telephone Encounter (Signed)
Called and left message for patient to return call to office if she would like to schedule follow up with Dr. Quay Burow.

## 2020-01-22 NOTE — Telephone Encounter (Signed)
Verbals given  

## 2020-01-28 ENCOUNTER — Ambulatory Visit: Payer: Medicare HMO | Admitting: Internal Medicine

## 2020-01-28 ENCOUNTER — Encounter: Payer: Self-pay | Admitting: Internal Medicine

## 2020-02-03 ENCOUNTER — Telehealth: Payer: Self-pay | Admitting: Internal Medicine

## 2020-02-03 NOTE — Telephone Encounter (Signed)
She is currently following with psychiatry and needs to start getting this from her psychiatrist since she is treating her bipolar.

## 2020-02-03 NOTE — Telephone Encounter (Signed)
Spoke with patient.

## 2020-02-03 NOTE — Telephone Encounter (Signed)
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 Recently had a maniac attack and would like to know if Diazepam could be called in since she is unable to sleep. Called patient to set up an appointment with PCP. LVM to call the office.

## 2020-02-04 ENCOUNTER — Encounter: Payer: Self-pay | Admitting: Internal Medicine

## 2020-02-05 ENCOUNTER — Ambulatory Visit: Payer: Medicare HMO | Admitting: Internal Medicine

## 2020-02-05 ENCOUNTER — Institutional Professional Consult (permissible substitution): Payer: Medicare HMO | Admitting: Pulmonary Disease

## 2020-02-05 DIAGNOSIS — I1310 Hypertensive heart and chronic kidney disease without heart failure, with stage 1 through stage 4 chronic kidney disease, or unspecified chronic kidney disease: Secondary | ICD-10-CM | POA: Diagnosis not present

## 2020-02-05 DIAGNOSIS — G4733 Obstructive sleep apnea (adult) (pediatric): Secondary | ICD-10-CM | POA: Diagnosis not present

## 2020-02-05 DIAGNOSIS — R059 Cough, unspecified: Secondary | ICD-10-CM | POA: Diagnosis not present

## 2020-02-05 DIAGNOSIS — F3110 Bipolar disorder, current episode manic without psychotic features, unspecified: Secondary | ICD-10-CM | POA: Diagnosis not present

## 2020-02-05 DIAGNOSIS — R5381 Other malaise: Secondary | ICD-10-CM | POA: Diagnosis not present

## 2020-02-05 DIAGNOSIS — G20 Parkinson's disease: Secondary | ICD-10-CM | POA: Diagnosis not present

## 2020-02-05 DIAGNOSIS — N1831 Chronic kidney disease, stage 3a: Secondary | ICD-10-CM | POA: Diagnosis not present

## 2020-02-05 DIAGNOSIS — K760 Fatty (change of) liver, not elsewhere classified: Secondary | ICD-10-CM | POA: Diagnosis not present

## 2020-02-05 DIAGNOSIS — I5189 Other ill-defined heart diseases: Secondary | ICD-10-CM | POA: Diagnosis not present

## 2020-02-06 ENCOUNTER — Encounter: Payer: Self-pay | Admitting: Internal Medicine

## 2020-02-06 ENCOUNTER — Telehealth: Payer: Self-pay | Admitting: Internal Medicine

## 2020-02-06 NOTE — Telephone Encounter (Signed)
Patients daughter called and is requesting a phone conversation with Dr. Quay Burow in regards to her mother's health. She can be reached at [phone]

## 2020-02-07 ENCOUNTER — Encounter (HOSPITAL_COMMUNITY): Payer: Self-pay | Admitting: Internal Medicine

## 2020-02-07 DIAGNOSIS — N1831 Chronic kidney disease, stage 3a: Secondary | ICD-10-CM | POA: Diagnosis not present

## 2020-02-07 DIAGNOSIS — R059 Cough, unspecified: Secondary | ICD-10-CM | POA: Diagnosis not present

## 2020-02-07 DIAGNOSIS — I1310 Hypertensive heart and chronic kidney disease without heart failure, with stage 1 through stage 4 chronic kidney disease, or unspecified chronic kidney disease: Secondary | ICD-10-CM | POA: Diagnosis not present

## 2020-02-07 DIAGNOSIS — F3110 Bipolar disorder, current episode manic without psychotic features, unspecified: Secondary | ICD-10-CM | POA: Diagnosis not present

## 2020-02-07 DIAGNOSIS — I5189 Other ill-defined heart diseases: Secondary | ICD-10-CM | POA: Diagnosis not present

## 2020-02-07 DIAGNOSIS — R5381 Other malaise: Secondary | ICD-10-CM | POA: Diagnosis not present

## 2020-02-07 DIAGNOSIS — G4733 Obstructive sleep apnea (adult) (pediatric): Secondary | ICD-10-CM | POA: Diagnosis not present

## 2020-02-07 DIAGNOSIS — G20 Parkinson's disease: Secondary | ICD-10-CM | POA: Diagnosis not present

## 2020-02-07 DIAGNOSIS — K760 Fatty (change of) liver, not elsewhere classified: Secondary | ICD-10-CM | POA: Diagnosis not present

## 2020-02-07 NOTE — Telephone Encounter (Signed)
Since Sept Tammy Stevens has been in a decline.  She showed up at Thanksgiving and was not able to stay - she was manic and had a cough.   ? Slow decline.  ? Beginning of the end.  Her daughter will be talking to her psychiatrist since she is currently manic.  She feels that a lot of her symptoms that she is experiencing now started in September when she was put on Prozac.  She still has a cough-did have an appointment with pulmonary, but this had to be rescheduled so she will see them.  She does have an appointment with neurology next month.  Her daughter will keep me informed

## 2020-02-14 ENCOUNTER — Telehealth: Payer: Self-pay | Admitting: Internal Medicine

## 2020-02-14 ENCOUNTER — Ambulatory Visit (HOSPITAL_COMMUNITY)
Admission: EM | Admit: 2020-02-14 | Discharge: 2020-02-14 | Disposition: A | Discharge status: Home / Self Care | Payer: Medicare HMO | Attending: Psychiatry | Admitting: Psychiatry

## 2020-02-14 ENCOUNTER — Other Ambulatory Visit: Payer: Self-pay

## 2020-02-14 DIAGNOSIS — Z79899 Other long term (current) drug therapy: Secondary | ICD-10-CM | POA: Insufficient documentation

## 2020-02-14 DIAGNOSIS — F1011 Alcohol abuse, in remission: Secondary | ICD-10-CM | POA: Insufficient documentation

## 2020-02-14 DIAGNOSIS — Z634 Disappearance and death of family member: Secondary | ICD-10-CM | POA: Insufficient documentation

## 2020-02-14 DIAGNOSIS — F3110 Bipolar disorder, current episode manic without psychotic features, unspecified: Secondary | ICD-10-CM | POA: Insufficient documentation

## 2020-02-14 MED ORDER — LORAZEPAM 1 MG PO TABS
ORAL_TABLET | ORAL | Status: AC
  Filled 2020-02-14: qty 2

## 2020-02-14 MED ORDER — HALOPERIDOL 5 MG PO TABS
5.0000 mg | ORAL_TABLET | Freq: Once | ORAL | Status: AC
  Administered 2020-02-14: 5 mg via ORAL

## 2020-02-14 MED ORDER — LORAZEPAM 1 MG PO TABS
2.0000 mg | ORAL_TABLET | Freq: Once | ORAL | Status: AC
  Administered 2020-02-14: 2 mg via ORAL

## 2020-02-14 MED ORDER — HALOPERIDOL 5 MG PO TABS
ORAL_TABLET | ORAL | Status: AC
  Filled 2020-02-14: qty 1

## 2020-02-14 MED ORDER — HALOPERIDOL 5 MG PO TABS: 5.0000 mg | ORAL_TABLET | Freq: Every evening | ORAL | 0 refills | Status: DC | PRN

## 2020-02-14 NOTE — ED Triage Notes (Signed)
Patient to be seen by provider - urgent need.

## 2020-02-14 NOTE — Telephone Encounter (Signed)
This medication would have to come from her psychiatrist

## 2020-02-14 NOTE — Discharge Instructions (Addendum)
Stop using Prozac Patient is instructed prior to discharge to: Take all medications as prescribed by his/her mental healthcare provider. Report any adverse effects and or reactions from the medicines to his/her outpatient provider promptly. Patient has been instructed & cautioned: To not engage in alcohol and or illegal drug use while on prescription medicines. In the event of worsening symptoms, patient is instructed to call the crisis hotline, 911 and or go to the nearest ED for appropriate evaluation and treatment of symptoms. To follow-up with his/her primary care provider for your other medical issues, concerns and or health care needs.

## 2020-02-14 NOTE — Telephone Encounter (Signed)
Patient called and said that she has been in a manic state for a few days and was wondering if Dr. Quay Burow could call diazepam (VALIUM) 5 MG tablet  In for her. It can be sent to [id number] - [place], [place] [address] AT [address]

## 2020-02-14 NOTE — ED Provider Notes (Signed)
[facility] Medical Screening Exam  Patient Name: Tammy Stevens MRN: [id number] Date of Evaluation: 02/14/20 Chief Complaint:   Diagnosis:  Final diagnoses:  Bipolar affective disorder, current episode manic, current episode severity unspecified ([place])    History of Present illness: Tammy Stevens is a 74 y.o. female.  Patient presents as a walk-in voluntarily with her daughter.  They report that patient has had worsening symptoms of mania since November 28, 2022.  She states she has a diagnosis of bipolar disorder as well as a history of alcohol abuse but has been in remission for more than 10 years.  They state that they had a family member died in 11/28/2022 and the patient was done with depression and was started on Prozac by her outpatient psychiatrist at [facility].  They report that she is currently on Prozac 40 mg p.o. daily, Neurontin 300 mg p.o. 3 times daily, Wellbutrin XL 300 mg p.o. daily, and Lamictal 200 mg p.o. twice daily.  They stated that since she started the Prozac she had started having worsening symptoms.  She states that she has been on the other medications for years.  Patient presents today with pressured speech and inability to focus, and reported poor sleep.  She does have some tremors but this is been reported to be baseline but worsens during manic episodes.  Patient's daughter reports that she has a follow-up appointment scheduled with neurology next month.  States that right now they are currently upset with her outpatient psychiatrist because it does not seem that she is listening to them.  She states that they have been trying to get her evaluated because she has had a prescription for it in the past but nobody will prescribe it to her.  She denies having any suicidal or homicidal ideations and denies any hallucinations.  After discussing with Dr. Serafina Mitchell will give patient a one-time dose of Haldol 5 mg p.o. and Ativan 2 mg p.o.  I also instructed them to  discontinue use of the Prozac.  He was provided with resources for outpatient providers and also given a referral for the [facility] program.  Patient was also provided with a 7-day prescription of Haldol 5 mg p.o. nightly to allow for time for the Prozac to get out of her system.  Patient's daughter stated that she can stay with the patient for the next couple of days and to make sure that she was coming down.  She had no safety concerns with the patient going home because the patient has not made any threats to herself or towards anyone else and just want to make sure that she was able to get some sleep and calm down.  Psychiatric Specialty Exam  Presentation  General Appearance:Appropriate for Environment; Casual; Disheveled  Eye Contact:Good  Speech:Pressured; Clear and Coherent  Speech Volume:Increased  Handedness:Right   Mood and Affect  Mood:Anxious  Affect:Appropriate; Congruent   Thought Process  Thought Processes:Coherent  Descriptions of Associations:Intact  Orientation:Full (Time, Place and Person)  Thought Content:Tangential  Hallucinations:None  Ideas of Reference:None  Suicidal Thoughts:No  Homicidal Thoughts:No   Sensorium  Memory:Immediate Fair; Recent Fair; Remote Fair  Judgment:Fair  Insight:Good   Executive Functions  Concentration:Fair  Attention Span:Fair  [place]   Psychomotor Activity  Psychomotor Activity:Increased; Restlessness; Tremor; Other (comment) (Reports tremor as baseliine)   Assets  Assets:No data recorded  Sleep  Sleep:Poor  Number of hours: No data recorded  Physical Exam: Physical Exam Vitals and  nursing note reviewed.  Constitutional:      Appearance: She is well-developed.  HENT:     Head: Normocephalic.  Eyes:     Pupils: Pupils are equal, round, and reactive to light.  Cardiovascular:     Rate and Rhythm: Normal rate.  Pulmonary:     Effort: Pulmonary  effort is normal.  Musculoskeletal:        General: Normal range of motion.  Neurological:     Mental Status: She is alert and oriented to person, place, and time.    Review of Systems  Constitutional: Negative.   HENT: Negative.   Eyes: Negative.   Respiratory: Negative.   Cardiovascular: Negative.   Gastrointestinal: Negative.   Genitourinary: Negative.   Musculoskeletal: Negative.   Skin: Negative.   Neurological: Negative.   Endo/Heme/Allergies: Negative.   Psychiatric/Behavioral: The patient is nervous/anxious and has insomnia.    Blood pressure 130/64, pulse 65, temperature 97.7 F (36.5 C), temperature source Temporal, resp. rate 18, height 5\' 6"  (1.676 m), weight 148 lb (67.1 kg), SpO2 100 %. Body mass index is 23.89 kg/m.  Musculoskeletal: Strength & Muscle Tone: within normal limits Gait & Station: unsteady Patient leans: N/A   [facility] MSE Discharge Disposition for Follow up and Recommendations: Based on my evaluation the patient does not appear to have an emergency medical condition and can be discharged with resources and follow up care in outpatient services for Medication Management, Partial Hospitalization Program and [place], FNP 02/14/2020, 10:22 AM

## 2020-02-14 NOTE — Telephone Encounter (Signed)
Called and left message for patient today that she would need to contact psychiatrist.

## 2020-02-14 NOTE — Progress Notes (Signed)
Tammy Stevens was seen by the provider, received a one time dose of medication per order. She received her AVS and their questions were answered.

## 2020-02-17 ENCOUNTER — Telehealth (HOSPITAL_COMMUNITY): Payer: Self-pay | Admitting: Licensed Clinical Social Worker

## 2020-02-17 ENCOUNTER — Telehealth (HOSPITAL_COMMUNITY): Payer: Self-pay | Admitting: Internal Medicine

## 2020-02-17 DIAGNOSIS — F3181 Bipolar II disorder: Secondary | ICD-10-CM | POA: Diagnosis not present

## 2020-02-17 NOTE — Telephone Encounter (Signed)
Just an FYI. We have made several attempts to contact this patient including sending a letter to schedule or reschedule their echocardiogram. We will be removing the patient from the echo [facility].    02/07/20 MAILED LETTER [facility]  02/04/20 [facility] to schedule @ 11:04/[facility]  01/27/20 [facility] to schedule @ 10:31/[facility]  01/23/20 [facility] to reschedule @ 10:30/[facility]   Thank you

## 2020-02-21 ENCOUNTER — Telehealth: Payer: Self-pay | Admitting: Internal Medicine

## 2020-02-21 NOTE — Telephone Encounter (Signed)
ok 

## 2020-02-21 NOTE — Telephone Encounter (Signed)
Verbal order needed  Missed PT this week 1wk 1  [phone] [facility]

## 2020-02-21 NOTE — Telephone Encounter (Signed)
Verbal message left for [place] today.

## 2020-02-24 ENCOUNTER — Telehealth (HOSPITAL_COMMUNITY): Payer: Self-pay | Admitting: Licensed Clinical Social Worker

## 2020-02-24 NOTE — Telephone Encounter (Signed)
Pt states she is in a "bad manic episode" and needs help. Pt exhibits pressured speech and is jumping topic to topic during call. Pt reports desire to "make it through Christmas" with her grandchildren. Pt denies SI/HI/self harm thoughts. Pt states she has not slept in 3 days, has high anxiety and iritability, and is panicked.  Cln encouraged pt to walk in to [facility] to address her immediate needs. Cln provided address and pt states she is familiar with the location and has someone to take her there.  Pt asked what services cln had been calling about and cln oriented pt to PHP. Pt reports interest and states interest in an intake appt. Cln offered virtual appt on 12/29 at 2pm which pt accepted. Cln confirmed email address on file as accurate.  Pt states she will walk-in to [facility] later today.

## 2020-02-25 ENCOUNTER — Telehealth: Payer: Self-pay | Admitting: Internal Medicine

## 2020-02-25 DIAGNOSIS — I5189 Other ill-defined heart diseases: Secondary | ICD-10-CM | POA: Diagnosis not present

## 2020-02-25 DIAGNOSIS — K760 Fatty (change of) liver, not elsewhere classified: Secondary | ICD-10-CM | POA: Diagnosis not present

## 2020-02-25 DIAGNOSIS — I1310 Hypertensive heart and chronic kidney disease without heart failure, with stage 1 through stage 4 chronic kidney disease, or unspecified chronic kidney disease: Secondary | ICD-10-CM | POA: Diagnosis not present

## 2020-02-25 DIAGNOSIS — R059 Cough, unspecified: Secondary | ICD-10-CM | POA: Diagnosis not present

## 2020-02-25 DIAGNOSIS — G20 Parkinson's disease: Secondary | ICD-10-CM | POA: Diagnosis not present

## 2020-02-25 DIAGNOSIS — F3110 Bipolar disorder, current episode manic without psychotic features, unspecified: Secondary | ICD-10-CM | POA: Diagnosis not present

## 2020-02-25 DIAGNOSIS — G4733 Obstructive sleep apnea (adult) (pediatric): Secondary | ICD-10-CM | POA: Diagnosis not present

## 2020-02-25 DIAGNOSIS — N1831 Chronic kidney disease, stage 3a: Secondary | ICD-10-CM | POA: Diagnosis not present

## 2020-02-25 DIAGNOSIS — R5381 Other malaise: Secondary | ICD-10-CM | POA: Diagnosis not present

## 2020-02-25 NOTE — Telephone Encounter (Signed)
Charri w/ [facility] is requesting verbal orders for physical therapy 2w2 with a start date of 03/09/2020.    Phone: [phone]

## 2020-02-25 NOTE — Telephone Encounter (Signed)
ok 

## 2020-02-25 NOTE — Telephone Encounter (Signed)
Called and left message with verbals for Charri.

## 2020-03-03 DIAGNOSIS — F250 Schizoaffective disorder, bipolar type: Secondary | ICD-10-CM | POA: Diagnosis not present

## 2020-03-03 DIAGNOSIS — F1021 Alcohol dependence, in remission: Secondary | ICD-10-CM | POA: Diagnosis not present

## 2020-03-04 ENCOUNTER — Telehealth (HOSPITAL_COMMUNITY): Payer: Self-pay | Admitting: Licensed Clinical Social Worker

## 2020-03-04 ENCOUNTER — Other Ambulatory Visit (HOSPITAL_COMMUNITY): Payer: Medicare HMO | Attending: Psychiatry

## 2020-03-04 ENCOUNTER — Other Ambulatory Visit: Payer: Self-pay

## 2020-03-12 DIAGNOSIS — N1831 Chronic kidney disease, stage 3a: Secondary | ICD-10-CM | POA: Diagnosis not present

## 2020-03-12 DIAGNOSIS — R059 Cough, unspecified: Secondary | ICD-10-CM | POA: Diagnosis not present

## 2020-03-12 DIAGNOSIS — G20 Parkinson's disease: Secondary | ICD-10-CM | POA: Diagnosis not present

## 2020-03-12 DIAGNOSIS — R5381 Other malaise: Secondary | ICD-10-CM | POA: Diagnosis not present

## 2020-03-12 DIAGNOSIS — Z87891 Personal history of nicotine dependence: Secondary | ICD-10-CM

## 2020-03-12 DIAGNOSIS — I5189 Other ill-defined heart diseases: Secondary | ICD-10-CM | POA: Diagnosis not present

## 2020-03-12 DIAGNOSIS — E7849 Other hyperlipidemia: Secondary | ICD-10-CM

## 2020-03-12 DIAGNOSIS — K760 Fatty (change of) liver, not elsewhere classified: Secondary | ICD-10-CM | POA: Diagnosis not present

## 2020-03-12 DIAGNOSIS — G4733 Obstructive sleep apnea (adult) (pediatric): Secondary | ICD-10-CM | POA: Diagnosis not present

## 2020-03-12 DIAGNOSIS — R310 Gross hematuria: Secondary | ICD-10-CM | POA: Diagnosis not present

## 2020-03-12 DIAGNOSIS — I130 Hypertensive heart and chronic kidney disease with heart failure and stage 1 through stage 4 chronic kidney disease, or unspecified chronic kidney disease: Secondary | ICD-10-CM | POA: Diagnosis not present

## 2020-03-12 DIAGNOSIS — Z9989 Dependence on other enabling machines and devices: Secondary | ICD-10-CM

## 2020-03-12 DIAGNOSIS — L308 Other specified dermatitis: Secondary | ICD-10-CM

## 2020-03-17 ENCOUNTER — Ambulatory Visit (HOSPITAL_COMMUNITY)
Admission: EM | Admit: 2020-03-17 | Discharge: 2020-03-18 | Disposition: A | Discharge status: Home / Self Care | Payer: Medicare HMO | Attending: Student | Admitting: Student

## 2020-03-17 ENCOUNTER — Other Ambulatory Visit: Payer: Self-pay

## 2020-03-17 ENCOUNTER — Encounter (HOSPITAL_COMMUNITY): Payer: Self-pay

## 2020-03-17 DIAGNOSIS — R454 Irritability and anger: Secondary | ICD-10-CM | POA: Insufficient documentation

## 2020-03-17 DIAGNOSIS — G4700 Insomnia, unspecified: Secondary | ICD-10-CM | POA: Diagnosis not present

## 2020-03-17 DIAGNOSIS — F3160 Bipolar disorder, current episode mixed, unspecified: Secondary | ICD-10-CM | POA: Diagnosis not present

## 2020-03-17 MED ORDER — QUETIAPINE FUMARATE 50 MG PO TABS
50.0000 mg | ORAL_TABLET | Freq: Once | ORAL | Status: AC
  Administered 2020-03-17: 50 mg via ORAL
  Filled 2020-03-17: qty 1

## 2020-03-17 MED ORDER — QUETIAPINE FUMARATE 50 MG PO TABS: 50.0000 mg | ORAL_TABLET | Freq: Every day | ORAL | 0 refills | Status: DC

## 2020-03-17 MED ORDER — QUETIAPINE FUMARATE 50 MG PO TABS: 50.0000 mg | ORAL_TABLET | Freq: Every day | ORAL | Status: DC

## 2020-03-17 NOTE — ED Triage Notes (Signed)
Patient arrives as walkin with daughter with complaints of not sleeping and medication issues. Patient reports she hasn't slept more than 2 hours at a time for 6 days. Reports feeling agitated - daughter corrects her to 'restless' and irritable. Reports change in ability to walk, states having a hard time, daughter states she now shuffles. Patient here in wheelchair. Patient denies selfharm/suicidal and homicidal thoughts as well as hallucinations. Patient alert & oriented x3 - off date by 2 days. Calm & cooperative.

## 2020-03-17 NOTE — ED Notes (Signed)
Meds per MAR. AVS reviewed by Einar Pheasant PA with patient with daughter and they were escorted to lobby. All questions answered. Patient in no distress at time of discharge.

## 2020-03-17 NOTE — ED Notes (Signed)
Patient belongings in locker 66

## 2020-03-17 NOTE — Discharge Instructions (Addendum)

## 2020-03-18 NOTE — BH Assessment (Addendum)
Comprehensive Clinical Assessment (CCA) Screening, Triage and Referral Note  03/18/2020 Tammy Stevens [id number]  Chief Complaint: No chief complaint on file.  Visit Diagnosis: F31.9, Bipolar I disorder, Current or most recent episode manic, Unspecified severity  Tammy Stevens is a 75 year old patient who was brought to the [facility]) by her daughter due to pt's inability to sleep for more than 2 hours at a time, if at all. Pt shared she came to the [facility] due to a "manic attack" that was believed to be the result of pt being put on Prozac. When asked when the last time was that pt had slept she stated she slept this morning after taking melatonin and 3 (she later said 2) Ativan pills because she wanted to be able to sleep. When asked why pt was put on the Prozac she and her daughter state pt experienced a depressing situation (her sister was in the hospital) and she'd been on Prozac in the past and responded well to it so requested it again.  Pt denies current SI but states she's experienced SI and attempted to kill herself in the past; she states those attempts were in [place]; she states that both times she attempted to drink herself to death. 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  Pt denies HI, AVH, NSSIB, access to guns/weapons (her daughter confirms this), engagement with the legal system, or the use of substances. Pt did, however, acknowledge that she abused her Ativan by taking 2 - 3x the prescribed dose and that she has also been using CBD vapes for 2 weeks "because it gets me high and I like it."  Pt acknowledges she feels hopeless about the possibility of "not getting out of this state." She states she has been eating more but denies her weight has changed. Pt currently lives alone. Her father experienced many of the same mental health symptoms she has had and also abused EtOH; pt states  her father killed himself when he was in his 38s.  Pt's protective factors include no SI, HI, or AVH. Her daughter is supportive.  Pt gave verbal consent for her daughter to remain in the room throughout the assessment process.  Pt is oriented x5. Her recent and remote memory is intact. Pt was cooperative and, at times, made jokes throughout the assessment. Pt's insight, judgement, and impulse control is fair at this time.   Disposition: Tammy John, PA, reviewed pt's chart and information and determined pt can be psych cleared with a prescription for Seroquel for 30 days to assist with her sleeping. Pt was also provided information for [facility] PHP/IOP programs and her daughter was encouraged to f/t with contacting them tomorrow. Pt's daughter expressed an understanding and shared no concerns or questions.   Patient Reported Information How did you hear about [place]? Family/Friend   Referral name: Tammy Stevens   Referral phone number: [phone] ([phone])  Whom do you see for routine medical problems? Primary Care   Practice/Facility Name: Tammy Stevens   Practice/Facility Phone Number: [phone] (Unknown)   Name of Contact: Tammy Stevens   Contact Number: Unknown   Contact Fax Number: Unknown   Prescriber Name: Tammy Stevens   Prescriber Address (if known): [address] [place]  What Is the Reason for Your Visit/Call Today? Pt has been having difficulties  How Long Has This Been Causing You Problems? 1 wk -  1 month  Have You Recently Been in Any Inpatient Treatment (Hospital/Detox/Crisis Center/28-Day Program)? No   Name/Location of Program/Hospital:No data recorded  How Long Were You There? No data recorded  When Were You Discharged? No data recorded Have You Ever Received Services From [facility] Before? Yes   Who Do You See at [facility]? Was seen by Tammy Stevens at [facility] on 02/14/2020  Have You Recently Had Any Thoughts About Hurting Yourself? No   Are You Planning to Commit  Suicide/Harm Yourself At This time?  No  Have you Recently Had Thoughts About [place]? No   Explanation: No data recorded Have You Used Any Alcohol or Drugs in the Past 24 Hours? Yes   How Long Ago Did You Use Drugs or Alcohol?  No data recorded  What Did You Use and How Much? Pt acknowledges she took 3x her dosage of Ativan today  What Do You Feel Would Help You the Most Today? Medication  Do You Currently Have a Therapist/Psychiatrist? Yes   Name of Therapist/Psychiatrist: Theodoro Grist, NP   Have You Been Recently Discharged From Any Office Practice or Programs? No   Explanation of Discharge From Practice/Program:  No data recorded    CCA Screening Triage Referral Assessment Type of Contact: Face-to-Face   Is this Initial or Reassessment? No data recorded  Date Telepsych consult ordered in [facility]:  No data recorded  Time Telepsych consult ordered in [facility]:  No data recorded Patient Reported Information Reviewed? Yes   Patient Left Without Being Seen? No data recorded  Reason for Not Completing Assessment: No data recorded Collateral Involvement: Pt gave verbal consent for her daughter, Tammy Stevens, to remain with her throughout the assessment process  Does Patient Have a [place]? No data recorded  Name and Contact of Legal Guardian:  No data recorded If Minor and Not Living with Parent(s), Who has Custody? N/A  Is CPS involved or ever been involved? Never  Is APS involved or ever been involved? Never  Patient Determined To Be At Risk for Harm To Self or Others Based on Review of Patient Reported Information or Presenting Complaint? No   Method: No data recorded  Availability of Means: No data recorded  Intent: No data recorded  Notification Required: No data recorded  Additional Information for Danger to Others Potential:  No data recorded  Additional Comments for Danger to Others Potential:  No data recorded  Are There Guns or Other  Weapons in Your Home?  No data recorded   Types of Guns/Weapons: No data recorded   Are These Weapons Safely Secured?                              No data recorded   Who Could Verify You Are Able To Have These Secured:    No data recorded Do You Have any Outstanding Charges, Pending Court Dates, Parole/Probation? No data recorded Contacted To Inform of Risk of Harm To Self or Others: -- (N/A)  Location of Assessment: [facility] Assessment Services  Does Patient Present under Involuntary Commitment? No   IVC Papers Initial File Date: No data recorded  [place] of Residence: [place]  Patient Currently Receiving the Following Services: Medication Management; Individual Therapy   Determination of Need: Routine (7 days)   Options For Referral: Partial Hospitalization (Pt was provided referral information for the IOP/PHP programs) Tammy Stevens, [place], reviewed pt's chart and information  and determined pt can be psych cleared with a prescription for Seroquel for 30 days to assist with her sleeping. Pt was also provided information for [facility] PHP/IOP programs and her daughter was encouraged to f/t with contacting them tomorrow. Pt's daughter expressed an understanding and shared no concerns or questions.   Dannielle Burn, LMFT

## 2020-03-18 NOTE — ED Provider Notes (Signed)
Behavioral Health Urgent Care Medical Screening Exam  Patient Name: Tammy Stevens MRN: [id number] Date of Evaluation: 03/18/20 Chief Complaint:  Lack of Sleep Diagnosis:  Final diagnoses:  Bipolar affective disorder, current episode mixed, current episode severity unspecified ([place])    History of Present illness: Tammy Stevens is a 75 y.o. female with a history of bipolar affective disorder who presents to the behavioral health urgent care as a voluntary walk-in for lack of sleep.  Patient is accompanied by her daughter.  With patient's consent, information was obtained by both the patient and her daughter Tammy Stevens: [phone]) for this encounter.  Patient states that she decided to come to the behavioral health urgent care because she "cannot sleep at all" and states that it has been "hours and days since I slept".  Patient also states that she "feels delusional", "cannot function", and "feel like I do not have my full thought process".  Patient attributes all these issues to her lack of sleep.  Patient states that she does not sleep much, and slept 2 hours this morning.  Per chart review, was assessed on February 14, 2020 at the behavioral health urgent care for worsening symptoms of mania related to the patient taking Prozac.  Patient's Prozac was discontinued at that time, patient was given a one-time dose of Haldol 5 mg p.o. and Ativan 2 mg p.o., and patient was discharged with a 7-day prescription of Haldol 5 mg p.o. nightly to assist with depleting the patient's Prozac levels.  Patient sees a psychiatric provider Tammy Chapel, NP) at [facility].  Patient and her daughter confirm that the psychiatrists has currently prescribed the patient Vraylar 3 mg p.o. at bedtime for mood, Ativan 1 mg 3 times a day p.o., gabapentin 300 mg (1 pill in the morning and 2 pills in the evening), and Lamictal 200 mg p.o. twice daily, Patient and her daughter confirmed that the  patient is taking these medications as prescribed except for the Ativan. patient states she was taking Wellbutrin, but that this was discontinued by her psychiatrist.  Patient's next appointment with her psychiatrist is on January 17 at 2:50 PM.  Patient reports that she is seeing a therapist at [facility] [place] as well, but states that she is switching to a new therapist through [facility] soon because she does not think that her current therapist is helping her. Patient states that she does not think that her current medication regimen is helping, and admits to taking more Ativan than she is prescribed on occasion (patient states that she took three 1 mg Ativan pills this morning to help her sleep instead of just taking 1). PDMP reviewed, which shows a history of the patient being prescribed Hydromet and Valium by 1 provider from May 15, 2018 - October 2021 and being prescribed Valium and Ativan by another provider from December 2021 to January 2022 (last prescription was Ativan 1 mg written and dispensed on March 23, 2020).  When asked about concern for potential addiction, patient states that she "just needs it for sleep" and is conscientious of not taking the medication for any other reasons.   When asked about SI, patient states "not right this second".  She reports no SI since 1983, but does endorse multiple suicide attempts "a long time ago" in [place] in which she "tried to drink herself to death".  Patient denies any self harming behaviors like cutting or burning herself.  She denies HI, AVH, or delusions.  Patient reports very poor sleep, and states that she slept 2 hours this morning but none last night.  She denies anhedonia, but endorses feelings of hopelessness related to lack of sleep.  Patient endorses decreased energy and patient's daughter endorses decreased concentration the patient.  Patient reports increased appetite, but no recent  weight changes.  Patient's daughter endorses easy distractibility in the patient, but denies any acts of indiscretion by the patient.  Patient endorses grandiose thoughts and flight of ideas.  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  She admits to they have been CDD for the past 2 weeks because it "makes her high and she likes it".  Patient denies any additional substance use.  Patient lives in [place] alone, and patient and her daughter deny any access to weapons in the patient's home.  Patient uses a walker at home. Patient reports that her daughter is her main source of support.  Patient denies any history of being a victim of verbal, physical, or sexual abuse.  Patient and her daughter are inquiring about trying a new medication that may help the patient sleep better, as well as inquiring about a PHP program.  On exam, patient is sitting in a wheelchair, appearing fatigued, but in no acute distress.  She is pleasant, alert and oriented x4, cooperative, and answers all questions appropriately.  Her stated mood is "sleepy" and irritable, with mood congruent affect.  She does not appear to be responding to internal stimuli.  Psychiatric Specialty Exam  Presentation  General Appearance:Casual; Appropriate for Environment; Disheveled (Fatigued)  Eye Contact:Good  Speech:Normal Rate; Clear and Coherent  Speech Volume:Normal  Handedness:Right   Mood and Affect  Mood:Irritable ("Sleepy")  Affect:Congruent   Thought Process  Thought Processes:Coherent; Goal Directed; Linear  Descriptions of Associations:Intact  Orientation:Full (Time, Place and Person)  Thought Content:WDL  Hallucinations:None  Ideas of Reference:None  Suicidal Thoughts:No  Homicidal Thoughts:No   Sensorium  Memory:Immediate Fair; Recent Fair; Remote Fair  Judgment:Fair  Insight:Good   Executive Functions   Concentration:Fair  Attention Span:Fair  [place]   Psychomotor Activity  Psychomotor Activity:Normal   Assets  Assets:Communication Skills; Desire for Improvement; Financial Resources/Insurance; Housing; Leisure Time; Physical Health; Social Support; Transportation   Sleep  Sleep:Poor  Number of hours: No data recorded  Physical Exam: Physical Exam Vitals reviewed.  Constitutional:      General: She is not in acute distress.    Appearance: She is not ill-appearing, toxic-appearing or diaphoretic.  HENT:     Head: Normocephalic and atraumatic.     Right Ear: External ear normal.     Left Ear: External ear normal.  Cardiovascular:     Rate and Rhythm: Normal rate.  Pulmonary:     Effort: Pulmonary effort is normal. No respiratory distress.  Musculoskeletal:        General: Normal range of motion.     Cervical back: Normal range of motion.  Neurological:     Mental Status: She is alert and oriented to person, place, and time.  Psychiatric:        Attention and Perception: She does not perceive auditory or visual hallucinations.        Speech: Speech normal.        Behavior: Behavior is not agitated, slowed, aggressive, withdrawn, hyperactive or combative. Behavior is cooperative.  Thought Content: Thought content is not paranoid or delusional. Thought content does not include homicidal or suicidal ideation.     Comments: Patient's mood is "sleepy" and irritable with congruent affect.  Judgment fair and insight good.    Review of Systems  Constitutional: Negative for chills, diaphoresis, fever, malaise/fatigue and weight loss.  HENT: Negative for congestion.   Respiratory: Negative for cough and shortness of breath.   Cardiovascular: Negative for chest pain and palpitations.  Gastrointestinal: Negative for abdominal pain, constipation, diarrhea, nausea and vomiting.  Musculoskeletal: Negative for joint pain and  myalgias.  Neurological: Negative for dizziness and headaches.  Psychiatric/Behavioral: Negative for depression, hallucinations, memory loss and suicidal ideas. The patient has insomnia.        Patient used to abuse alcohol but has been sober for 12 years.  All other systems reviewed and are negative.    Vitals: Blood pressure 127/68, pulse 65, temperature 98.1 F (36.7 C), temperature source Oral, resp. rate 18, SpO2 97 %. There is no height or weight on file to calculate BMI.  Musculoskeletal: Strength & Muscle Tone: within normal limits Gait & Station: normal Patient leans: [place] MSE Discharge Disposition for Follow up and Recommendations: Based on my evaluation the patient does not appear to have an emergency medical condition and can be discharged with resources and follow up care in outpatient services for Medication Management, Partial Hospitalization Program and Individual Therapy.  We will not adjust patient's current psychiatric medication regimen that is currently being prescribed by her psychiatrist.  Believe that patient may benefit from a trial of Seroquel for sleep and mood.  1 time dose of Seroquel 50 mg given to the patient on the evening of March 17, 2020 at the behavior health urgent care.  30-day supply of Seroquel nightly with 0 refills sent to patient's pharmacy of choice for them to pick up on March 18, 2020.  Patient and her daughter were instructed to try this trial of Seroquel and see if it helps with her sleep and mood, as well as to attend the patient's next psychiatry appointment on January 17 at 2:50 PM.  Instructed the patient and her daughter to mention the trial of Seroquel to the psychiatrist and discuss the potential of continuing Seroquel if the trial is helpful and potentially tapering down and ultimately discontinuing the patient's Ativan.  Since patient has been taking more Ativan than she is prescribed, recommended that the patient's mother be the  one that has possession of and gives her Ativan as prescribed.  Patient and her daughter are in agreement of this.  Contact information given to patient and her daughter for [facility] health PHP program.  Patient and her daughter state that they will call to try to schedule an appointment for this.  Also recommended that the patient stay with the daughter at her daughter's home tonight to ensure that the patient is safe and tolerating the Seroquel well.  Patient and her daughter are in agreement of this.  [facility] behavioral health center urgent care phone number provided to the patient and her daughter so they may call if they have any questions about the Seroquel. Safety planning done at length with the patient and her daughter regarding actions to take/resources to utilize if the patient becomes suicidal, homicidal, if her condition rapidly deteriorates, or if her condition continues to worsen/does not improve.  This information was provided in the patient's AVS as well. Patient and her daughter verbalized understanding and agreement  of the plan. All patient's questions answered and concerns addressed. All patient's daughter's questions answered and concerns addressed. Patient and her daughter to contract for safety for going home. Patient to be discharged to her daughter's home with her daughter.   Prescilla Sours, PA-C 03/18/2020, 1:28 AM

## 2020-03-19 ENCOUNTER — Telehealth (HOSPITAL_COMMUNITY): Payer: Self-pay | Admitting: Licensed Clinical Social Worker

## 2020-03-19 ENCOUNTER — Ambulatory Visit (HOSPITAL_COMMUNITY)
Admission: RE | Admit: 2020-03-19 | Discharge: 2020-03-19 | Disposition: A | Discharge status: Home / Self Care | Payer: Medicare HMO | Attending: Psychiatry | Admitting: Psychiatry

## 2020-03-19 NOTE — H&P (Signed)
Behavioral Health Medical Screening Exam  Tammy Stevens is an 75 y.o. female presenting to [facility] for assessment. She was seen at the [facility] two days ago for same concerns (see assessment below) and also seen at [facility] on 02/14/20. She is seen with her daughter. History is difficult to obtain, as she and her daughter are agitated that patient was not admitted to the hospital during their last two presentations to [facility], and agitated that [facility] does not have a geriatric unit. Patient states that she is delusional, although she does not express any delusional thought content. She shows no signs of responding to internal stimuli. She states suicidal thoughts to "go to sleep and die" but denies any suicidal plan or intent. Denies HI/AVH. I discussed possibility of transfer to ED to seek geripsych placement if there are safety concerns for discharge home, but patient and her daughter both refuse this and state they can keep her safe at home.  She does appear anxious and physically restless. Patient's daughter reports they filled her Seroquel rx from the [facility] two days ago and stopped all her other medications. When I inquired if a healthcare provider had advised her to stop her medications, patient's daughter denies this and states she stopped the patient's medications because she had looked up gabapentin and Lamictal online and found where they should not be prescribed for mania. She also stopped Ativan and Vraylar, but patient's daughter reports Ativan was a new medication that had not been taken regularly because it caused excessive sedation. Patient does not appear manic to me but does appear restless. I advised patient and daughter that she was showing symptoms consistent with akathisia, which is a common side effect with Vraylar. They report Arman Filter was a new medication, and her restlessness has worsened since starting this medication. I advised that due to Vraylar's long half life, she could still be experiencing this  side effect after last dose two days ago. I recommended discontinuing Vraylar, and continuing new Seroquel rx along with Lamictal and gabapentin, as patient reportedly has been taking Lamictal and gabapentin for years. Patient has been referred to [facility] and also provided with outpatient referrals during recent two presentations, as they had expressed desire for a different outpatient provider. Patient and daughter were advised that registration would see them prior to leaving. They went to the bathroom, and another staff member let them out the door before being seen by registration.  Per assessment 03/17/20: Tammy Stevens is a 75 y.o. female with a history of bipolar affective disorder who presents to the behavioral health urgent care as a voluntary walk-in for lack of sleep.  Patient is accompanied by her daughter.  With patient's consent, information was obtained by both the patient and her daughter Tammy Stevens: [phone]) for this encounter.  Patient states that she decided to come to the behavioral health urgent care because she "cannot sleep at all" and states that it has been "hours and days since I slept".  Patient also states that she "feels delusional", "cannot function", and "feel like I do not have my full thought process".  Patient attributes all these issues to her lack of sleep.  Patient states that she does not sleep much, and slept 2 hours this morning.  Per chart review, was assessed on February 14, 2020 at the behavioral health urgent care for worsening symptoms of mania related to the patient taking Prozac.  Patient's Prozac was discontinued at that time, patient was given a one-time dose of Haldol  5 mg p.o. and Ativan 2 mg p.o., and patient was discharged with a 7-day prescription of Haldol 5 mg p.o. nightly to assist with depleting the patient's Prozac levels.  Patient sees a psychiatric provider Noemi Chapel, NP) at [facility].  Patient and her daughter  confirm that the psychiatrists has currently prescribed the patient Vraylar 3 mg p.o. at bedtime for mood, Ativan 1 mg 3 times a day p.o., gabapentin 300 mg (1 pill in the morning and 2 pills in the evening), and Lamictal 200 mg p.o. twice daily, Patient and her daughter confirmed that the patient is taking these medications as prescribed except for the Ativan. patient states she was taking Wellbutrin, but that this was discontinued by her psychiatrist.  Patient's next appointment with her psychiatrist is on January 17 at 2:50 PM.  Patient reports that she is seeing a therapist at [facility] [place] as well, but states that she is switching to a new therapist through [facility] soon because she does not think that her current therapist is helping her. Patient states that she does not think that her current medication regimen is helping, and admits to taking more Ativan than she is prescribed on occasion (patient states that she took three 1 mg Ativan pills this morning to help her sleep instead of just taking 1). PDMP reviewed, which shows a history of the patient being prescribed Hydromet and Valium by 1 provider from May 15, 2018 - October 2021 and being prescribed Valium and Ativan by another provider from December 2021 to January 2022 (last prescription was Ativan 1 mg written and dispensed on March 23, 2020).  When asked about concern for potential addiction, patient states that she "just needs it for sleep" and is conscientious of not taking the medication for any other reasons.   When asked about SI, patient states "not right this second".  She reports no SI since 1983, but does endorse multiple suicide attempts "a long time ago" in [place] in which she "tried to drink herself to death".  Patient denies any self harming behaviors like cutting or burning herself.  She denies HI, AVH, or delusions.  Patient reports very poor sleep, and states that  she slept 2 hours this morning but none last night.  She denies anhedonia, but endorses feelings of hopelessness related to lack of sleep.  Patient endorses decreased energy and patient's daughter endorses decreased concentration the patient.  Patient reports increased appetite, but no recent weight changes.  Patient's daughter endorses easy distractibility in the patient, but denies any acts of indiscretion by the patient.  Patient endorses grandiose thoughts and flight of ideas.  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  She admits to they have been CDD for the past 2 weeks because it "makes her high and she likes it".  Patient denies any additional substance use.  Patient lives in [place] alone, and patient and her daughter deny any access to weapons in the patient's home.  Patient uses a walker at home. Patient reports that her daughter is her main source of support.  Patient denies any history of being a victim of verbal, physical, or sexual abuse.  Patient and her daughter are inquiring about trying a new medication that may help the patient sleep better, as well as inquiring about a PHP program.  On exam, patient is sitting in a  wheelchair, appearing fatigued, but in no acute distress.  She is pleasant, alert and oriented x4, cooperative, and answers all questions appropriately.  Her stated mood is "sleepy" and irritable, with mood congruent affect.  She does not appear to be responding to internal stimuli.  Total Time spent with patient: 30 minutes  Psychiatric Specialty Exam: Physical Exam Constitutional:      Appearance: She is well-developed and well-nourished.  Pulmonary:     Effort: Pulmonary effort is normal.  Musculoskeletal:        General: Normal range of motion.  Neurological:     Mental Status: She is alert and oriented to person, place, and time.    Review of Systems  Constitutional: Negative.    Psychiatric/Behavioral: Positive for agitation, dysphoric mood, sleep disturbance and suicidal ideas (passive). Negative for behavioral problems, confusion, decreased concentration and hallucinations. The patient is nervous/anxious. The patient is not hyperactive.    There were no vitals taken for this visit.There is no height or weight on file to calculate BMI. General Appearance: Casual Eye Contact:  Fair Speech:  Normal Rate Volume:  Normal Mood:  Anxious Affect:  Congruent Thought Process:  Coherent Orientation:  Full (Time, Place, and Person) Thought Content:  Rumination Suicidal Thoughts:  Yes.  without intent/plan Homicidal Thoughts:  No Memory:  Immediate;   Fair Recent;   Fair Remote;   Fair Judgement:  Fair Insight:  Lacking Psychomotor Activity:  Increased Concentration: Concentration: Poor and Attention Span: Poor Recall:  [place] Language: Good Akathisia:  No Handed:  Right AIMS (if indicated):    Assets:  Communication Skills Desire for Improvement Financial Resources/Insurance Housing Leisure Time Social Support Sleep:     Musculoskeletal: Strength & Muscle Tone: within normal limits Gait & Station: normal Patient leans: N/A  There were no vitals taken for this visit.  Recommendations: Based on my evaluation the patient does not appear to have an emergency medical condition.  Discontinue Vraylar, continue Seroquel, Lamictal, Neurontin. Follow up with provided PHP, outpatient referrals. Present to [facility] or ED in case of worsening symptoms or safety concerns.  Connye Burkitt, NP 03/19/2020, 1:04 PM

## 2020-03-19 NOTE — BH Assessment (Addendum)
Comprehensive Clinical Assessment (CCA) Note  Tammy Stevens is an 75 y.o. female presenting to [facility] for assessment, voluntarily. She was transported to the [facility] by her daughter Tammy Stevens). When asked what brought her to [facility] she states, "I have mental health issue", "I've been experiencing crises all my life", "I am manic", "I can't sleep or stop walking", "I can't keep myself from talking". Patient reports loosing hope due to cycling mental health symptoms.   Upon chart review: "She was seen at the [facility] two days ago for same concerns (see assessment below) and also seen at [facility] on 02/14/20. She is seen with her daughter. History is difficult to obtain, as she and her daughter are agitated that patient was not admitted to the hospital during their last two presentations to [facility], and agitated that [facility] does not have a geriatric unit. Patient states that she is delusional, although she does not express any delusional thought content. She shows no signs of responding to internal stimuli. She states suicidal thoughts to "go to sleep and die" but denies any suicidal plan or intent. Denies HI/AVH. I discussed possibility of transfer to ED to seek geripsych placement if there are safety concerns for discharge home, but patient and her daughter both refuse this and state they can keep her safe at home".  Per assessment 03/17/20: Tammy Stevens a 75 y.o.femalewith a history of bipolar affective disorder who presents to the behavioral health urgent care as a voluntary walk-in for lack of sleep. Patient is accompanied by her daughter. With patient's consent, information was obtained by both the patient and her daughter(Tammy Stevens: [phone] this encounter.  Per Assessment noted 03/17/20: "Patient states that she decided to come to the behavioral health urgent care because she "cannot sleep at all" and states that it has been "hours and days since I slept". Patient also states that she "feels  delusional", "cannot function", and "feel like I do not have my full thought process". Patient attributes all these issues to her lack of sleep. Patient states that she does not sleep much, and slept 2 hours this morning. Per chart review, was assessed on February 14, 2020 at the behavioral health urgent care for worsening symptoms of mania related to the patient taking Prozac. Patient'sProzac was discontinued at that time, patient was given a one-time dose of Haldol 5 mg p.o. and Ativan 2 mg p.o., and patient was discharged with a 7-day prescription of Haldol 5 mg p.o. nightly to assist with depleting the patient's Prozac levels. Patient sees a psychiatric provider Noemi Chapel, NP)at [facility]. Patient and her daughterconfirm that the psychiatrists has currently prescribed the patient Vraylar 3 mg p.o. at bedtime for mood, Ativan 1 mg 3 times a day p.o., gabapentin 300 mg (1 pill in the morning and 2 pills in the evening),and Lamictal 200 mg p.o. twice daily, Patient and her daughter confirmed that the patient is taking these medications as prescribedexcept for the Ativan.patient states she was taking Wellbutrin, but that this was discontinued by her psychiatrist. Patient's next appointment with her psychiatrist is on January 17 at 2:50 PM. Patient reports that she is seeing a therapist at [facility] [place] as well, but states that she is switching to a new therapist through [facility] soon because she does not think that her current therapist is helping her. Patient states that she does not think that her current medication regimen is helping, and admits to taking more  Ativan than she is prescribed on occasion (patient states that she took three 1 mg Ativan pills this morning to help her sleep instead of just taking 1). PDMP reviewed,which shows a history of the patient being prescribed Hydromet and Valium by 1  provider from May 15, 2018-October 2021 and being prescribed Valium and Ativan by another provider from December 2021 to January 2022 (last prescription was Ativan 1 mg written and dispensed on March 23, 2020). When asked about concern for potential addiction, patient states that she "just needs it for sleep" and is conscientious of not taking the medication for any other reasons."  Assessment for today 03/19/20: Patient denies suicidal ideations. She has no plan and/or intent to end her life. She reports trying to end her life in the 1990's by "drinking herself to death". No suicide attempts reported since then. Denies self mutilating behaviors. When asked about depressed symptoms she states, "It's all null and void". She denies HI and AVH's.Patient reports very poor sleep. She sleeps no more than 4 at a time. Patient reports increased appetite, but no recent weight changes. She has a history of alcohol use. However, no alcohol dependence x10 yrs.   Patient is alert and oriented x5. Her speech is slow and soft. She appears fatigue requesting to lay on the floor or a bed. She is restless and evidenced by fidgeting. She answer all questions appropriately. Insight and judgement is fair. Memory is recent and remote intact. She is dressed in [place]. Appears disheveled. She does not appear delusional or to be responding to internal stimuli.   Disposition: Per Tammy Sine, NP, patient is psych cleared. Tammy Stevens met with patient to discuss medication recommendations. She is also recommended to follow up with current provider for continued medication adjustments.   As Tammy Stevens ended her assessment with patient she was told to remain in the room until registration met with her. Patient was however let out of the facility by security. Provider and TTS was not aware of this until patient left premises.      Tammy Stevens, Counselor   03/19/2020 [name] [id number]  Chief Complaint:  Chief  Complaint  Patient presents with  . Psychiatric Evaluation   Visit Diagnosis:  Bipolar Disorder, Manic Episode     CCA Screening, Triage and Referral (STR)  Patient Reported Information How did you hear about [place]? Family/Friend  Referral name: Tammy Stevens -daughter  Referral phone number: [phone] (no phone number provided)   Whom do you see for routine medical problems? Primary Care  Practice/Facility Name: [facility]  Practice/Facility Phone Number: [phone] (Unknown)  Name of Contact: Billey [facility]  Contact Number: [phone]  Contact Fax Number: [phone]  Prescriber Name: Theodoro Grist  Prescriber Address (if known): [address] [place], [place]   What Is the Reason for Your Visit/Call Today? JESTINA STEPHANI is an 75 y.o. female presenting to [facility] for assessment. She was seen at the [facility] two days ago for same concerns (see assessment below) and also seen at [facility] on 02/14/20. She is seen with her daughter. History is difficult to obtain, as she and her daughter are agitated that patient was not admitted to the hospital during their last two presentations to [facility], and agitated that [facility] does not have a geriatric unit. Patient states that she is delusional, although she does not express any delusional thought content. She shows no signs of responding to internal stimuli. She states suicidal thoughts to "go to sleep and  die" but denies any suicidal plan or intent. Denies HI/AVH. I discussed possibility of transfer to ED to seek geripsych placement if there are safety concerns for discharge home, but patient and her daughter both refuse this and state they can keep her safe at home.  How Long Has This Been Causing You Problems? 1 wk - 1 month  What Do You Feel Would Help You the Most Today? -- (Inpatient treatment)   Have You Recently Been in Any Inpatient Treatment (Hospital/Detox/Crisis Center/28-Day Program)? No  Name/Location of Program/Hospital:No data recorded How Long  Were You There? No data recorded When Were You Discharged? No data recorded  Have You Ever Received Services From [facility] Before? Yes  Who Do You See at [facility]? Tammy Sine, NP   Have You Recently Had Any Thoughts About Hurting Yourself? No  Are You Planning to Commit Suicide/Harm Yourself At This time? No   Have you Recently Had Thoughts About [place]? No  Explanation: No data recorded  Have You Used Any Alcohol or Drugs in the Past 24 Hours? No  How Long Ago Did You Use Drugs or Alcohol? No data recorded What Did You Use and How Much? Pt acknowledges she took 3x her dosage of Ativan today   Do You Currently Have a Therapist/Psychiatrist? No  Name of Therapist/Psychiatrist: Theodoro Grist, NP   Have You Been Recently Discharged From Any Office Practice or Programs? No  Explanation of Discharge From Practice/Program: No data recorded    CCA Screening Triage Referral Assessment Type of Contact: Face-to-Face  Is this Initial or Reassessment? No data recorded Date Telepsych consult ordered in [facility]:  No data recorded Time Telepsych consult ordered in [facility]:  No data recorded  Patient Reported Information Reviewed? Yes  Patient Left Without Being Seen? No data recorded Reason for Not Completing Assessment: No data recorded  Collateral Involvement: Patient's daughter-Deanna Stevens was present during todays assessment   Does Patient Have a [place]? No data recorded Name and Contact of Legal Guardian: No data recorded If Minor and Not Living with Parent(s), Who has Custody? N/A  Is CPS involved or ever been involved? Never  Is APS involved or ever been involved? Never   Patient Determined To Be At Risk for Harm To Self or Others Based on Review of Patient Reported Information or Presenting Complaint? No  Method: No data recorded Availability of Means: No data recorded Intent: No data recorded Notification Required: No data  recorded Additional Information for Danger to Others Potential: No data recorded Additional Comments for Danger to Others Potential: No data recorded Are There Guns or Other Weapons in Your Home? No data recorded Types of Guns/Weapons: No data recorded Are These Weapons Safely Secured?                            No data recorded Who Could Verify You Are Able To Have These Secured: No data recorded Do You Have any Outstanding Charges, Pending Court Dates, Parole/Probation? No data recorded Contacted To Inform of Risk of Harm To Self or Others: -- (patient denis SI and HI)   Location of Assessment: [facility]   Does Patient Present under Involuntary Commitment? No  IVC Papers Initial File Date: No data recorded  [place] of Residence: [place]   Patient Currently Receiving the Following Services: Individual Therapy; Medication Management   Determination of Need: Routine (7 days)   Options For  Referral: Medication Management; Outpatient Therapy     CCA Biopsychosocial Intake/Chief Complaint:  RIDHIMA GOLBERG is an 75 y.o. female presenting to [facility] for assessment. She was seen at the [facility] two days ago for same concerns (see assessment below) and also seen at [facility] on 02/14/20. She is seen with her daughter. History is difficult to obtain, as she and her daughter are agitated that patient was not admitted to the hospital during their last two presentations to [facility], and agitated that [facility] does not have a geriatric unit. Patient states that she is delusional, although she does not express any delusional thought content. She shows no signs of responding to internal stimuli. She states suicidal thoughts to "go to sleep and die" but denies any suicidal plan or intent. Denies HI/AVH. I discussed possibility of transfer to ED to seek geripsych placement if there are safety concerns for discharge home, but patient and her daughter both refuse this and state they can keep her safe at  home.  Current Symptoms/Problems: she "cannot sleep at all" and states that it has been "hours and days since I slept".  Patient also states that she "feels delusional", "cannot function", and "feel like I do not have my full thought process".  Patient attributes all these issues to her lack of sleep.  Patient states that she does not sleep much, and slept 2 hours this morning.  Per chart review, was assessed on February 14, 2020 at the behavioral health urgent care for worsening symptoms of mania related to the patient taking Prozac.   Patient Reported Schizophrenia/Schizoaffective Diagnosis in Past: No   Strengths: unk  Preferences: Inpatient treatment for medication adjustments and management of current symptoms  Abilities: unkk   Type of Services Patient Feels are Needed: Inpatient treatment   Initial Clinical Notes/Concerns: No data recorded  Mental Health Symptoms Depression:  Difficulty Concentrating; Change in energy/activity; Hopelessness; Irritability   Duration of Depressive symptoms: Less than two weeks   Mania:  Change in energy/activity; Racing thoughts; Irritability   Anxiety:   Difficulty concentrating; Restlessness   Psychosis:  None   Duration of Psychotic symptoms: No data recorded  Trauma:  Difficulty staying/falling asleep; Irritability/anger   Obsessions:  Cause anxiety; N/A   Compulsions:  Disrupts with routine/functioning   Inattention:  Disorganized   Hyperactivity/Impulsivity:  N/A   Oppositional/Defiant Behaviors:  No data recorded  Emotional Irregularity:  N/A   Other Mood/Personality Symptoms:  No data recorded   Mental Status Exam Appearance and self-care  Stature:  Average   Weight:  Average weight   Clothing:  Casual   Grooming:  Normal   Cosmetic use:  None   Posture/gait:  Normal   Motor activity:  No data recorded  Sensorium  Attention:  Normal   Concentration:  Normal   Orientation:  X5   Recall/memory:  Normal    Affect and Mood  Affect:  Anxious   Mood:  Anxious   Relating  Eye contact:  Fleeting   Facial expression:  Anxious   Attitude toward examiner:  No data recorded  Thought and Language  Speech flow: Slow   Thought content:  Appropriate to Mood and Circumstances   Preoccupation:  Ruminations   Hallucinations:  -- (denies)   Organization:  No data recorded  [employer] of Knowledge:  Poor   Intelligence:  Average   Abstraction:  Normal   Judgement:  Poor   Reality Testing:  Unaware; Adequate   Insight:  Poor  Decision Making:  -- (fair)   Social Functioning  Social Maturity:  No data recorded  Social Judgement:  Normal   Stress  Stressors:  Other (Comment) (medications are not managing her mental health symptoms, per patient)   Coping Ability:  Normal   Skill Deficits:  Communication   Supports:  Family     Religion: Religion/Spirituality Are You A Religious Person?:  (unk) How Might This Affect Treatment?:  (unk)  Leisure/Recreation: Leisure / Recreation Do You Have Hobbies?: No  Exercise/Diet: Exercise/Diet Do You Exercise?: No Have You Gained or Lost A Significant Amount of Weight in the Past Six Months?: No Do You Follow a Special Diet?: No Do You Have Any Trouble Sleeping?: No   CCA Employment/Education Employment/Work Situation: Employment / Work Situation Employment situation: Unemployed Patient's job has been impacted by current illness: No What is the longest time patient has a held a job?:  (unk) Where was the patient employed at that time?:  (unk) Has patient ever been in the [employer]?: No  Education: Education Is Patient Currently Attending School?: No Last Grade Completed:  (unk) Did Teacher, adult education From [employer]?:  (unk) Did You Nutritional therapist?:  (unk) Did You Attend Graduate School?:  (unk) Did You Have Any Special Interests In School?: unk Did You Have An Individualized Education Program (IIEP):   (unk) Did You Have Any Difficulty At School?:  (unjk) Patient's Education Has Been Impacted by Current Illness:  (unk)   CCA Family/Childhood History Family and Relationship History: Family history Marital status: Single Are you sexually active?:  (unk) What is your sexual orientation?: unk Has your sexual activity been affected by drugs, alcohol, medication, or emotional stress?: unk Does patient have children?: Yes How many children?:  (1 daughter)  Childhood History:  Childhood History By whom was/is the patient raised?:  (unk) Additional childhood history information:  (unk) Description of patient's relationship with caregiver when they were a child:  (unk) Patient's description of current relationship with people who raised him/her:  (unk) How were you disciplined when you got in trouble as a child/adolescent?:  (unk) Does patient have siblings?:  (unk) Did patient suffer any verbal/emotional/physical/sexual abuse as a child?:  (unk) Did patient suffer from severe childhood neglect?:  (unk) Has patient ever been sexually abused/assaulted/raped as an adolescent or adult?:  (unk) Was the patient ever a victim of a crime or a disaster?:  (unk) Witnessed domestic violence?:  (unk) Has patient been affected by domestic violence as an adult?:  (unk)  Child/Adolescent Assessment:     CCA Substance Use Alcohol/Drug Use: Alcohol / Drug Use Pain Medications: SEE MAR Prescriptions: SEE MAR Over the Counter: SEE MAR History of alcohol / drug use?: Yes Substance #1 Name of Substance 1: Alcohol (history of abuse) 1 - Age of First Use: unk 1 - Amount (size/oz): unk 1 - Frequency: unk 1 - Duration: unk 1 - Last Use / Amount: 10 yrs ago                       ASAM's:  Six Dimensions of Multidimensional Assessment  Dimension 1:  Acute Intoxication and/or Withdrawal Potential:      Dimension 2:  Biomedical Conditions and Complications:      Dimension 3:  Emotional,  Behavioral, or Cognitive Conditions and Complications:     Dimension 4:  Readiness to Change:     Dimension 5:  Relapse, Continued use, or Continued Problem Potential:     Dimension 6:  Recovery/Living Environment:     ASAM Severity Score:    ASAM Recommended Level of Treatment:     Substance use Disorder (SUD)    Recommendations for Services/Supports/Treatments:    DSM5 Diagnoses: Patient Active Problem List   Diagnosis Date Noted  . Atherosclerosis of aorta ([place]) 01/06/2020  . Physical deconditioning 01/04/2020  . Parkinsonism ([place]) 01/04/2020  . Cough 11/01/2019  . Pre-syncope 06/21/2019  . Allergic rhinitis 06/11/2019  . 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 Episodic lightheadedness 06/11/2019  . Sinus infection 03/12/2019  . CKD (chronic kidney disease) stage 3, GFR 30-59 ml/min (HCC) 07/22/2018  . Prediabetes 10/11/2017  . Tingling 10/11/2017  . Palpitations 07/28/2017  . SOB (shortness of breath) on exertion 07/28/2017  . Diastolic dysfunction 40/98/1191  . Memory difficulties 09/23/2015  . Skin cancer 09/23/2015  . Bipolar disorder ([place]) 02/09/2015  . Headache around the eyes 04/17/2014  . Cystocele 06/24/2013  . Smokers' cough ([place]) 09/07/2010  . FATTY LIVER DISEASE 02/18/2010  . CHOLELITHIASIS, ASYMPTOMATIC 02/18/2010  . 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 IRRITABLE BOWEL SYNDROME 02/16/2010  . GERD 02/11/2010  . Hyperlipidemia 12/22/2009  . OSA (obstructive sleep apnea) 12/22/2009  . Depression 11/24/2009  . Essential hypertension 11/24/2009    Patient Centered Plan: Patient is on the following Treatment Plan(s):  Bipolar Disorder, Manic Episode    Referrals to Alternative Service(s): Referred to Alternative Service(s):   Place:   Date:   Time:    Referred to Alternative Service(s):   Place:   Date:   Time:    Referred to Alternative Service(s):   Place:   Date:   Time:    Referred to Alternative Service(s):   Place:   Date:   Time:     Comprehensive  Clinical Assessment (CCA) Screening, Triage and Referral Note  SUHANA WILNER is an 75 y.o. female presenting to [facility] for assessment, voluntarily. She was transported to the [facility] by her daughter Tammy Stevens). When asked what brought her to [facility] she states, "I have mental health issue", "I've been experiencing crises all my life", "I am manic", "I can't sleep or stop walking", "I can't keep myself from talking". Patient reports loosing hope due to cycling mental health symptoms.   Upon chart review: "She was seen at the [facility] two days ago for same concerns (see assessment below) and also seen at [facility] on 02/14/20. She is seen with her daughter. History is difficult to obtain, as she and her daughter are agitated that patient was not admitted to the hospital during their last two presentations to [facility], and agitated that [facility] does not have a geriatric unit. Patient states that she is delusional, although she does not express any delusional thought content. She shows no signs of responding to internal stimuli. She states suicidal thoughts to "go to sleep and die" but denies any suicidal plan or intent. Denies HI/AVH. I discussed possibility of transfer to ED to seek geripsych placement if there are safety concerns for discharge home, but patient and her daughter both refuse this and state they can keep her safe at home".  Per assessment 03/17/20: MERCER STALLWORTH is a 75 y.o. female with a history of bipolar affective disorder who presents to the behavioral health urgent care as a voluntary walk-in for lack of sleep.  Patient is accompanied by her daughter.  With patient's consent, information was obtained by both the patient and her daughter Coralyn Helling Stevens: [phone]) for this encounter.   Per Assessment noted 03/17/20: "Patient  states that she decided to come to the behavioral health urgent care because she "cannot sleep at all" and states that it has been "hours and days since I slept".  Patient also states that she  "feels delusional", "cannot function", and "feel like I do not have my full thought process".  Patient attributes all these issues to her lack of sleep.  Patient states that she does not sleep much, and slept 2 hours this morning.  Per chart review, was assessed on February 14, 2020 at the behavioral health urgent care for worsening symptoms of mania related to the patient taking Prozac.  Patient's Prozac was discontinued at that time, patient was given a one-time dose of Haldol 5 mg p.o. and Ativan 2 mg p.o., and patient was discharged with a 7-day prescription of Haldol 5 mg p.o. nightly to assist with depleting the patient's Prozac levels.  Patient sees a psychiatric provider Noemi Chapel, NP) at [facility].  Patient and her daughter confirm that the psychiatrists has currently prescribed the patient Vraylar 3 mg p.o. at bedtime for mood, Ativan 1 mg 3 times a day p.o., gabapentin 300 mg (1 pill in the morning and 2 pills in the evening), and Lamictal 200 mg p.o. twice daily, Patient and her daughter confirmed that the patient is taking these medications as prescribed except for the Ativan. patient states she was taking Wellbutrin, but that this was discontinued by her psychiatrist.  Patient's next appointment with her psychiatrist is on January 17 at 2:50 PM.  Patient reports that she is seeing a therapist at [facility] [place] as well, but states that she is switching to a new therapist through [facility] soon because she does not think that her current therapist is helping her. Patient states that she does not think that her current medication regimen is helping, and admits to taking more Ativan than she is prescribed on occasion (patient states that she took three 1 mg Ativan pills this morning to help her sleep instead of just taking 1). PDMP reviewed, which shows a history of the patient being prescribed Hydromet and Valium by 1  provider from May 15, 2018 - October 2021 and being prescribed Valium and Ativan by another provider from December 2021 to January 2022 (last prescription was Ativan 1 mg written and dispensed on March 23, 2020).  When asked about concern for potential addiction, patient states that she "just needs it for sleep" and is conscientious of not taking the medication for any other reasons."  Assessment for today 03/19/20: Patient denies suicidal ideations. She has no plan and/or intent to end her life. She reports trying to end her life in the 1990's by "drinking herself to death". No suicide attempts reported since then. Denies self mutilating behaviors. When asked about depressed symptoms she states, "It's all null and void". She denies HI and AVH's.Patient reports very poor sleep. She sleeps no more than 4 at a time. Patient reports increased appetite, but no recent weight changes. She has a history of alcohol use. However, no alcohol dependence x10 yrs.    Patient is alert and oriented x5. Her speech is slow and soft. She appears fatigue requesting to lay on the floor or a bed. She is restless and evidenced by fidgeting. She answer all questions appropriately. Insight and judgement is fair. Memory is recent and remote intact. She is dressed in [place]. Appears disheveled. She does not appear delusional or to be responding  to internal stimuli.   Disposition: Per Tammy Sine, NP, patient is psych cleared. Tammy Stevens met with patient to discuss medication recommendations. She is also recommended to follow up with current provider for continued medication adjustments.   As Tammy Stevens ended her assessment with patient she was told to remain in the room until registration met with her. Patient was however let out of the facility by security. Provider and TTS was not aware of this until patient left premises.      Tammy Stevens, Counselor  03/19/2020 [name] [phone]  Chief Complaint:  Chief Complaint   Patient presents with  . Psychiatric Evaluation   Visit Diagnosis: Bipolar Disorder, Manic Episode   Patient Reported Information How did you hear about [place]? Family/Friend   Referral name: Tammy Stevens -daughter   Referral phone number: [phone] (no phone number provided)  Whom do you see for routine medical problems? Primary Care   Practice/Facility Name: [facility]   Practice/Facility Phone Number: [phone] (Unknown)   Name of Contact: [facility]   Contact Number: [phone]   Contact Fax Number: [phone]   Prescriber Name: Theodoro Grist   Prescriber Address (if known): [address], [place], [place]  What Is the Reason for Your Visit/Call Today? TYMESHIA AWAN is an 75 y.o. female presenting to [facility] for assessment. She was seen at the [facility] two days ago for same concerns (see assessment below) and also seen at [facility] on 02/14/20. She is seen with her daughter. History is difficult to obtain, as she and her daughter are agitated that patient was not admitted to the hospital during their last two presentations to [facility], and agitated that [facility] does not have a geriatric unit. Patient states that she is delusional, although she does not express any delusional thought content. She shows no signs of responding to internal stimuli. She states suicidal thoughts to "go to sleep and die" but denies any suicidal plan or intent. Denies HI/AVH. I discussed possibility of transfer to ED to seek geripsych placement if there are safety concerns for discharge home, but patient and her daughter both refuse this and state they can keep her safe at home.  How Long Has This Been Causing You Problems? 1 wk - 1 month  Have You Recently Been in Any Inpatient Treatment (Hospital/Detox/Crisis Center/28-Day Program)? No   Name/Location of Program/Hospital:No data recorded  How Long Were You There? No data recorded  When Were You Discharged? No data recorded Have You Ever Received Services From [facility]  Before? Yes   Who Do You See at [facility]? Tammy Sine, NP  Have You Recently Had Any Thoughts About Hurting Yourself? No   Are You Planning to Commit Suicide/Harm Yourself At This time?  No  Have you Recently Had Thoughts About [place]? No   Explanation: No data recorded Have You Used Any Alcohol or Drugs in the Past 24 Hours? No   How Long Ago Did You Use Drugs or Alcohol?  No data recorded  What Did You Use and How Much? Pt acknowledges she took 3x her dosage of Ativan today  What Do You Feel Would Help You the Most Today? -- (Inpatient treatment)  Do You Currently Have a Therapist/Psychiatrist? No   Name of Therapist/Psychiatrist: Theodoro Grist, NP   Have You Been Recently Discharged From Any Office Practice or Programs? No   Explanation of Discharge From Practice/Program:  No data recorded    [facility] Screening Triage Referral Assessment  Type of Contact: Face-to-Face   Is this Initial or Reassessment? No data recorded  Date Telepsych consult ordered in [facility]:  No data recorded  Time Telepsych consult ordered in [facility]:  No data recorded Patient Reported Information Reviewed? Yes   Patient Left Without Being Seen? No data recorded  Reason for Not Completing Assessment: No data recorded Collateral Involvement: Patient's daughter-Deanna Stevens was present during todays assessment  Does Patient Have a [place]? No data recorded  Name and Contact of Legal Guardian:  No data recorded If Minor and Not Living with Parent(s), Who has Custody? N/A  Is CPS involved or ever been involved? Never  Is APS involved or ever been involved? Never  Patient Determined To Be At Risk for Harm To Self or Others Based on Review of Patient Reported Information or Presenting Complaint? No   Method: No data recorded  Availability of Means: No data recorded  Intent: No data recorded  Notification Required: No data recorded  Additional Information for Danger to  Others Potential:  No data recorded  Additional Comments for Danger to Others Potential:  No data recorded  Are There Guns or Other Weapons in Your Home?  No data recorded   Types of Guns/Weapons: No data recorded   Are These Weapons Safely Secured?                              No data recorded   Who Could Verify You Are Able To Have These Secured:    No data recorded Do You Have any Outstanding Charges, Pending Court Dates, Parole/Probation? No data recorded Contacted To Inform of Risk of Harm To Self or Others: -- (patient denis SI and HI)  Location of Assessment: [facility] Assessment Services  Does Patient Present under Involuntary Commitment? No   IVC Papers Initial File Date: No data recorded  [place] of Residence: [place]  Patient Currently Receiving the Following Services: Individual Therapy; Medication Management   Determination of Need: Routine (7 days)   Options For Referral: Medication Management; Outpatient Therapy   Tammy Stevens, Counselor

## 2020-03-23 ENCOUNTER — Other Ambulatory Visit: Payer: Self-pay

## 2020-03-23 DIAGNOSIS — F250 Schizoaffective disorder, bipolar type: Secondary | ICD-10-CM | POA: Diagnosis not present

## 2020-03-23 DIAGNOSIS — F1021 Alcohol dependence, in remission: Secondary | ICD-10-CM | POA: Diagnosis not present

## 2020-03-24 ENCOUNTER — Encounter: Payer: Self-pay | Admitting: Diagnostic Neuroimaging

## 2020-03-24 ENCOUNTER — Ambulatory Visit: Payer: Medicare HMO | Admitting: Diagnostic Neuroimaging

## 2020-03-24 ENCOUNTER — Telehealth: Payer: Self-pay | Admitting: Internal Medicine

## 2020-03-24 VITALS — BP 118/61 | HR 67 | Ht 66.0 in | Wt 147.2 lb

## 2020-03-24 DIAGNOSIS — F3173 Bipolar disorder, in partial remission, most recent episode manic: Secondary | ICD-10-CM | POA: Diagnosis not present

## 2020-03-24 NOTE — Telephone Encounter (Signed)
noted 

## 2020-03-24 NOTE — Telephone Encounter (Signed)
Charri w/ [facility] called and said that the patient said she does not need any more PT and refused visits.
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
## 2020-03-25 DIAGNOSIS — F250 Schizoaffective disorder, bipolar type: Secondary | ICD-10-CM | POA: Diagnosis not present

## 2020-03-28 ENCOUNTER — Other Ambulatory Visit: Payer: Self-pay | Admitting: Internal Medicine

## 2020-03-30 ENCOUNTER — Institutional Professional Consult (permissible substitution): Payer: Medicare HMO | Admitting: Pulmonary Disease

## 2020-03-30 ENCOUNTER — Ambulatory Visit: Payer: Medicare HMO | Admitting: Internal Medicine

## 2020-03-31 ENCOUNTER — Ambulatory Visit: Payer: Medicare HMO | Admitting: Internal Medicine

## 2020-04-06 ENCOUNTER — Other Ambulatory Visit: Payer: Self-pay

## 2020-04-06 ENCOUNTER — Encounter: Payer: Self-pay | Admitting: Internal Medicine

## 2020-04-06 MED ORDER — OMEPRAZOLE 20 MG PO CPDR: DELAYED_RELEASE_CAPSULE | ORAL | 1 refills | Status: DC
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
## 2020-04-06 NOTE — Patient Instructions (Addendum)
    Blood work was ordered.      Medications changes include :   none     Please followup in 6 months  

## 2020-04-07 ENCOUNTER — Encounter: Payer: Self-pay | Admitting: Internal Medicine

## 2020-04-07 ENCOUNTER — Ambulatory Visit (INDEPENDENT_AMBULATORY_CARE_PROVIDER_SITE_OTHER): Payer: Medicare HMO | Admitting: Internal Medicine

## 2020-04-07 ENCOUNTER — Other Ambulatory Visit: Payer: Self-pay

## 2020-04-07 VITALS — BP 124/78 | HR 68 | Temp 98.0°F | Ht 66.0 in | Wt 152.0 lb

## 2020-04-07 DIAGNOSIS — Z23 Encounter for immunization: Secondary | ICD-10-CM

## 2020-04-07 DIAGNOSIS — E7849 Other hyperlipidemia: Secondary | ICD-10-CM

## 2020-04-07 DIAGNOSIS — E559 Vitamin D deficiency, unspecified: Secondary | ICD-10-CM | POA: Diagnosis not present

## 2020-04-07 DIAGNOSIS — K219 Gastro-esophageal reflux disease without esophagitis: Secondary | ICD-10-CM

## 2020-04-07 DIAGNOSIS — I10 Essential (primary) hypertension: Secondary | ICD-10-CM

## 2020-04-07 DIAGNOSIS — R7303 Prediabetes: Secondary | ICD-10-CM | POA: Diagnosis not present

## 2020-04-07 DIAGNOSIS — N1831 Chronic kidney disease, stage 3a: Secondary | ICD-10-CM | POA: Diagnosis not present

## 2020-04-07 DIAGNOSIS — G20 Parkinson's disease: Secondary | ICD-10-CM | POA: Diagnosis not present

## 2020-04-07 LAB — COMPREHENSIVE METABOLIC PANEL
ALT: 19 U/L (ref 0–35)
AST: 22 U/L (ref 0–37)
Albumin: 4.5 g/dL (ref 3.5–5.2)
Alkaline Phosphatase: 81 U/L (ref 39–117)
BUN: 14 mg/dL (ref 6–23)
CO2: 32 mEq/L (ref 19–32)
Calcium: 10.3 mg/dL (ref 8.4–10.5)
Chloride: 100 mEq/L (ref 96–112)
Creatinine, Ser: 1.09 mg/dL (ref 0.40–1.20)
GFR: 50.38 mL/min — ABNORMAL LOW (ref >60.00)
Glucose, Bld: 74 mg/dL (ref 70–99)
Potassium: 4.1 mEq/L (ref 3.5–5.1)
Sodium: 139 mEq/L (ref 135–145)
Total Bilirubin: 0.3 mg/dL (ref 0.2–1.2)
Total Protein: 7.4 g/dL (ref 6.0–8.3)

## 2020-04-07 LAB — CBC WITH DIFFERENTIAL/PLATELET
Basophils Absolute: 0.1 10*3/uL (ref 0.0–0.1)
Basophils Relative: 0.8 % (ref 0.0–3.0)
Eosinophils Absolute: 0.2 10*3/uL (ref 0.0–0.7)
Eosinophils Relative: 2.9 % (ref 0.0–5.0)
HCT: 39.2 % (ref 36.0–46.0)
Hemoglobin: 13 g/dL (ref 12.0–15.0)
Lymphocytes Relative: 46.4 % — ABNORMAL HIGH (ref 12.0–46.0)
Lymphs Abs: 4 10*3/uL (ref 0.7–4.0)
MCHC: 33 g/dL (ref 30.0–36.0)
MCV: 88.2 fl (ref 78.0–100.0)
Monocytes Absolute: 0.8 10*3/uL (ref 0.1–1.0)
Monocytes Relative: 9.3 % (ref 3.0–12.0)
Neutro Abs: 3.5 10*3/uL (ref 1.4–7.7)
Neutrophils Relative %: 40.6 % — ABNORMAL LOW (ref 43.0–77.0)
Platelets: 433 10*3/uL — ABNORMAL HIGH (ref 150.0–400.0)
RBC: 4.45 Mil/uL (ref 3.87–5.11)
RDW: 14.2 % (ref 11.5–15.5)
WBC: 8.5 10*3/uL (ref 4.0–10.5)

## 2020-04-07 LAB — LIPID PANEL
Cholesterol: 238 mg/dL — ABNORMAL HIGH (ref 0–200)
HDL: 64.5 mg/dL (ref >39.00)
NonHDL: 173.06
Total CHOL/HDL Ratio: 4
Triglycerides: 210 mg/dL — ABNORMAL HIGH (ref 0.0–149.0)
VLDL: 42 mg/dL — ABNORMAL HIGH (ref 0.0–40.0)

## 2020-04-07 LAB — LDL CHOLESTEROL, DIRECT: Direct LDL: 130 mg/dL

## 2020-04-07 LAB — VITAMIN D 25 HYDROXY (VIT D DEFICIENCY, FRACTURES): VITD: 26.25 ng/mL — ABNORMAL LOW (ref 30.00–100.00)

## 2020-04-07 LAB — HEMOGLOBIN A1C: Hgb A1c MFr Bld: 6.1 % (ref 4.6–6.5)

## 2020-04-07 LAB — TSH: TSH: 1.55 u[IU]/mL (ref 0.35–4.50)

## 2020-04-07 NOTE — Assessment & Plan Note (Signed)
Chronic intermittent GERD controlled Continue omeprazole 20 mg daily prn

## 2020-04-07 NOTE — Assessment & Plan Note (Signed)
Chronic cmp 

## 2020-04-07 NOTE — Assessment & Plan Note (Addendum)
Chronic BP well controlled Continue amlodipine 5 mg daily, hctz 12.5 mg  cmp 

## 2020-04-07 NOTE — Assessment & Plan Note (Signed)
Chronic Check a1c Low sugar / carb diet Stressed regular exercise  

## 2020-04-07 NOTE — Assessment & Plan Note (Signed)
Chronic Check lipid panel  Continue crestor 10 mg daily Regular exercise and healthy diet encouraged  

## 2020-04-07 NOTE — Assessment & Plan Note (Addendum)
Chronic Taking MVI daily Check vitamin D level

## 2020-04-09 DIAGNOSIS — F250 Schizoaffective disorder, bipolar type: Secondary | ICD-10-CM | POA: Diagnosis not present

## 2020-04-09 DIAGNOSIS — F1021 Alcohol dependence, in remission: Secondary | ICD-10-CM | POA: Diagnosis not present

## 2020-04-20 DIAGNOSIS — F1021 Alcohol dependence, in remission: Secondary | ICD-10-CM | POA: Diagnosis not present

## 2020-04-20 DIAGNOSIS — F250 Schizoaffective disorder, bipolar type: Secondary | ICD-10-CM | POA: Diagnosis not present

## 2020-04-28 DIAGNOSIS — F1021 Alcohol dependence, in remission: Secondary | ICD-10-CM | POA: Diagnosis not present

## 2020-04-28 DIAGNOSIS — F250 Schizoaffective disorder, bipolar type: Secondary | ICD-10-CM | POA: Diagnosis not present

## 2020-05-05 DIAGNOSIS — H524 Presbyopia: Secondary | ICD-10-CM | POA: Diagnosis not present

## 2020-05-05 DIAGNOSIS — H52223 Regular astigmatism, bilateral: Secondary | ICD-10-CM | POA: Diagnosis not present

## 2020-05-13 ENCOUNTER — Encounter: Payer: Self-pay | Admitting: Internal Medicine

## 2020-06-08 ENCOUNTER — Encounter: Payer: Self-pay | Admitting: Pulmonary Disease

## 2020-06-08 ENCOUNTER — Other Ambulatory Visit: Payer: Self-pay

## 2020-06-08 ENCOUNTER — Ambulatory Visit: Payer: Medicare HMO | Admitting: Pulmonary Disease

## 2020-06-08 VITALS — BP 120/68 | HR 83 | Temp 97.0°F | Ht 66.0 in | Wt 163.2 lb

## 2020-06-08 DIAGNOSIS — R0600 Dyspnea, unspecified: Secondary | ICD-10-CM | POA: Diagnosis not present

## 2020-06-08 DIAGNOSIS — R0609 Other forms of dyspnea: Secondary | ICD-10-CM

## 2020-06-08 NOTE — Progress Notes (Signed)
[facility]  Chief Complaint  Patient presents with  . Consult    Productive cough with white phlegm since nov 2021, pain right shoulder blade when puts pressure on it for 5 years    Constitutional:  BP 120/68 (BP Location: Left Arm, Cuff Size: Normal)   Pulse 83   Temp (!) 97 F (36.1 C) (Temporal)   Ht 5\' 6"  (1.676 m)   Wt 163 lb 3.2 oz (74 kg)   SpO2 96% Comment: Room air  BMI 26.34 kg/m   Past Medical History:  Allergies, Bipolar, Cholelithiasis, Depression, Diverticulosis, Fatty liver, GERD, ETOH, Substance abuse, HLD, HTN, Neuropathy, CKD  Past Surgical History:  She  has a past surgical history that includes Skin graft; BTL; Breast lumpectomy; Tubal ligation; and Colonoscopy.  Brief Summary:  Tammy Stevens is a 75 y.o. female former smoker with cough.      Subjective:   She is here with her daughter.  She had a respiratory infection several months ago and developed a cough after this.  Her cough has gotten better, but she is still feeling short of breath with activity.  She had a brief period of smoking cigarettes, but her husband was a heavy smoking.  She gets pain in her left upper back when she lays on her back too long, and then feels like there is a bump.    She can walk about 1/2 way around grocery store before having to stop and rest.  No having sputum, wheeze, or chest pain.  No having leg swelling.  No history of pneumonia or leg clots.  Doesn't feel like her sleep is an issue.  She had recent cardiac assessment with Dr. Dorris Carnes.  She was not able to walk on a treadmill.  She was to have repeat Echo, but this wasn't able to be scheduled.  Physical Exam:   Appearance - well kempt   ENMT - no sinus tenderness, no oral exudate, no LAN, Mallampati 2 airway, no stridor  Respiratory - equal breath sounds bilaterally, no wheezing or rales  CV - s1s2 regular rate and rhythm, no murmurs  Ext - no clubbing, no  edema  Skin - no rashes  Psych - normal mood and affect   Pulmonary testing:    Chest Imaging:    Cardiac Tests:   Echo 10/16/15 >> EF 60 to 65%, grade 2 DD  Social History:  She  reports that she quit smoking about 8 months ago. She has never used smokeless tobacco. She reports that she does not drink alcohol and does not use drugs.  Family History:  Her family history includes Alcohol abuse in her father; Allergies in her sister; Asthma in her father and son; Depression in her father; Diabetes in an other family member; Heart attack in an other family member; Suicidality in her father.     Assessment/Plan:   Dyspnea on exertion. - will arrange for pulmonary function test and CT chest to further assess - previously seen by Dr. Dorris Carnes with [facility]; she might need to follow up with cardiology again since she wasn't able to schedule echocardiogram from her visit in October 2021 - she likely has a component of deconditioning; discussed starting a gradual exercise regimen  Scoliosis. - appears mild on chest xray - explained how this could create a pressure point in her back, and this could contribute to her back pain when sitting in certain positions   Time  Spent Involved in Patient Care on Day of Examination:  35 minutes  Follow up:  Patient Instructions  Will arrange for CT chest and pulmonary function test  Follow up in 4 to 6 weeks   Medication List:   Allergies as of 06/08/2020      Reactions   Advair Hfa [fluticasone-salmeterol]    Caused parkinsonism      Medication List       Accurate as of June 08, 2020  6:04 PM. If you have any questions, ask your nurse or doctor.        amLODipine 5 MG tablet Commonly known as: NORVASC TAKE 1 TABLET BY MOUTH EVERY DAY   aspirin EC 81 MG tablet Take 81 mg by mouth daily.   fluticasone 50 MCG/ACT nasal spray Commonly known as: FLONASE Place 2 sprays into both nostrils daily. What changed:   when  to take this  reasons to take this   gabapentin 300 MG capsule Commonly known as: NEURONTIN Take 300 mg by mouth. 1 in the morning and 2 in the evening   hydrochlorothiazide 12.5 MG capsule Commonly known as: MICROZIDE TAKE 1 CAPSULE BY MOUTH EVERY DAY   lamoTRIgine 200 MG tablet Commonly known as: LAMICTAL Take 400 mg by mouth at bedtime.   multivitamin tablet Take 1 tablet by mouth daily.   omeprazole 20 MG capsule Commonly known as: PRILOSEC TAKE 1 CAPSULE BY MOUTH EVERY DAY   QUEtiapine 300 MG tablet Commonly known as: SEROQUEL Take 300 mg by mouth at bedtime. Will increase to 1 in the morning and 1 at bedtime starting 03/26/20   rosuvastatin 10 MG tablet Commonly known as: CRESTOR TAKE 1 TABLET BY MOUTH EVERY DAY       Signature:  Chesley Mires, MD [place] Pager - [phone] 06/08/2020, 6:04 PM

## 2020-06-08 NOTE — Patient Instructions (Signed)
Will arrange for CT chest and pulmonary function test  Follow up in 4 to 6 weeks

## 2020-06-15 ENCOUNTER — Encounter: Payer: Self-pay | Admitting: Internal Medicine

## 2020-06-18 ENCOUNTER — Other Ambulatory Visit: Payer: Medicare HMO

## 2020-06-29 ENCOUNTER — Other Ambulatory Visit: Payer: Self-pay

## 2020-06-29 ENCOUNTER — Ambulatory Visit (INDEPENDENT_AMBULATORY_CARE_PROVIDER_SITE_OTHER)
Admission: RE | Admit: 2020-06-29 | Discharge: 2020-06-29 | Disposition: A | Discharge status: Home / Self Care | Payer: Medicare HMO | Source: Ambulatory Visit | Attending: Pulmonary Disease | Admitting: Pulmonary Disease

## 2020-06-29 DIAGNOSIS — I700 Atherosclerosis of aorta: Secondary | ICD-10-CM | POA: Diagnosis not present

## 2020-06-29 DIAGNOSIS — R911 Solitary pulmonary nodule: Secondary | ICD-10-CM | POA: Diagnosis not present

## 2020-06-29 DIAGNOSIS — R0600 Dyspnea, unspecified: Secondary | ICD-10-CM | POA: Diagnosis not present

## 2020-06-29 DIAGNOSIS — I2510 Atherosclerotic heart disease of native coronary artery without angina pectoris: Secondary | ICD-10-CM | POA: Diagnosis not present

## 2020-06-29 DIAGNOSIS — J929 Pleural plaque without asbestos: Secondary | ICD-10-CM | POA: Diagnosis not present

## 2020-06-29 DIAGNOSIS — R0609 Other forms of dyspnea: Secondary | ICD-10-CM

## 2020-06-30 ENCOUNTER — Encounter: Payer: Self-pay | Admitting: Internal Medicine

## 2020-07-01 ENCOUNTER — Telehealth: Payer: Self-pay | Admitting: Pulmonary Disease

## 2020-07-01 ENCOUNTER — Encounter: Payer: Self-pay | Admitting: Internal Medicine

## 2020-07-01 DIAGNOSIS — N8110 Cystocele, unspecified: Secondary | ICD-10-CM

## 2020-07-01 NOTE — Progress Notes (Signed)
Called and left message on voicemail to please return phone call to go over CT results. Contact number provided.

## 2020-07-01 NOTE — Telephone Encounter (Signed)
Please let her know that her CT chest didn't show any findings in her lungs that would explain why she is having trouble with her breathing. She does have coronary calcification and heart disease could be causing her breathing issues. She should follow up with Dr. Dorris Carnes with cardiology to assess further. Will discuss CT findings in more detail at her visit in May.   I have called and spoke with pt and she is aware of CT results per VS.

## 2020-07-20 ENCOUNTER — Other Ambulatory Visit: Payer: Self-pay | Admitting: Internal Medicine

## 2020-07-27 ENCOUNTER — Other Ambulatory Visit (HOSPITAL_COMMUNITY)
Admission: RE | Admit: 2020-07-27 | Discharge: 2020-07-27 | Disposition: A | Discharge status: Home / Self Care | Payer: Medicare HMO | Source: Ambulatory Visit | Attending: Pulmonary Disease | Admitting: Pulmonary Disease

## 2020-07-27 DIAGNOSIS — Z01812 Encounter for preprocedural laboratory examination: Secondary | ICD-10-CM | POA: Diagnosis not present

## 2020-07-27 DIAGNOSIS — Z20822 Contact with and (suspected) exposure to covid-19: Secondary | ICD-10-CM | POA: Insufficient documentation

## 2020-07-27 LAB — SARS CORONAVIRUS 2 (TAT 6-24 HRS): SARS Coronavirus 2: NEGATIVE

## 2020-07-29 ENCOUNTER — Ambulatory Visit: Payer: Medicare HMO | Admitting: Pulmonary Disease

## 2020-08-04 ENCOUNTER — Other Ambulatory Visit (HOSPITAL_COMMUNITY): Payer: Medicare HMO

## 2020-08-05 ENCOUNTER — Ambulatory Visit: Payer: Medicare HMO | Admitting: Adult Health

## 2020-08-26 DIAGNOSIS — F1021 Alcohol dependence, in remission: Secondary | ICD-10-CM | POA: Diagnosis not present

## 2020-08-26 DIAGNOSIS — F250 Schizoaffective disorder, bipolar type: Secondary | ICD-10-CM | POA: Diagnosis not present

## 2020-08-27 ENCOUNTER — Encounter: Payer: Self-pay | Admitting: Internal Medicine

## 2020-08-27 ENCOUNTER — Telehealth: Payer: Self-pay | Admitting: Internal Medicine

## 2020-08-27 NOTE — Telephone Encounter (Signed)
    Patient requesting refill for hydrochlorothiazide (MICROZIDE) 12.5 MG capsule   Pharmacy [id number] - [place], [place]. AT [address]

## 2020-08-28 NOTE — Telephone Encounter (Signed)
Pt requesting refills on hydrochlorothiazide, called the pharmacy and pt has a refill there already. Called pt to advise, but no answer.

## 2020-09-15 ENCOUNTER — Encounter: Payer: Self-pay | Admitting: Internal Medicine

## 2020-09-15 DIAGNOSIS — R29898 Other symptoms and signs involving the musculoskeletal system: Secondary | ICD-10-CM

## 2020-09-15 DIAGNOSIS — R296 Repeated falls: Secondary | ICD-10-CM

## 2020-09-15 DIAGNOSIS — R5381 Other malaise: Secondary | ICD-10-CM

## 2020-09-15 NOTE — Telephone Encounter (Signed)
LVM instructing pt to call back to schedule appt for PCP.

## 2020-09-18 ENCOUNTER — Encounter: Payer: Self-pay | Admitting: Primary Care

## 2020-09-18 ENCOUNTER — Ambulatory Visit: Payer: Medicare HMO | Admitting: Primary Care

## 2020-09-18 ENCOUNTER — Other Ambulatory Visit: Payer: Self-pay

## 2020-09-18 ENCOUNTER — Ambulatory Visit (INDEPENDENT_AMBULATORY_CARE_PROVIDER_SITE_OTHER): Payer: Medicare HMO | Admitting: Pulmonary Disease

## 2020-09-18 DIAGNOSIS — R0602 Shortness of breath: Secondary | ICD-10-CM | POA: Diagnosis not present

## 2020-09-18 DIAGNOSIS — R29898 Other symptoms and signs involving the musculoskeletal system: Secondary | ICD-10-CM

## 2020-09-18 DIAGNOSIS — R0600 Dyspnea, unspecified: Secondary | ICD-10-CM

## 2020-09-18 DIAGNOSIS — I5189 Other ill-defined heart diseases: Secondary | ICD-10-CM | POA: Diagnosis not present

## 2020-09-18 DIAGNOSIS — R0609 Other forms of dyspnea: Secondary | ICD-10-CM

## 2020-09-18 LAB — PULMONARY FUNCTION TEST
DL/VA % pred: 117 %
DL/VA: 4.78 ml/min/mmHg/L
DLCO cor % pred: 97 %
DLCO cor: 20.1 ml/min/mmHg
DLCO unc % pred: 97 %
DLCO unc: 20.1 ml/min/mmHg
FEF 25-75 Post: 0.84 L/sec
FEF 25-75 Pre: 1.09 L/sec
FEF2575-%Change-Post: -22 %
FEF2575-%Pred-Post: 46 %
FEF2575-%Pred-Pre: 59 %
FEV1-%Change-Post: -4 %
FEV1-%Pred-Post: 63 %
FEV1-%Pred-Pre: 66 %
FEV1-Post: 1.49 L
FEV1-Pre: 1.56 L
FEV1FVC-%Change-Post: 2 %
FEV1FVC-%Pred-Pre: 95 %
FEV6-%Change-Post: -6 %
FEV6-%Pred-Post: 68 %
FEV6-%Pred-Pre: 73 %
FEV6-Post: 2.03 L
FEV6-Pre: 2.18 L
FEV6FVC-%Pred-Post: 104 %
FEV6FVC-%Pred-Pre: 104 %
FVC-%Change-Post: -6 %
FVC-%Pred-Post: 65 %
FVC-%Pred-Pre: 70 %
FVC-Post: 2.03 L
FVC-Pre: 2.18 L
Post FEV1/FVC ratio: 73 %
Post FEV6/FVC ratio: 100 %
Pre FEV1/FVC ratio: 72 %
Pre FEV6/FVC Ratio: 100 %

## 2020-09-18 MED ORDER — SPIRIVA RESPIMAT 2.5 MCG/ACT IN AERS: 2.0000 | INHALATION_SPRAY | Freq: Every day | RESPIRATORY_TRACT | 0 refills | Status: DC

## 2020-09-18 NOTE — Patient Instructions (Addendum)
Pulmonary function testing today showed mild obstruction, possibly form hx smoking/second hand exposure   Recommendations: Trial Spiriva 2.5- take two puffs in the morning Follow back up with Cardiology d/t palpitations, dyspnea  Follow back up with Neurology d/t weakness, dizziness    Follow-up: 4 weeks visit with Dr. Halford Chessman or Eustaquio Maize NP

## 2020-09-18 NOTE — Progress Notes (Signed)
Full PFT performed today. °

## 2020-09-18 NOTE — Patient Instructions (Signed)
Full PFT performed today. °

## 2020-09-18 NOTE — Progress Notes (Signed)
@Patient  ID: Tammy Stevens, female    DOB: 13-Apr-1945, 75 y.o.   MRN: [id number]  Chief Complaint  Patient presents with   Shortness of Breath    Pulmonary function test    Referring provider: [name], MD  HPI: 75 year old female, former light smoker quit in July 2021.  Past medical history significant for hypertension, hyperlipidemia, chronic kidney disease, allergies, bipolar disease, depression, diverticulosis, fatty liver, substance abuse.  Patient of Dr. Halford Chessman, seen for initial consult on 06/08/2020 for dyspnea on exertion.  Previous [facility] pulmonary encounter: 06/08/20- Consult, Dr. Halford Chessman    She had a respiratory infection several months ago and developed a cough after this.  Her cough has gotten better, but she is still feeling short of breath with activity.  She had a brief period of smoking cigarettes, but her husband was a heavy smoking.  She gets pain in her left upper back when she lays on her back too long, and then feels like there is a bump.     She can walk about 1/2 way around grocery store before having to stop and rest.  No having sputum, wheeze, or chest pain.  No having leg swelling.  No history of pneumonia or leg clots.  Doesn't feel like her sleep is an issue.  She had recent cardiac assessment with Dr. Dorris Carnes.  She was not able to walk on a treadmill.  She was to have repeat Echo, but this wasn't able to be scheduled.    09/18/2020 Patient presents today for a 5-month follow-up with PFTs.  Patient experiences dyspnea with exertion. Associated leg weakness, dizziness and palpitations. She states that she can barley scoop her cat liter without getting winded.  She denies any wheezing or cough. Pulmonary function testing today showed moderate restriction without bronchodilator response. CT chest showed no acute pulmonary parenchymal findings, pleural thickening in lingula appears chronic and benign. She had small LUL pulmonary nodule. She saw neurology in January 2022  for evaluation for parkinsonism, they felt her symptoms were related to exacerbation of her bipolar disorder. She was supposed to follow-up with cardiology, they attempted to reach out to her to schedule visit several times in Nov-Dec but were unsuccessful.      Pulmonary function testing FVC 2.03 (65%), FEV1 1.49 (63%), ratio 73  09/18/2020 FENO- 19    Allergies  Allergen Reactions   Advair Hfa [Fluticasone-Salmeterol]     Caused parkinsonism    Immunization History  Administered Date(s) Administered   Fluad Quad(high Dose 65+) 04/07/2020   Influenza Whole 11/24/2009, 11/06/2011   Influenza, High Dose Seasonal PF 11/29/2013, 10/11/2016, 11/30/2017, 11/19/2018   Influenza,inj,Quad PF,6+ Mos 12/04/2012, 01/08/2015   Influenza-Unspecified 11/30/2017   PFIZER(Purple Top)SARS-COV-2 Vaccination 04/11/2019, 05/06/2019   Pneumococcal Conjugate-13 12/25/2013   Pneumococcal Polysaccharide-23 10/26/2016   Pneumococcal-Unspecified 12/04/2012   Td 10/05/2009, 11/10/2009   Zoster Recombinat (Shingrix) 06/21/2016, 10/26/2016    Past Medical History:  Diagnosis Date   Allergic rhinitis    Allergy    Bipolar disorder ([place])    Blood transfusion without reported diagnosis    as child   Cholelithiasis    See U/S 12/11   Depression    DX with Bipolar in the past    Depression    Diverticulosis    Dyspnea    Spirometry 09/26/2008 FEV1 1.85 (122%), FVC 2.57(122%),  FEV1% 72   Elevated liver enzymes    Fatty liver    See U/S 12/11   GERD (gastroesophageal reflux disease)  History of alcohol abuse    Quit 2008   History of drug abuse ([place])    Quit 2008    Hyperlipemia    Hypertension    Peripheral neuropathy    Sleep apnea    on CPAP    Tobacco History: Social History   Tobacco Use  Smoking Status Former   Types: Cigarettes   Start date: 1978   Quit date: 09/25/2019   Years since quitting: 0.9  Smokeless Tobacco Never  Tobacco Comments   very minimal   Counseling  given: Not Answered Tobacco comments: very minimal   Outpatient Medications Prior to Visit  Medication Sig Dispense Refill   amLODipine (NORVASC) 5 MG tablet TAKE 1 TABLET BY MOUTH EVERY DAY 90 tablet 1   aspirin EC 81 MG tablet Take 81 mg by mouth daily.     fluticasone (FLONASE) 50 MCG/ACT nasal spray Place 2 sprays into both nostrils daily. (Patient taking differently: Place 2 sprays into both nostrils as needed.) 16 g 6   gabapentin (NEURONTIN) 300 MG capsule Take 300 mg by mouth. 1 in the morning and 2 in the evening  1   hydrochlorothiazide (MICROZIDE) 12.5 MG capsule TAKE 1 CAPSULE BY MOUTH EVERY DAY 90 capsule 1   lamoTRIgine (LAMICTAL) 200 MG tablet Take 400 mg by mouth at bedtime.  1   Multiple Vitamin (MULTIVITAMIN) tablet Take 1 tablet by mouth daily.     omeprazole (PRILOSEC) 20 MG capsule TAKE 1 CAPSULE BY MOUTH EVERY DAY 90 capsule 1   QUEtiapine (SEROQUEL) 300 MG tablet Take 300 mg by mouth at bedtime. Will increase to 1 in the morning and 1 at bedtime starting 03/26/20     rosuvastatin (CRESTOR) 10 MG tablet TAKE 1 TABLET BY MOUTH EVERY DAY 90 tablet 1   No facility-administered medications prior to visit.   Review of Systems  Review of Systems  Constitutional: Negative.   Respiratory:  Positive for shortness of breath. Negative for cough, chest tightness and wheezing.   Cardiovascular:  Positive for palpitations. Negative for chest pain and leg swelling.  Neurological:  Positive for dizziness and weakness.    Physical Exam  BP 122/64   Pulse 71   Ht 5\' 6"  (1.676 m)   Wt 160 lb (72.6 kg)   SpO2 96%   BMI 25.82 kg/m  Physical Exam Constitutional:      Appearance: Normal appearance. She is well-developed.  HENT:     Head: Normocephalic and atraumatic.     Mouth/Throat:     Mouth: Mucous membranes are moist.     Pharynx: Oropharynx is clear.  Cardiovascular:     Rate and Rhythm: Normal rate.     Comments: Regularly irregular Pulmonary:     Effort:  Pulmonary effort is normal.     Breath sounds: Normal breath sounds. No wheezing, rhonchi or rales.  Musculoskeletal:     Cervical back: Normal range of motion and neck supple.  Skin:    General: Skin is warm and dry.  Neurological:     General: No focal deficit present.     Mental Status: She is alert and oriented to person, place, and time. Mental status is at baseline.     Motor: Weakness present.     Comments: +4 BUE; +3BLE strength   Psychiatric:        Mood and Affect: Mood normal.        Behavior: Behavior normal.        Thought Content: Thought content  normal.        Judgment: Judgment normal.     Lab Results:  CBC    Component Value Date/Time   WBC 8.5 04/07/2020 1502   RBC 4.45 04/07/2020 1502   HGB 13.0 04/07/2020 1502   HCT 39.2 04/07/2020 1502   PLT 433.0 (H) 04/07/2020 1502   PLT 312 05/21/2008 0000   MCV 88.2 04/07/2020 1502   MCH 29.1 11/27/2015 0519   MCHC 33.0 04/07/2020 1502   RDW 14.2 04/07/2020 1502   LYMPHSABS 4.0 04/07/2020 1502   MONOABS 0.8 04/07/2020 1502   EOSABS 0.2 04/07/2020 1502   BASOSABS 0.1 04/07/2020 1502    BMET    Component Value Date/Time   NA 139 04/07/2020 1502   K 4.1 04/07/2020 1502   CL 100 04/07/2020 1502   CO2 32 04/07/2020 1502   GLUCOSE 74 04/07/2020 1502   BUN 14 04/07/2020 1502   CREATININE 1.09 04/07/2020 1502   CALCIUM 10.3 04/07/2020 1502   GFRNONAA >60 11/27/2015 0519   GFRAA >60 11/27/2015 0519    BNP No results found for: BNP  ProBNP    Component Value Date/Time   PROBNP 25.0 06/20/2019 1413    Imaging: No results found.   Assessment & Plan:   SOB (shortness of breath) on exertion - Pulmonary function testing today showed mild obstruction/restriction. Recommend trial Spiriva 2.33mcg two puffs in the morning. If no improvement may consider MIP/MEP testing due to leg weakness she has been experiencing   Leg weakness - Recommend patient return to neurology d.t leg weakness and dizziness, she  would like to discuss further with her PCP   Diastolic dysfunction - Recommend patient follow-up with cardiology regarding hx grade 2 diastolic dysfunction, dyspnea on exertion, palpitations and dizziness   40 mins spent on case, >50% time face to face with patient   Martyn Ehrich, NP 09/20/2020

## 2020-09-20 ENCOUNTER — Encounter: Payer: Self-pay | Admitting: Primary Care

## 2020-09-20 DIAGNOSIS — R29898 Other symptoms and signs involving the musculoskeletal system: Secondary | ICD-10-CM | POA: Insufficient documentation

## 2020-09-20 NOTE — Assessment & Plan Note (Addendum)
-   Recommend patient return to neurology d.t leg weakness and dizziness, she would like to discuss further with her PCP

## 2020-09-20 NOTE — Assessment & Plan Note (Addendum)
-   Pulmonary function testing today showed mild obstruction/restriction. Recommend trial Spiriva 2.75mcg two puffs in the morning. If no improvement may consider MIP/MEP testing due to leg weakness she has been experiencing

## 2020-09-20 NOTE — Assessment & Plan Note (Signed)
-   Recommend patient follow-up with cardiology regarding hx grade 2 diastolic dysfunction, dyspnea on exertion, palpitations and dizziness

## 2020-09-21 NOTE — Progress Notes (Signed)
Reviewed and agree with assessment/plan.   Chesley Mires, MD [facility] Pulmonary/Critical Care 09/21/2020, 10:14 AM Pager:  [phone]

## 2020-09-23 ENCOUNTER — Ambulatory Visit: Payer: Medicare HMO | Admitting: Internal Medicine

## 2020-09-23 ENCOUNTER — Telehealth: Payer: Self-pay | Admitting: Pharmacist

## 2020-09-24 NOTE — Progress Notes (Signed)
    Chronic Care Management Pharmacy Assistant   Name: Tammy Stevens  MRN: [id number] DOB: September 01, 1945   Reason for Encounter: Disease State   Conditions to be addressed/monitored: General   Recent office visits:  04/16/20 Dr. Quay Burow Internal Medicine Blood work was ordered Return in about 6 months (around 10/05/2020) for CPE.  Recent consult visits:  06/08/20 Dr. Chesley Mires Pulmonary Disease DOE (dyspnea on exertion) Will arrange for CT chest and pulmonary function test Follow up in 4 to 6 weeks 09/18/20 Geraldo Pitter NP, (SOB) Trial Spiriva 2.5- take two puffs in the morning Follow back up with Cardiology d/t palpitations, dyspnea Follow back up with Neurology d/t weakness, dizziness   Hospital visits:  None in previous 6 months  Medications: Outpatient Encounter Medications as of 09/23/2020  Medication Sig   amLODipine (NORVASC) 5 MG tablet TAKE 1 TABLET BY MOUTH EVERY DAY   aspirin EC 81 MG tablet Take 81 mg by mouth daily.   fluticasone (FLONASE) 50 MCG/ACT nasal spray Place 2 sprays into both nostrils daily. (Patient taking differently: Place 2 sprays into both nostrils as needed.)   gabapentin (NEURONTIN) 300 MG capsule Take 300 mg by mouth. 1 in the morning and 2 in the evening   hydrochlorothiazide (MICROZIDE) 12.5 MG capsule TAKE 1 CAPSULE BY MOUTH EVERY DAY   lamoTRIgine (LAMICTAL) 200 MG tablet Take 400 mg by mouth at bedtime.   Multiple Vitamin (MULTIVITAMIN) tablet Take 1 tablet by mouth daily.   omeprazole (PRILOSEC) 20 MG capsule TAKE 1 CAPSULE BY MOUTH EVERY DAY   QUEtiapine (SEROQUEL) 300 MG tablet Take 300 mg by mouth at bedtime. Will increase to 1 in the morning and 1 at bedtime starting 03/26/20   rosuvastatin (CRESTOR) 10 MG tablet TAKE 1 TABLET BY MOUTH EVERY DAY   Tiotropium Bromide Monohydrate (SPIRIVA RESPIMAT) 2.5 MCG/ACT AERS Inhale 2 puffs into the lungs daily.   No facility-administered encounter medications on file as of 09/23/2020.    Pharmacist  Review  Made 3 attempts to call patient for a general adherence call. Left messages for patient to return call. Unable to reach patient.    [place] Pharmacist Assistant [phone]   Time spent:21

## 2020-09-25 ENCOUNTER — Telehealth: Payer: Self-pay

## 2020-09-25 DIAGNOSIS — F1021 Alcohol dependence, in remission: Secondary | ICD-10-CM | POA: Diagnosis not present

## 2020-09-25 DIAGNOSIS — F250 Schizoaffective disorder, bipolar type: Secondary | ICD-10-CM | POA: Diagnosis not present

## 2020-09-25 NOTE — Telephone Encounter (Signed)
Tammy Stevens is a 75 y.o. female was called and contacted re: New pt Pre appt call to collect history information. Pt verified using 2 identifiers. Confirmation that I am speaking with the correct person.  Chart was updated: -Allergy -Medication -Confirm pharmacy -OB history  Pt was notified to arrive 15 min early and we will need a urine sample when she arrives. Pt verbalized understanding.

## 2020-09-28 ENCOUNTER — Other Ambulatory Visit: Payer: Self-pay | Admitting: Internal Medicine
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
## 2020-09-29 ENCOUNTER — Encounter: Payer: Self-pay | Admitting: Obstetrics and Gynecology

## 2020-09-29 ENCOUNTER — Ambulatory Visit (INDEPENDENT_AMBULATORY_CARE_PROVIDER_SITE_OTHER): Payer: Medicare HMO | Admitting: Obstetrics and Gynecology

## 2020-09-29 ENCOUNTER — Other Ambulatory Visit: Payer: Self-pay

## 2020-09-29 VITALS — BP 138/82 | HR 72 | Ht 66.0 in | Wt 160.0 lb

## 2020-09-29 DIAGNOSIS — R3915 Urgency of urination: Secondary | ICD-10-CM

## 2020-09-29 DIAGNOSIS — K5900 Constipation, unspecified: Secondary | ICD-10-CM

## 2020-09-29 DIAGNOSIS — N8110 Cystocele, unspecified: Secondary | ICD-10-CM | POA: Diagnosis not present

## 2020-09-29 DIAGNOSIS — N816 Rectocele: Secondary | ICD-10-CM | POA: Diagnosis not present

## 2020-09-29 LAB — POCT URINALYSIS DIPSTICK
Appearance: NORMAL
Bilirubin, UA: NEGATIVE
Blood, UA: NEGATIVE
Glucose, UA: NEGATIVE
Ketones, UA: NEGATIVE
Nitrite, UA: NEGATIVE
Protein, UA: NEGATIVE
Spec Grav, UA: 1.025 (ref 1.010–1.025)
Urobilinogen, UA: 0.2 E.U./dL
pH, UA: 5.5 (ref 5.0–8.0)
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
## 2020-09-29 NOTE — Patient Instructions (Addendum)
Women should try to eat at least 21 to 25 grams of fiber a day, while men should aim for 30 to 38 grams a day. You can add fiber to your diet with food or a fiber supplement such as psyllium (metamucil), benefiber, or fibercon.   Here's a look at how much dietary fiber is found in some common foods. When buying packaged foods, check the Nutrition Facts label for fiber content. It can vary among brands.  Fruits Serving size Total fiber (grams)*  Raspberries 1 cup 8.0  Pear 1 medium 5.5  Apple, with skin 1 medium 4.5  Banana 1 medium 3.0  Orange 1 medium 3.0  Strawberries 1 cup 3.0   Vegetables Serving size Total fiber (grams)*  Green peas, boiled 1 cup 9.0  Broccoli, boiled 1 cup chopped 5.0  Turnip greens, boiled 1 cup 5.0  Brussels sprouts, boiled 1 cup 4.0  Potato, with skin, baked 1 medium 4.0  Sweet corn, boiled 1 cup 3.5  Cauliflower, raw 1 cup chopped 2.0  Carrot, raw 1 medium 1.5   Grains Serving size Total fiber (grams)*  Spaghetti, whole-wheat, cooked 1 cup 6.0  Barley, pearled, cooked 1 cup 6.0  Bran flakes 3/4 cup 5.5  Quinoa, cooked 1 cup 5.0  Oat bran muffin 1 medium 5.0  Oatmeal, instant, cooked 1 cup 5.0  Popcorn, air-popped 3 cups 3.5  Brown rice, cooked 1 cup 3.5  Bread, whole-wheat 1 slice 2.0  Bread, rye 1 slice 2.0   Legumes, nuts and seeds Serving size Total fiber (grams)*  Split peas, boiled 1 cup 16.0  Lentils, boiled 1 cup 15.5  Black beans, boiled 1 cup 15.0  Baked beans, canned 1 cup 10.0  Chia seeds 1 ounce 10.0  Almonds 1 ounce (23 nuts) 3.5  Pistachios 1 ounce (49 nuts) 3.0  Sunflower kernels 1 ounce 3.0  *Rounded to nearest 0.5 gram. Source: [employer] for [employer], [employer] have a stage 2 (out of 4) prolapse.  We discussed the fact that it is not life threatening but there are several treatment options. For treatment of pelvic organ prolapse, we discussed options for management including  expectant management, conservative management, and surgical management, such as Kegels, a pessary, pelvic floor physical therapy, and specific surgical procedures.

## 2020-09-30 ENCOUNTER — Ambulatory Visit (INDEPENDENT_AMBULATORY_CARE_PROVIDER_SITE_OTHER): Payer: Medicare HMO | Admitting: Internal Medicine

## 2020-09-30 ENCOUNTER — Encounter: Payer: Self-pay | Admitting: Internal Medicine

## 2020-09-30 ENCOUNTER — Ambulatory Visit (INDEPENDENT_AMBULATORY_CARE_PROVIDER_SITE_OTHER): Payer: Medicare HMO

## 2020-09-30 VITALS — BP 130/60 | HR 86 | Temp 98.6°F | Ht 66.0 in | Wt 161.2 lb

## 2020-09-30 DIAGNOSIS — E559 Vitamin D deficiency, unspecified: Secondary | ICD-10-CM

## 2020-09-30 DIAGNOSIS — R410 Disorientation, unspecified: Secondary | ICD-10-CM | POA: Diagnosis not present

## 2020-09-30 DIAGNOSIS — I10 Essential (primary) hypertension: Secondary | ICD-10-CM

## 2020-09-30 DIAGNOSIS — R4781 Slurred speech: Secondary | ICD-10-CM

## 2020-09-30 DIAGNOSIS — R7303 Prediabetes: Secondary | ICD-10-CM | POA: Diagnosis not present

## 2020-09-30 DIAGNOSIS — R531 Weakness: Secondary | ICD-10-CM

## 2020-09-30 DIAGNOSIS — R296 Repeated falls: Secondary | ICD-10-CM

## 2020-09-30 DIAGNOSIS — R002 Palpitations: Secondary | ICD-10-CM

## 2020-09-30 LAB — CBC WITH DIFFERENTIAL/PLATELET
Basophils Absolute: 0 10*3/uL (ref 0.0–0.1)
Basophils Relative: 0.7 % (ref 0.0–3.0)
Eosinophils Absolute: 0.1 10*3/uL (ref 0.0–0.7)
Eosinophils Relative: 2.5 % (ref 0.0–5.0)
HCT: 38.5 % (ref 36.0–46.0)
Hemoglobin: 12.7 g/dL (ref 12.0–15.0)
Lymphocytes Relative: 43.6 % (ref 12.0–46.0)
Lymphs Abs: 2.5 10*3/uL (ref 0.7–4.0)
MCHC: 32.9 g/dL (ref 30.0–36.0)
MCV: 88.1 fl (ref 78.0–100.0)
Monocytes Absolute: 0.6 10*3/uL (ref 0.1–1.0)
Monocytes Relative: 9.8 % (ref 3.0–12.0)
Neutro Abs: 2.5 10*3/uL (ref 1.4–7.7)
Neutrophils Relative %: 43.4 % (ref 43.0–77.0)
Platelets: 397 10*3/uL (ref 150.0–400.0)
RBC: 4.37 Mil/uL (ref 3.87–5.11)
RDW: 14.1 % (ref 11.5–15.5)
WBC: 5.7 10*3/uL (ref 4.0–10.5)

## 2020-09-30 LAB — TSH: TSH: 1.58 u[IU]/mL (ref 0.35–5.50)

## 2020-09-30 LAB — VITAMIN D 25 HYDROXY (VIT D DEFICIENCY, FRACTURES): VITD: 40.52 ng/mL (ref 30.00–100.00)

## 2020-09-30 LAB — COMPREHENSIVE METABOLIC PANEL
ALT: 24 U/L (ref 0–35)
AST: 23 U/L (ref 0–37)
Albumin: 4.2 g/dL (ref 3.5–5.2)
Alkaline Phosphatase: 80 U/L (ref 39–117)
BUN: 16 mg/dL (ref 6–23)
CO2: 30 mEq/L (ref 19–32)
Calcium: 10.1 mg/dL (ref 8.4–10.5)
Chloride: 100 mEq/L (ref 96–112)
Creatinine, Ser: 1.38 mg/dL — ABNORMAL HIGH (ref 0.40–1.20)
GFR: 37.83 mL/min — ABNORMAL LOW (ref >60.00)
Glucose, Bld: 101 mg/dL — ABNORMAL HIGH (ref 70–99)
Potassium: 3.6 mEq/L (ref 3.5–5.1)
Sodium: 140 mEq/L (ref 135–145)
Total Bilirubin: 0.3 mg/dL (ref 0.2–1.2)
Total Protein: 7.1 g/dL (ref 6.0–8.3)

## 2020-09-30 LAB — HEMOGLOBIN A1C: Hgb A1c MFr Bld: 6.1 % (ref 4.6–6.5)

## 2020-09-30 NOTE — Assessment & Plan Note (Signed)
Chronic Check a1c 

## 2020-09-30 NOTE — Patient Instructions (Addendum)
  Blood work was ordered.      Medications changes include :   none     An holter monitor was ordered.

## 2020-09-30 NOTE — Progress Notes (Unsigned)
Enrolled patient for a 3 day Zio XT monitor to be mailed to patients home   Dr Harrington Challenger to read

## 2020-09-30 NOTE — Assessment & Plan Note (Addendum)
Chronic Has seen cardio - echo ordered, but she did not want to do it at that time Will order a holter to r/o arrhythmia  Heart in sinus rhythm on exam Cbc, tsh

## 2020-09-30 NOTE — Assessment & Plan Note (Signed)
Chronic Episodes of whole body weakness with palps, lightheadedness, palps, confusion, slurred speech ? Medication related - is she taking medication appropriately -- advised using a pill box so she can double check herself Will evaluate palpitations but that seem unlikely to be the cause ? Psychiatric - following with Noemi [facility] Will refer to neuro for their input Will get MRI brain - will need open MRI if we can get it approved Cbc, cmp, tsh, a1c

## 2020-09-30 NOTE — Assessment & Plan Note (Signed)
Chronic BP well controlled Doubt symptoms are related to BP Continue amlodipine 5 mg qd, hctz 12.5 mg qd cmp

## 2020-09-30 NOTE — Assessment & Plan Note (Signed)
Chronic More falls Would benefit from walker - will get platform walker

## 2020-09-30 NOTE — Assessment & Plan Note (Signed)
Chronic ?Check vitamin d level ?

## 2020-10-02 ENCOUNTER — Encounter: Payer: Self-pay | Admitting: Neurology

## 2020-10-04 DIAGNOSIS — M6281 Muscle weakness (generalized): Secondary | ICD-10-CM | POA: Diagnosis not present

## 2020-10-09 ENCOUNTER — Telehealth: Payer: Self-pay | Admitting: Internal Medicine

## 2020-10-09 NOTE — Telephone Encounter (Signed)
Ref# [id number] calling to report pt's monitor has fallen off. New one being shipped over night should arrive tomorrow.

## 2020-10-11 NOTE — Addendum Note (Signed)
Addended by: [name] on: 10/11/2020 04:09 PM   Modules accepted: Orders

## 2020-10-12 ENCOUNTER — Ambulatory Visit: Payer: Medicare HMO | Admitting: Obstetrics and Gynecology

## 2020-10-16 ENCOUNTER — Ambulatory Visit: Payer: Medicare HMO | Admitting: Obstetrics and Gynecology

## 2020-10-16 DIAGNOSIS — R002 Palpitations: Secondary | ICD-10-CM

## 2020-10-18 NOTE — Progress Notes (Deleted)
Subjective:    Patient ID: Tammy Stevens, female    DOB: [birth year], 75 y.o.   MRN: [id number]  HPI The patient is here for follow up of her poor balance, leg weakness and recurrent falls.  ( Episodes of whole body weakness, palps, lightheadedness, confusion and slurred speech)   To see neuro on 9/30, following with psych, using pill box, holter ordered..      Medications and allergies reviewed with patient and updated if appropriate.  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   Episodic lightheadedness 06/11/2019   CKD (chronic kidney disease) stage 3, GFR 30-59 ml/min (HCC) 07/22/2018   Prediabetes 10/11/2017   Palpitations 07/28/2017   SOB (shortness of breath) on exertion [id number]   Diastolic dysfunction [id number]   Skin cancer 09/23/2015   Bipolar disorder ([place]) 02/09/2015   Cystocele 06/24/2013   Smokers' cough ([place]) 09/07/2010   FATTY LIVER DISEASE 02/18/2010   CHOLELITHIASIS, ASYMPTOMATIC 02/18/2010   ALCOHOL ABUSE, IN REMISSION 02/16/2010   IRRITABLE BOWEL SYNDROME 02/16/2010   GERD 02/11/2010   Hyperlipidemia 12/22/2009   OSA (obstructive sleep apnea) 12/22/2009   Depression 11/24/2009   Essential hypertension 11/24/2009    Current Outpatient Medications on File Prior to Visit  Medication Sig Dispense Refill   amLODipine (NORVASC) 5 MG tablet TAKE 1 TABLET BY MOUTH EVERY DAY 90 tablet 1   aspirin EC 81 MG tablet Take 81 mg by mouth daily.     gabapentin (NEURONTIN) 300 MG capsule Take 300 mg by mouth every evening. Patient takes BID in the evening  1   hydrochlorothiazide (MICROZIDE) 12.5 MG capsule TAKE 1 CAPSULE BY MOUTH EVERY DAY 90 capsule 1    lamoTRIgine (LAMICTAL) 200 MG tablet Take 400 mg by mouth at bedtime.  1   Multiple Vitamin (MULTIVITAMIN) tablet Take 1 tablet by mouth daily.     omeprazole (PRILOSEC) 20 MG capsule TAKE 1 CAPSULE BY MOUTH EVERY DAY 90 capsule 1   QUEtiapine (SEROQUEL) 300 MG tablet Take 300 mg by mouth at bedtime. Will increase to 1 in the morning and 1 at bedtime starting 03/26/20     Tiotropium Bromide Monohydrate (SPIRIVA RESPIMAT) 2.5 MCG/ACT AERS Inhale 2 puffs into the lungs daily. 4 g 0   No current facility-administered medications on file prior to visit.    Past Medical History:  Diagnosis Date   Allergic rhinitis    Allergy    Bipolar disorder ([place])    Blood transfusion without reported diagnosis    as child   Cholelithiasis    See U/S 12/11   Depression    DX with Bipolar in the past    Depression    Diverticulosis    Dyspnea    Spirometry 09/26/2008 FEV1 1.85 (122%), FVC 2.57(122%),  FEV1% 72   Elevated liver enzymes    Fatty liver    See U/S 12/11   GERD (gastroesophageal reflux disease)    History of alcohol abuse    Quit 2008   History of drug abuse ([place])    Quit 2008    Hyperlipemia    Hypertension    Peripheral neuropathy    Sleep apnea    on CPAP  Past Surgical History:  Procedure Laterality Date   BREAST LUMPECTOMY     BTL     COLONOSCOPY     SKIN GRAFT     after Aleya Durnell as a child    TUBAL LIGATION      Social History   Socioeconomic History   Marital status: Divorced    Spouse name: Not on file   Number of children: 2   Years of education: 12   Highest education level: Not on file  Occupational History   Occupation: retired     Fish farm manager: [employer]  Tobacco Use   Smoking status: Former    Types: Cigarettes    Start date: 1978    Quit date: 09/25/2019    Years since quitting: 1.0   Smokeless tobacco: Never   Tobacco comments:    very minimal  Vaping Use   Vaping Use: Former  Substance and Sexual Activity   Alcohol use: No     Alcohol/week: 0.0 standard drinks    Comment: Quit 2008   Drug use: No   Sexual activity: Not Currently  Other Topics Concern   Not on file  Social History Narrative   Right handed   1/2 cup coffee q morning, soda q afternoon   Social Determinants of Health   Financial Resource Strain: Low Risk    Difficulty of Paying Living Expenses: Not hard at all  Food Insecurity: No Food Insecurity   Worried About Charity fundraiser in the Last Year: Never true   [place] in the Last Year: Never true  Transportation Needs: No Transportation Needs   Lack of Transportation (Medical): No   Lack of Transportation (Non-Medical): No  Physical Activity: Inactive   Days of Exercise per Week: 0 days   Minutes of Exercise per Session: 0 min  Stress: No Stress Concern Present   Feeling of Stress : Not at all  Social Connections: Socially Isolated   Frequency of Communication with Friends and Family: More than three times a week   Frequency of Social Gatherings with Friends and Family: Once a week   Attends Religious Services: Never   Marine scientist or Organizations: No   Attends Music therapist: Never   Marital Status: Never married    Family History  Problem Relation Age of Onset   Diabetes Other        GM? (not specific)   Heart attack Other        GF? (not specific)   Asthma Son    Asthma Father    Alcohol abuse Father    Suicidality Father    Depression Father    Allergies Sister    Colon cancer Neg Hx    Esophageal cancer Neg Hx    Rectal cancer Neg Hx    Stomach cancer Neg Hx     Review of Systems     Objective:  There were no vitals filed for this visit. BP Readings from Last 3 Encounters:  09/30/20 130/60  09/29/20 138/82  09/18/20 122/64   Wt Readings from Last 3 Encounters:  09/30/20 161 lb 3.2 oz (73.1 kg)  09/29/20 160 lb (72.6 kg)  09/18/20 160 lb (72.6 kg)   There is no height or weight on file to calculate BMI.   Physical  Exam    Constitutional: Appears well-developed and well-nourished. No distress.  HENT:  Head: Normocephalic and atraumatic.  Neck: Neck supple. No tracheal deviation present. No thyromegaly present.  No cervical lymphadenopathy Cardiovascular: Normal rate, regular rhythm and normal heart sounds.   No murmur heard. No carotid bruit .  No edema Pulmonary/Chest: Effort normal and breath sounds normal. No respiratory distress. No has no wheezes. No rales.  Skin: Skin is warm and dry. Not diaphoretic.  Psychiatric: Normal mood and affect. Behavior is normal.      Assessment & Plan:    See Problem List for Assessment and Plan of chronic medical problems.    This visit occurred during the SARS-CoV-2 public health emergency.  Safety protocols were in place, including screening questions prior to the visit, additional usage of staff PPE, and extensive cleaning of exam room while observing appropriate contact time as indicated for disinfecting solutions.

## 2020-10-19 ENCOUNTER — Ambulatory Visit: Payer: Medicare HMO | Admitting: Internal Medicine

## 2020-10-20 NOTE — Progress Notes (Signed)
Urogynecology   Subjective:     Chief Complaint: Follow-up Tammy Stevens is a 75 y.o. female here for a pessary fitting.)  History of Present Illness: Tammy Stevens is a 75 y.o. female with stage II pelvic organ prolapse who presents today for a pessary fitting.    Past Medical History: Patient  has a past medical history of Allergic rhinitis, Allergy, Bipolar disorder ([place]), Blood transfusion without reported diagnosis, Cholelithiasis, Depression, Depression, Diverticulosis, Dyspnea, Elevated liver enzymes, Fatty liver, GERD (gastroesophageal reflux disease), History of alcohol abuse, History of drug abuse ([place]), Hyperlipemia, Hypertension, Peripheral neuropathy, and Sleep apnea.   Past Surgical History: She  has a past surgical history that includes Skin graft; BTL; Breast lumpectomy; Tubal ligation; and Colonoscopy.   Medications: She has a current medication list which includes the following prescription(s): amlodipine, aspirin ec, gabapentin, hydrochlorothiazide, lamotrigine, multivitamin, omeprazole, quetiapine, and spiriva respimat.   Allergies: Patient is allergic to advair hfa [fluticasone-salmeterol].   Social History: Patient  reports that she quit smoking about 12 months ago. Her smoking use included cigarettes. She started smoking about 44 years ago. She has never used smokeless tobacco. She reports that she does not drink alcohol and does not use drugs.      Objective:    BP 110/76   Pulse 61   Wt 161 lb (73 kg)   BMI 25.99 kg/m  Gen: No apparent distress, A&O x 3. Pelvic Exam: Normal external female genitalia; Bartholin's and Skene's glands normal in appearance; urethral meatus normal in appearance, no urethral masses or discharge.   A size #3 (2-3/4 in) ring with support pessary was fitted. It was appropriate size on examination, but uncomfortable when walking around. Therefore this was removed and a size #1 (2in) ring with support pessary was  placed. This was more comfortable, stayed in place with valsalva and standing/ bending. It fit well with finger fitting between the pessary and the vaginal walls. Patient was unable to remove on her own.   POP-Q:    POP-Q   0                                            Aa   0                                           Ba   -6                                              C    3.5                                            Gh   3                                            Pb   8  tvl    -1                                            Ap   -1                                            Bp   -7                                              D      Assessment/Plan:    Assessment: Ms. Strope is a 75 y.o. with stage II pelvic organ prolapse who presents for a pessary fitting. Plan: She was fitted with a 2in ring with support pessary. She will keep the pessary in place until next visit. May eventually want to try to remove and place herself.   Follow-up in 2 weeks for a pessary check or sooner as needed.  All questions were answered.    Jaquita Folds, MD

## 2020-10-21 ENCOUNTER — Encounter: Payer: Self-pay | Admitting: Obstetrics and Gynecology

## 2020-10-21 ENCOUNTER — Ambulatory Visit (INDEPENDENT_AMBULATORY_CARE_PROVIDER_SITE_OTHER): Payer: Medicare HMO | Admitting: Obstetrics and Gynecology

## 2020-10-21 ENCOUNTER — Other Ambulatory Visit: Payer: Self-pay

## 2020-10-21 VITALS — BP 110/76 | HR 61 | Wt 161.0 lb

## 2020-10-21 DIAGNOSIS — N8110 Cystocele, unspecified: Secondary | ICD-10-CM | POA: Diagnosis not present

## 2020-10-21 DIAGNOSIS — N816 Rectocele: Secondary | ICD-10-CM

## 2020-10-23 ENCOUNTER — Other Ambulatory Visit: Payer: Self-pay | Admitting: Internal Medicine

## 2020-10-26 DIAGNOSIS — R002 Palpitations: Secondary | ICD-10-CM | POA: Diagnosis not present

## 2020-11-05 NOTE — Progress Notes (Signed)
[facility]   Subjective:     Chief Complaint:  Chief Complaint  Patient presents with   Follow-up    Tammy Stevens is a 75 y.o. female here to discuss surgery. Pessary came out the day of the appt.   History of Present Illness: Tammy Stevens is a 75 y.o. female with stage II pelvic organ prolapse who presents for a pessary check. She was using a size 2in ring with support pessary but this fell out shortly after she got home. She is not sure she wants another pessary and wants to discuss surgical options.   Past Medical History: Patient  has a past medical history of Allergic rhinitis, Allergy, Bipolar disorder ([place]), Blood transfusion without reported diagnosis, Cholelithiasis, Depression, Depression, Diverticulosis, Dyspnea, Elevated liver enzymes, Fatty liver, GERD (gastroesophageal reflux disease), History of alcohol abuse, History of drug abuse ([place]), Hyperlipemia, Hypertension, Peripheral neuropathy, and Sleep apnea.   Past Surgical History: She  has a past surgical history that includes Skin graft; BTL; Breast lumpectomy; Tubal ligation; and Colonoscopy.   Medications: She has a current medication list which includes the following prescription(s): amlodipine, aspirin ec, gabapentin, hydrochlorothiazide, lamotrigine, multivitamin, omeprazole, quetiapine, and spiriva respimat.   Allergies: Patient is allergic to advair hfa [fluticasone-salmeterol].   Social History: Patient  reports that she quit smoking about 13 months ago. Her smoking use included cigarettes. She started smoking about 44 years ago. She has never used smokeless tobacco. She reports that she does not drink alcohol and does not use drugs.      Objective:    Physical Exam: BP 124/74   Pulse 71   Wt 161 lb (73 kg)   BMI 25.99 kg/m  Gen: No apparent distress, A&O x 3.      Assessment/Plan:    Assessment: Tammy Stevens is a 75 y.o. with stage II pelvic organ prolapse with stage II  POP.  Plan: Stage II anterior, Stage II posterior, Stage I apical prolapse - We discussed expectant management vs trying another pessary vs surgery.  - For surgery, would recommend an anterior and posterior repair. We reviewed surgical course and expectations. We also discussed the option of urodynamic testing prior to the procedure to determine need for anti-incontinence procedure.  - She will decide which option she wants to pursue and notify us.    Time Spent: Time spent: I spent 25 minutes dedicated to the care of this patient on the date of this encounter to include pre-visit review of records, face-to-face time with the patient and post visit documentation.

## 2020-11-10 ENCOUNTER — Encounter: Payer: Self-pay | Admitting: Obstetrics and Gynecology

## 2020-11-10 ENCOUNTER — Other Ambulatory Visit: Payer: Self-pay

## 2020-11-10 ENCOUNTER — Ambulatory Visit (INDEPENDENT_AMBULATORY_CARE_PROVIDER_SITE_OTHER): Payer: Medicare HMO | Admitting: Obstetrics and Gynecology

## 2020-11-10 VITALS — BP 124/74 | HR 71 | Wt 161.0 lb

## 2020-11-10 DIAGNOSIS — N8110 Cystocele, unspecified: Secondary | ICD-10-CM | POA: Diagnosis not present

## 2020-11-10 DIAGNOSIS — N816 Rectocele: Secondary | ICD-10-CM

## 2020-11-13 ENCOUNTER — Ambulatory Visit: Payer: Medicare HMO | Admitting: Pulmonary Disease

## 2020-11-14 ENCOUNTER — Other Ambulatory Visit: Payer: Self-pay | Admitting: Internal Medicine

## 2020-11-19 ENCOUNTER — Other Ambulatory Visit: Payer: Self-pay | Admitting: Internal Medicine

## 2020-12-01 ENCOUNTER — Encounter: Payer: Self-pay | Admitting: Internal Medicine

## 2020-12-04 ENCOUNTER — Ambulatory Visit: Payer: Medicare HMO | Admitting: Neurology

## 2020-12-05 ENCOUNTER — Other Ambulatory Visit: Payer: Self-pay | Admitting: Internal Medicine

## 2020-12-14 ENCOUNTER — Encounter: Payer: Self-pay | Admitting: Internal Medicine

## 2020-12-22 ENCOUNTER — Encounter: Payer: Self-pay | Admitting: Internal Medicine

## 2020-12-23 ENCOUNTER — Telehealth: Payer: Self-pay

## 2020-12-23 NOTE — Progress Notes (Signed)
    Chronic Care Management Pharmacy Assistant   Name: [name]  MRN: [id number] DOB: 02/01/1946   Reason for Encounter: Disease State   Conditions to be addressed/monitored: General   Recent office visits:  09/30/20 [name], MD-PCP (Trouble walking) no med changes  Recent consult visits:  11/10/20 Jaquita Folds, MD-Obstetrics/Gynecology (Prolapse of anterior vaginal wall) no med changes  10/21/20 Jaquita Folds, MD-Obstetrics/Gynecology (Prolapse of anterior vaginal wall) no med changes  Hospital visits:  None in previous 6 months  Medications: Outpatient Encounter Medications as of 12/23/2020  Medication Sig   amLODipine (NORVASC) 5 MG tablet TAKE 1 TABLET BY MOUTH EVERY DAY   aspirin EC 81 MG tablet Take 81 mg by mouth daily.   gabapentin (NEURONTIN) 300 MG capsule Take 300 mg by mouth every evening. Pt takes 1 at night   hydrochlorothiazide (MICROZIDE) 12.5 MG capsule TAKE 1 CAPSULE BY MOUTH EVERY DAY   lamoTRIgine (LAMICTAL) 200 MG tablet Take 400 mg by mouth at bedtime.   Multiple Vitamin (MULTIVITAMIN) tablet Take 1 tablet by mouth daily.   omeprazole (PRILOSEC) 20 MG capsule TAKE 1 CAPSULE BY MOUTH EVERY DAY   QUEtiapine (SEROQUEL) 300 MG tablet Take 300 mg by mouth at bedtime. Will increase to 1 in the morning and 1 at bedtime starting 03/26/20   Tiotropium Bromide Monohydrate (SPIRIVA RESPIMAT) 2.5 MCG/ACT AERS Inhale 2 puffs into the lungs daily.   No facility-administered encounter medications on file as of 12/23/2020.   Reviewed chart for medication changes and drug therapy problems ahead of medication adherence call.  Attempted to contact patient x 3 for medication review and health check, unable to reach patient, left voicemails to return call.    Care Gaps: Colonoscopy-05/14/14 Diabetic Foot Exam- Mammogram-09/10/13 Ophthalmology-NA Dexa Scan - 07/06/12 Annual Well Visit - NA Micro albumin-NA Hemoglobin A1c- 09/30/20  Star Rating  Drugs: None ID  Ethelene Hal Clinical Pharmacist Assistant [phone]

## 2020-12-23 NOTE — Telephone Encounter (Signed)
Yes, there is a new booster available which I recommend.

## 2020-12-23 NOTE — Telephone Encounter (Signed)
Sent pt a mychart message. 

## 2021-01-06 ENCOUNTER — Ambulatory Visit: Payer: Medicare HMO | Admitting: Neurology

## 2021-01-14 ENCOUNTER — Encounter: Payer: Self-pay | Admitting: Internal Medicine

## 2021-01-24 MED ORDER — DIAZEPAM 5 MG PO TABS: ORAL_TABLET | ORAL | 0 refills | Status: DC

## 2021-01-25 ENCOUNTER — Telehealth: Payer: Self-pay | Admitting: Internal Medicine

## 2021-01-25 ENCOUNTER — Other Ambulatory Visit: Payer: Self-pay | Admitting: Internal Medicine

## 2021-01-25 NOTE — Telephone Encounter (Signed)
Patient calling in  Says she needs a new referral submitted to [facility] for a brain MRI  Says she had appt scheduled but she had to cancel & when she called to r/s they advised her that a new referral would need to be placed

## 2021-01-25 NOTE — Telephone Encounter (Signed)
At this point she is going to need to come back in for evaluation - that  MRI was ordered in July

## 2021-01-26 NOTE — Telephone Encounter (Signed)
Message left for patient to schedule appointment.  Please schedule if she calls back.

## 2021-02-12 ENCOUNTER — Other Ambulatory Visit: Payer: Self-pay | Admitting: Internal Medicine

## 2021-02-15 ENCOUNTER — Telehealth: Payer: Self-pay | Admitting: Internal Medicine

## 2021-02-15 NOTE — Telephone Encounter (Signed)
N/A unable to leave a message for patient to call me back at [phone] to schedule Medicare Annual Wellness Visit   Last AWV  01/10/20  Please schedule at anytime with [facility] if patient calls the office back.    40 Minutes appointment   Any questions, please call me at [phone]

## 2021-02-17 DIAGNOSIS — F1021 Alcohol dependence, in remission: Secondary | ICD-10-CM | POA: Diagnosis not present

## 2021-02-17 DIAGNOSIS — F250 Schizoaffective disorder, bipolar type: Secondary | ICD-10-CM | POA: Diagnosis not present

## 2021-02-21 NOTE — Progress Notes (Signed)
[id number]      Subjective:    Patient ID: Tammy Stevens, female    DOB: 1945/11/17, 75 y.o.   MRN: [id number]  This visit occurred during the SARS-CoV-2 public health emergency.  Safety protocols were in place, including screening questions prior to the visit, additional usage of staff PPE, and extensive cleaning of exam room while observing appropriate contact time as indicated for disinfecting solutions.     HPI The patient is here for follow up of their chronic medical problems, including htn, CKD, hld, prediabetes.  She was here in July for recurrent falls/trouble walking and whole body exhaustion.  She is here with her daughter   Her medications have been decreased by 1/2 Gabapnetin, seroquel and lamictal and she is doing much better.  Her daughter did this and her symptoms above went away - she is doing much better.  She is still following with psychiatry.    BP not checking at home   She still has some weakness at times.  It is overall weakness.     No regular exercise  Medications and allergies reviewed with patient and updated if appropriate.  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   Episodic lightheadedness 06/11/2019   CKD (chronic kidney disease) stage 3, GFR 30-59 ml/min (HCC) 07/22/2018   Prediabetes 10/11/2017   Palpitations 07/28/2017   SOB (shortness of breath) on exertion 94/70/9628   Diastolic dysfunction 36/62/9476   Skin cancer 09/23/2015   Bipolar disorder ([place]) 02/09/2015   Cystocele 06/24/2013   Smokers' cough ([place]) 09/07/2010   FATTY LIVER DISEASE 02/18/2010   CHOLELITHIASIS, ASYMPTOMATIC 02/18/2010   ALCOHOL ABUSE, IN REMISSION 02/16/2010    IRRITABLE BOWEL SYNDROME 02/16/2010   GERD 02/11/2010   Hyperlipidemia 12/22/2009   OSA (obstructive sleep apnea) 12/22/2009   Depression 11/24/2009   Essential hypertension 11/24/2009    Current Outpatient Medications on File Prior to Visit  Medication Sig Dispense Refill   amLODipine (NORVASC) 5 MG tablet TAKE 1 TABLET BY MOUTH EVERY DAY 90 tablet 1   aspirin EC 81 MG tablet Take 81 mg by mouth daily.     gabapentin (NEURONTIN) 300 MG capsule Take 300 mg by mouth every evening. Pt takes 1 at night  1   hydrochlorothiazide (MICROZIDE) 12.5 MG capsule TAKE 1 CAPSULE BY MOUTH EVERY DAY 90 capsule 1   lamoTRIgine (LAMICTAL) 200 MG tablet Take 400 mg by mouth at bedtime.  1   Multiple Vitamin (MULTIVITAMIN) tablet Take 1 tablet by mouth daily.     QUEtiapine (SEROQUEL) 300 MG tablet Take 300 mg by mouth at bedtime. Will increase to 1 in the morning and 1 at bedtime starting 03/26/20     No current facility-administered medications on file prior to visit.    Past Medical History:  Diagnosis Date   Allergic rhinitis    Allergy    Bipolar disorder ([place])    Blood transfusion without reported diagnosis    as child   Cholelithiasis    See U/S 12/11   Depression    DX with Bipolar in the past    Depression    Diverticulosis    Dyspnea    Spirometry 09/26/2008 FEV1 1.85 (122%),  FVC 2.57(122%),  FEV1% 72   Elevated liver enzymes    Fatty liver    See U/S 12/11   GERD (gastroesophageal reflux disease)    History of alcohol abuse    Quit 2008   History of drug abuse ([place])    Quit 2008    Hyperlipemia    Hypertension    Peripheral neuropathy    Sleep apnea    on CPAP    Past Surgical History:  Procedure Laterality Date   BREAST LUMPECTOMY     BTL     COLONOSCOPY     SKIN GRAFT     after Maeley Matton as a child    TUBAL LIGATION      Social History   Socioeconomic History   Marital status: Divorced    Spouse name: Not on file   Number of children: 2   Years of  education: 12   Highest education level: Not on file  Occupational History   Occupation: retired     Fish farm manager: [employer]  Tobacco Use   Smoking status: Former    Types: Cigarettes    Start date: 1978    Quit date: 09/25/2019    Years since quitting: 1.4   Smokeless tobacco: Never   Tobacco comments:    very minimal  Vaping Use   Vaping Use: Former  Substance and Sexual Activity   Alcohol use: No    Alcohol/week: 0.0 standard drinks    Comment: Quit 2008   Drug use: No   Sexual activity: Not Currently  Other Topics Concern   Not on file  Social History Narrative   Right handed   1/2 cup coffee q morning, soda q afternoon   Social Determinants of Health   Financial Resource Strain: Not on file  Food Insecurity: Not on file  Transportation Needs: Not on file  Physical Activity: Not on file  Stress: Not on file  Social Connections: Not on file    Family History  Problem Relation Age of Onset   Diabetes Other        GM? (not specific)   Heart attack Other        GF? (not specific)   Asthma Son    Asthma Father    Alcohol abuse Father    Suicidality Father    Depression Father    Allergies Sister    Colon cancer Neg Hx    Esophageal cancer Neg Hx    Rectal cancer Neg Hx    Stomach cancer Neg Hx     Review of Systems  Constitutional:  Negative for fever.  Respiratory:  Negative for cough, shortness of breath and wheezing.   Cardiovascular:  Negative for chest pain, palpitations and leg swelling.  Neurological:  Positive for light-headedness (occ). Negative for dizziness and headaches.      Objective:   Vitals:   02/22/21 1501  BP: 116/78  Pulse: 92  Temp: 98.2 F (36.8 C)  SpO2: 98%   BP Readings from Last 3 Encounters:  02/22/21 116/78  11/10/20 124/74  10/21/20 110/76   Wt Readings from Last 3 Encounters:  02/22/21 158 lb 3.2 oz (71.8 kg)  11/10/20 161 lb (73 kg)  10/21/20 161 lb (73 kg)   Body mass index is 25.53 kg/m.   Physical  Exam    Constitutional: Appears well-developed and well-nourished. No distress.  HENT:  Head: Normocephalic and atraumatic.  Neck: Neck supple. No tracheal deviation present. No thyromegaly present.  No cervical lymphadenopathy Cardiovascular:  Normal rate, regular rhythm and normal heart sounds.   No murmur heard. No carotid bruit .  No edema Pulmonary/Chest: Effort normal and breath sounds normal. No respiratory distress. No has no wheezes. No rales.  Skin: Skin is warm and dry. Not diaphoretic.  Psychiatric: Normal mood and affect. Behavior is normal.      Assessment & Plan:    See Problem List for Assessment and Plan of chronic medical problems.

## 2021-02-22 ENCOUNTER — Ambulatory Visit (INDEPENDENT_AMBULATORY_CARE_PROVIDER_SITE_OTHER): Payer: Medicare HMO | Admitting: Internal Medicine

## 2021-02-22 ENCOUNTER — Ambulatory Visit (INDEPENDENT_AMBULATORY_CARE_PROVIDER_SITE_OTHER): Payer: Medicare HMO

## 2021-02-22 ENCOUNTER — Other Ambulatory Visit: Payer: Self-pay

## 2021-02-22 ENCOUNTER — Encounter: Payer: Self-pay | Admitting: Internal Medicine

## 2021-02-22 VITALS — BP 116/78 | HR 92 | Temp 98.2°F | Ht 66.0 in | Wt 158.2 lb

## 2021-02-22 VITALS — BP 116/78 | HR 92 | Temp 98.2°F | Ht 66.0 in | Wt 158.3 lb

## 2021-02-22 DIAGNOSIS — I10 Essential (primary) hypertension: Secondary | ICD-10-CM | POA: Diagnosis not present

## 2021-02-22 DIAGNOSIS — R7303 Prediabetes: Secondary | ICD-10-CM

## 2021-02-22 DIAGNOSIS — Z0000 Encounter for general adult medical examination without abnormal findings: Secondary | ICD-10-CM | POA: Diagnosis not present

## 2021-02-22 DIAGNOSIS — N1831 Chronic kidney disease, stage 3a: Secondary | ICD-10-CM | POA: Diagnosis not present

## 2021-02-22 MED ORDER — AMLODIPINE BESYLATE 5 MG PO TABS: 5.0000 mg | ORAL_TABLET | Freq: Every day | ORAL | 1 refills | Status: DC

## 2021-02-22 MED ORDER — HYDROCHLOROTHIAZIDE 12.5 MG PO CAPS: ORAL_CAPSULE | ORAL | 1 refills | Status: DC

## 2021-02-22 NOTE — Patient Instructions (Addendum)
° ° °  Blood work was ordered.  Have this done at the [facility] lab.    Medications changes include :  none   Your prescription(s) have been submitted to your pharmacy. Please take as directed and contact our office if you believe you are having problem(s) with the medication(s).    Please followup in 6 months

## 2021-02-22 NOTE — Assessment & Plan Note (Signed)
Chronic Stressed good fluid intake cmp

## 2021-02-22 NOTE — Assessment & Plan Note (Signed)
Chronic Check a1c Low sugar / carb diet Stressed regular exercise  
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
## 2021-02-22 NOTE — Assessment & Plan Note (Signed)
Chronic BP well controlled Advised to check bp at home Continue amlodipine 5 mg qd, hctz 12. 5 mg daily cmp

## 2021-04-23 ENCOUNTER — Other Ambulatory Visit: Payer: Self-pay | Admitting: Internal Medicine

## 2021-05-03 ENCOUNTER — Encounter: Payer: Self-pay | Admitting: Internal Medicine

## 2021-07-03 ENCOUNTER — Encounter: Payer: Self-pay | Admitting: Internal Medicine

## 2021-07-26 ENCOUNTER — Encounter: Payer: Self-pay | Admitting: Internal Medicine

## 2021-07-26 NOTE — Patient Instructions (Addendum)
     Blood work was ordered.     Medications changes include :   stop hydrochlorothizide    Return in about 6 months (around 01/27/2022) for follow up.

## 2021-07-26 NOTE — Progress Notes (Unsigned)
    Subjective:    Patient ID: Tammy Stevens, female    DOB: July 01, 1945, 76 y.o.   MRN: [id number]      HPI Tammy Stevens is here for No chief complaint on file.   Low BP, cold sweats, leg tingling x 2 weeks -      Medications and allergies reviewed with patient and updated if appropriate.  Current Outpatient Medications on File Prior to Visit  Medication Sig Dispense Refill   amLODipine (NORVASC) 5 MG tablet Take 1 tablet (5 mg total) by mouth daily. 90 tablet 1   aspirin EC 81 MG tablet Take 81 mg by mouth daily.     gabapentin (NEURONTIN) 300 MG capsule Take 300 mg by mouth every evening. Pt takes 1 at night  1   hydrochlorothiazide (MICROZIDE) 12.5 MG capsule TAKE 1 CAPSULE BY MOUTH EVERY DAY 90 capsule 1   lamoTRIgine (LAMICTAL) 200 MG tablet Take 400 mg by mouth at bedtime.  1   Multiple Vitamin (MULTIVITAMIN) tablet Take 1 tablet by mouth daily.     QUEtiapine (SEROQUEL) 300 MG tablet TAKE 1 TABLET BY MOUTH EVERY DAY IN THE MORNING AND 1 AT BEDTIME 180 tablet 4   No current facility-administered medications on file prior to visit.    Review of Systems     Objective:  There were no vitals filed for this visit. BP Readings from Last 3 Encounters:  02/22/21 116/78  02/22/21 116/78  11/10/20 124/74   Wt Readings from Last 3 Encounters:  02/22/21 158 lb 4.6 oz (71.8 kg)  02/22/21 158 lb 3.2 oz (71.8 kg)  11/10/20 161 lb (73 kg)   There is no height or weight on file to calculate BMI.    Physical Exam         Assessment & Plan:    See Problem List for Assessment and Plan of chronic medical problems.

## 2021-07-27 ENCOUNTER — Ambulatory Visit (INDEPENDENT_AMBULATORY_CARE_PROVIDER_SITE_OTHER): Payer: Medicare HMO | Admitting: Internal Medicine

## 2021-07-27 ENCOUNTER — Encounter: Payer: Self-pay | Admitting: Internal Medicine

## 2021-07-27 VITALS — BP 108/60 | HR 73 | Temp 98.8°F | Ht 66.0 in | Wt 156.0 lb

## 2021-07-27 DIAGNOSIS — R42 Dizziness and giddiness: Secondary | ICD-10-CM | POA: Diagnosis not present

## 2021-07-27 DIAGNOSIS — E559 Vitamin D deficiency, unspecified: Secondary | ICD-10-CM

## 2021-07-27 DIAGNOSIS — N1831 Chronic kidney disease, stage 3a: Secondary | ICD-10-CM

## 2021-07-27 DIAGNOSIS — R7303 Prediabetes: Secondary | ICD-10-CM | POA: Diagnosis not present

## 2021-07-27 DIAGNOSIS — I10 Essential (primary) hypertension: Secondary | ICD-10-CM

## 2021-07-27 DIAGNOSIS — R202 Paresthesia of skin: Secondary | ICD-10-CM

## 2021-07-27 LAB — COMPREHENSIVE METABOLIC PANEL
ALT: 26 U/L (ref 0–35)
AST: 26 U/L (ref 0–37)
Albumin: 4.5 g/dL (ref 3.5–5.2)
Alkaline Phosphatase: 85 U/L (ref 39–117)
BUN: 20 mg/dL (ref 6–23)
CO2: 32 mEq/L (ref 19–32)
Calcium: 11 mg/dL — ABNORMAL HIGH (ref 8.4–10.5)
Chloride: 97 mEq/L (ref 96–112)
Creatinine, Ser: 1.25 mg/dL — ABNORMAL HIGH (ref 0.40–1.20)
GFR: 42.35 mL/min — ABNORMAL LOW (ref >60.00)
Glucose, Bld: 102 mg/dL — ABNORMAL HIGH (ref 70–99)
Potassium: 3.9 mEq/L (ref 3.5–5.1)
Sodium: 136 mEq/L (ref 135–145)
Total Bilirubin: 0.3 mg/dL (ref 0.2–1.2)
Total Protein: 7.5 g/dL (ref 6.0–8.3)

## 2021-07-27 LAB — CBC WITH DIFFERENTIAL/PLATELET
Basophils Absolute: 0.1 10*3/uL (ref 0.0–0.1)
Basophils Relative: 0.6 % (ref 0.0–3.0)
Eosinophils Absolute: 0.3 10*3/uL (ref 0.0–0.7)
Eosinophils Relative: 3.2 % (ref 0.0–5.0)
HCT: 41.6 % (ref 36.0–46.0)
Hemoglobin: 13.6 g/dL (ref 12.0–15.0)
Lymphocytes Relative: 37.5 % (ref 12.0–46.0)
Lymphs Abs: 3.6 10*3/uL (ref 0.7–4.0)
MCHC: 32.7 g/dL (ref 30.0–36.0)
MCV: 86.5 fl (ref 78.0–100.0)
Monocytes Absolute: 0.7 10*3/uL (ref 0.1–1.0)
Monocytes Relative: 7.6 % (ref 3.0–12.0)
Neutro Abs: 5 10*3/uL (ref 1.4–7.7)
Neutrophils Relative %: 51.1 % (ref 43.0–77.0)
Platelets: 430 10*3/uL — ABNORMAL HIGH (ref 150.0–400.0)
RBC: 4.81 Mil/uL (ref 3.87–5.11)
RDW: 14.6 % (ref 11.5–15.5)
WBC: 9.7 10*3/uL (ref 4.0–10.5)

## 2021-07-27 LAB — VITAMIN B12: Vitamin B-12: >1504 pg/mL — ABNORMAL HIGH (ref 211–911)

## 2021-07-27 LAB — VITAMIN D 25 HYDROXY (VIT D DEFICIENCY, FRACTURES): VITD: 40.11 ng/mL (ref 30.00–100.00)

## 2021-07-27 LAB — HEMOGLOBIN A1C: Hgb A1c MFr Bld: 6.1 % (ref 4.6–6.5)

## 2021-07-27 NOTE — Assessment & Plan Note (Signed)
Chronic Taking vitamin D daily Check vitamin D level  

## 2021-07-27 NOTE — Assessment & Plan Note (Signed)
Chronic CMP today Advised making sure she gets enough fluids Blood pressure on the low side-discussed continued HCTZ

## 2021-07-27 NOTE — Assessment & Plan Note (Signed)
Chronic Check a1c Low sugar / carb diet Stressed regular exercise  

## 2021-07-27 NOTE — Assessment & Plan Note (Signed)
Chronic Blood pressure on the low side which could be causing the lightheadedness and maybe even the cold sweats Discontinue hydrochlorothiazide Continue amlodipine 5 mg daily Monitor BP CMP, CBC

## 2021-07-27 NOTE — Assessment & Plan Note (Signed)
Chronic She does mention tingling in her and one third of the way up her bilateral lower legs-this is not new She is taking gabapentin 300 mg at night and will continue that Likely neuropathy-has never had EMG and discussed that I could refer her to neurology to confirm Good pedal pulses so unlikely PAD B12 level has been normal in the past, but will recheck today

## 2021-07-27 NOTE — Assessment & Plan Note (Addendum)
Acute Has been experiencing lightheadedness and even some cold sweats Blood pressure has been on the low side and that may be the cause-medication adjusted We will check CBC, CMP No symptoms of infection or bleeding No changes in medications She does not always eat as best as she can fluid or eat regularly-encouraged healthy diet, regular eating Stressed good hydration Has had this in the past and did see cardiology at that time and work-up did not reveal a cardiac cause

## 2021-07-29 ENCOUNTER — Encounter: Payer: Self-pay | Admitting: Internal Medicine

## 2021-08-16 NOTE — Progress Notes (Deleted)
Virtual Visit via Video Note  I connected with Tammy Stevens on 08/16/21 at  1:40 PM EDT by a video enabled telemedicine application and verified that I am speaking with the correct person using two identifiers.   I discussed the limitations of evaluation and management by telemedicine and the availability of in person appointments. The patient expressed understanding and agreed to proceed.  Present for the visit:  Myself, Dr Billey Gosling, [name].  The patient is currently at home and I am in the office.    No referring provider.    History of Present Illness: This visit is for follow up of her symptoms from her last visit 07/27/21 - had symptoms for a couple of weeks ---- low BP, cold sweats, leg tingling, lightheadedness   Social History   Socioeconomic History   Marital status: Divorced    Spouse name: Not on file   Number of children: 2   Years of education: 12   Highest education level: Not on file  Occupational History   Occupation: retired     Fish farm manager: [employer]  Tobacco Use   Smoking status: Former    Types: Cigarettes    Start date: 1978    Quit date: 09/25/2019    Years since quitting: 1.8   Smokeless tobacco: Never   Tobacco comments:    very minimal  Vaping Use   Vaping Use: Former  Substance and Sexual Activity   Alcohol use: No    Alcohol/week: 0.0 standard drinks of alcohol    Comment: Quit 2008   Drug use: No   Sexual activity: Not Currently  Other Topics Concern   Not on file  Social History Narrative   Right handed   1/2 cup coffee q morning, soda q afternoon   Social Determinants of Health   Financial Resource Strain: Low Risk  (02/22/2021)   Overall Financial Resource Strain (CARDIA)    Difficulty of Paying Living Expenses: Not hard at all  Food Insecurity: No Food Insecurity (02/22/2021)   Hunger Vital Sign    Worried About Running Out of Food in the Last Year: Never true    [place] in the Last Year: Never true   Transportation Needs: No Transportation Needs (02/22/2021)   PRAPARE - Hydrologist (Medical): No    Lack of Transportation (Non-Medical): No  Physical Activity: Inactive (02/22/2021)   Exercise Vital Sign    Days of Exercise per Week: 0 days    Minutes of Exercise per Session: 0 min  Stress: No Stress Concern Present (02/22/2021)   [place]    Feeling of Stress : Not at all  Social Connections: Socially Isolated (02/22/2021)   Social Connection and Isolation Panel [NHANES]    Frequency of Communication with Friends and Family: More than three times a week    Frequency of Social Gatherings with Friends and Family: Once a week    Attends Religious Services: Never    Marine scientist or Organizations: No    Attends Archivist Meetings: Never    Marital Status: Never married     Observations/Objective: Appears well in NAD   Assessment and Plan:  See Problem List for Assessment and Plan of chronic medical problems.   Follow Up Instructions:    I discussed the assessment and treatment plan with the patient. The patient was provided an opportunity to ask questions and all were answered.  The patient agreed with the plan and demonstrated an understanding of the instructions.   The patient was advised to call back or seek an in-person evaluation if the symptoms worsen or if the condition fails to improve as anticipated.    [name], MD

## 2021-08-17 ENCOUNTER — Telehealth: Payer: Medicare HMO | Admitting: Internal Medicine

## 2021-10-28 ENCOUNTER — Other Ambulatory Visit: Payer: Self-pay | Admitting: Internal Medicine

## 2021-10-30 IMAGING — CT CT CHEST W/O CM
2 of 4 series · 15 of 36 positions shown, 18 images · non-contrast
Comparison: Chest radiograph 01/03/2020

CLINICAL DATA: Short of breath with exertion.

EXAM:
CT CHEST WITHOUT CONTRAST
TECHNIQUE: Multidetector CT imaging of the chest was performed following the
standard protocol without IV contrast.

[Series 2: thorax · axial · 0.66mm/px · z∈[-305,-35]mm · 12 of 161 slices shown, 15 images]
[im 13/161  mediastinal]
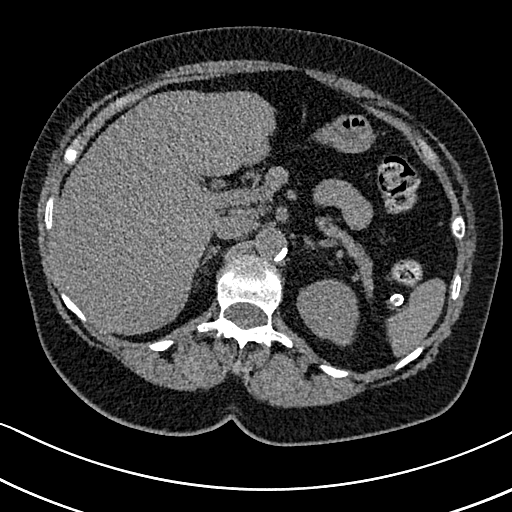
[im 13/161  lung]
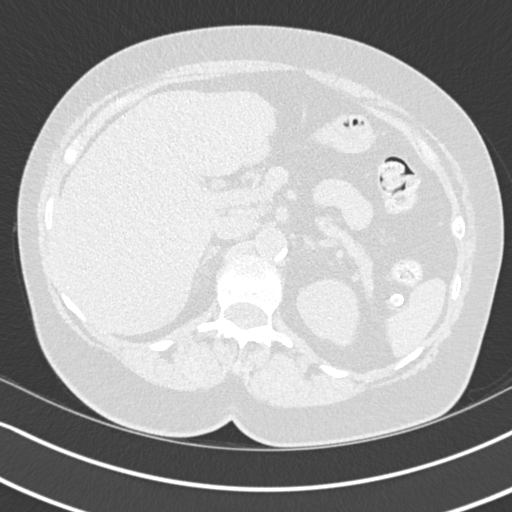
[im 25/161  lung]
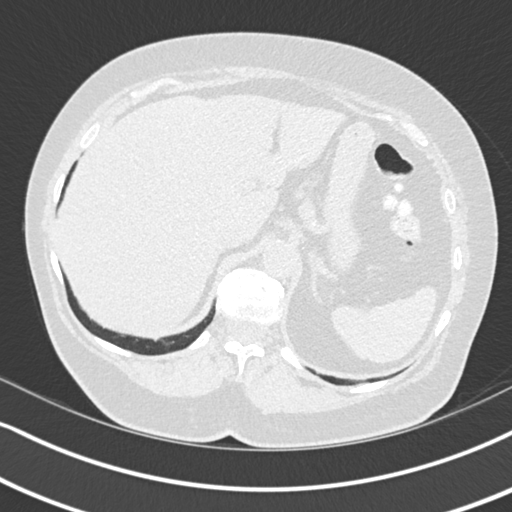
[im 37/161  lung]
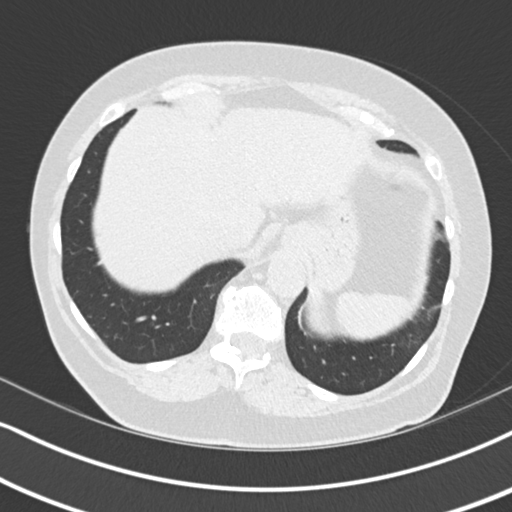
[im 50/161  lung]
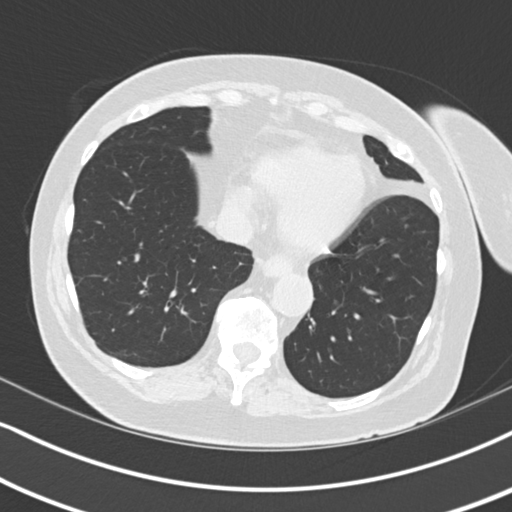
[im 62/161  mediastinal]
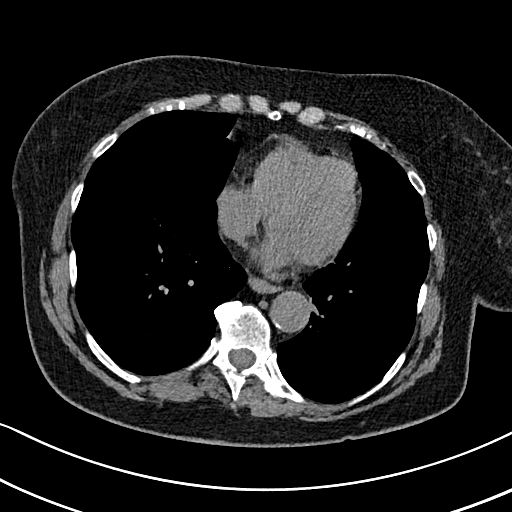
[im 62/161  lung]
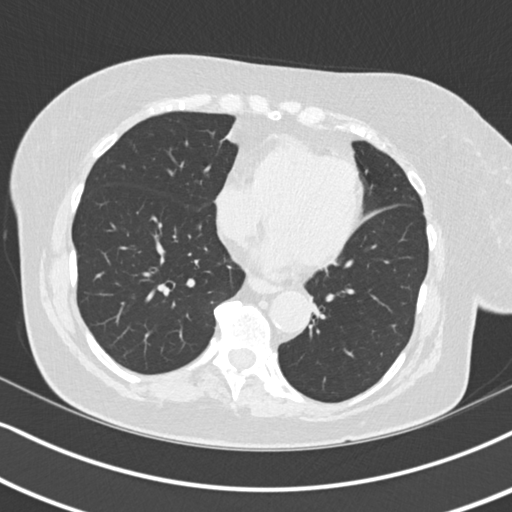
[im 74/161  lung]
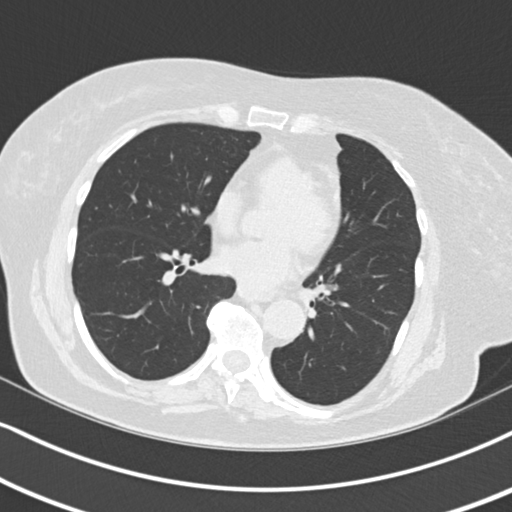
[im 87/161  lung]
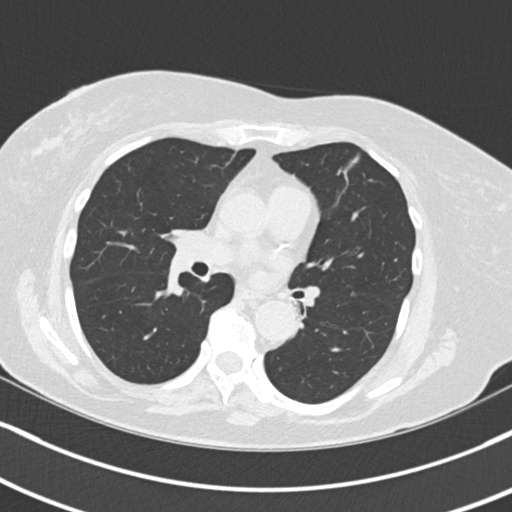
[im 99/161  lung]
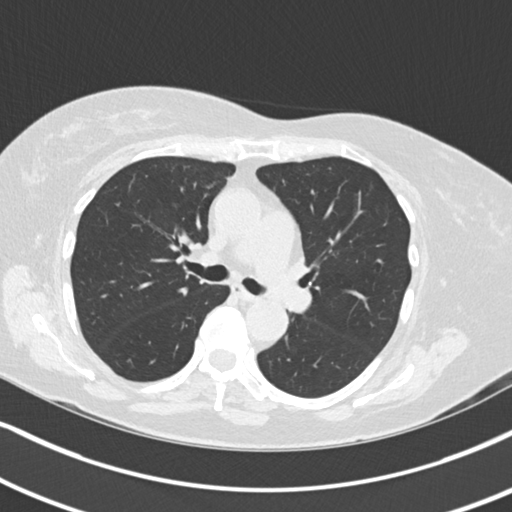
[im 111/161  mediastinal]
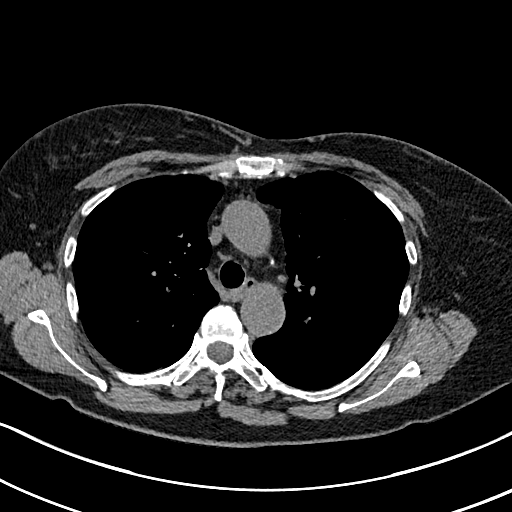
[im 111/161  lung]
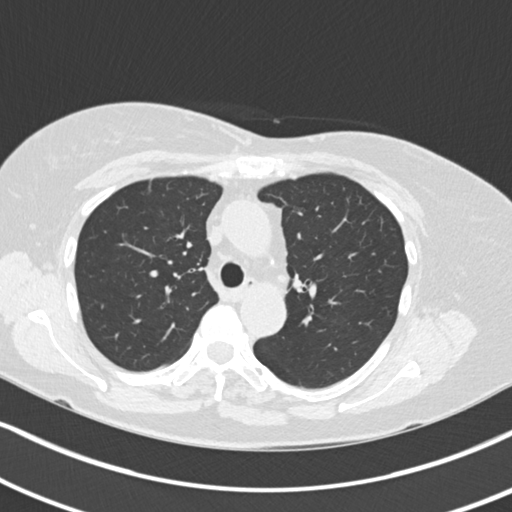
[im 124/161  lung]
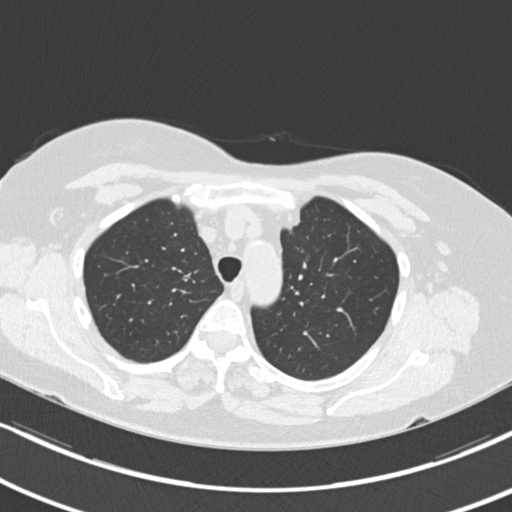
[im 136/161  lung]
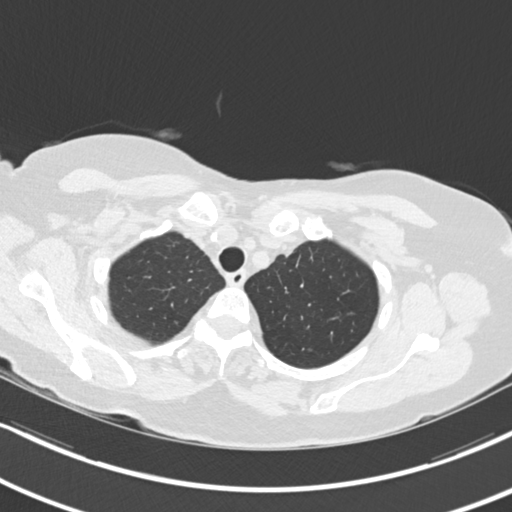
[im 148/161  lung]
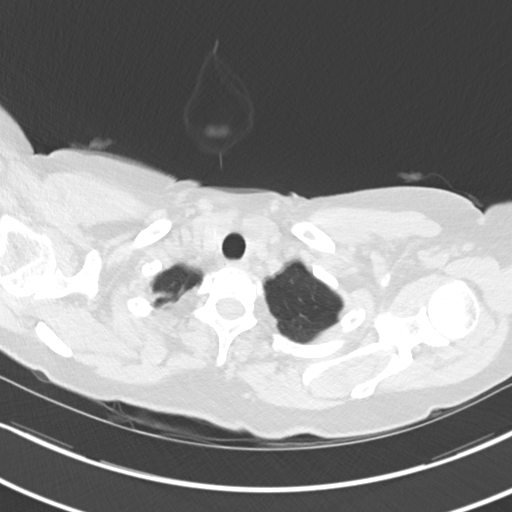

[Series 5: coronal · coronal · 0.56mm/px · 3 of 113 slices shown]
[im 23/113  lung]
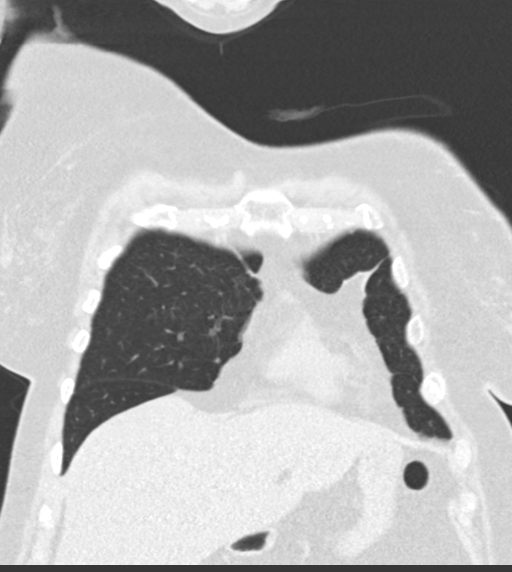
[im 45/113  lung]
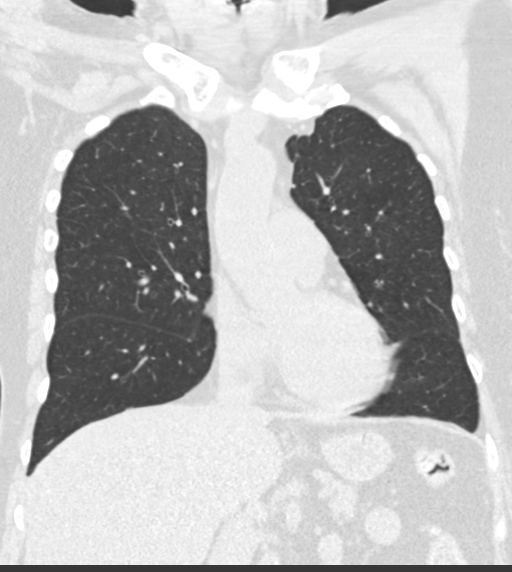
[im 68/113  lung]
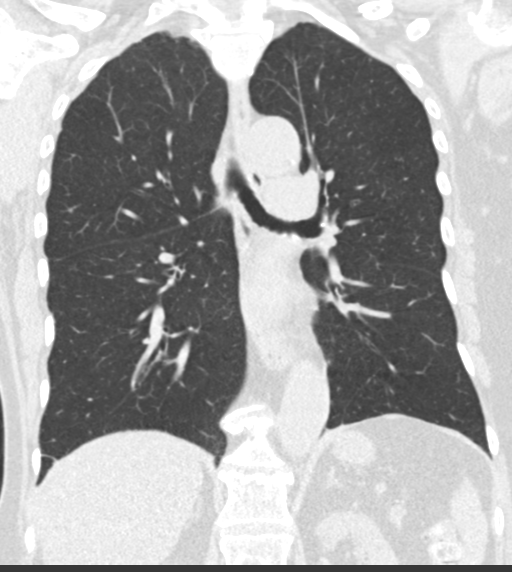

[15 of 36 positions shown; findings below may reference images not displayed]

FINDINGS: Cardiovascular: Coronary artery calcification and aortic
atherosclerotic calcification.

Mediastinum/Nodes: No axillary or supraclavicular adenopathy. No
mediastinal or hilar adenopathy. No pericardial fluid. Esophagus
normal.

Lungs/Pleura: No pleural effusion. No airspace disease. Airways are
normal. Small pulmonary nodule in the LEFT lung apex measures 3 mm
(image 16/series 3).

bland of pleural thickening in the medial aspect the lingula appears
chronic and benign. Similar thickening along the inferior aspect of
the LEFT oblique fissure.

Upper Abdomen: Limited view of the liver, kidneys, pancreas are
unremarkable. Normal adrenal glands.

Musculoskeletal: No aggressive osseous lesion.
IMPRESSION: 1. No acute pulmonary parenchymal findings.
2. Pleural thickening in the lingula appears chronic and benign.
3. Small LEFT upper lobe pulmonary nodule. No follow-up needed if
patient is low-risk. Non-contrast chest CT can be considered in 12
months if patient is high-risk. This recommendation follows the
consensus statement: Guidelines for Management of Incidental
Pulmonary Nodules Detected on CT Images: From the [HOSPITAL]
4. Coronary artery calcification and Aortic Atherosclerosis
([id number].O).

## 2021-12-02 ENCOUNTER — Encounter: Payer: Self-pay | Admitting: Internal Medicine

## 2021-12-02 NOTE — Progress Notes (Signed)
Subjective:    Patient ID: Tammy Stevens, female    DOB: 28-Oct-1945, 76 y.o.   MRN: [id number]      HPI Tammy Stevens is here for  Chief Complaint  Patient presents with   Cough    Pt stated--coughing, congestion, body ache, low fever, headache--6 days. BC powder, tylenol.    She is here for an acute visit for cold symptoms.   Her symptoms started one week ago  She is experiencing bad dry cough - occ productive but mostly dry.  She also states decreased appetite, fatigue, low-grade fever, occasional mild wheeze,  Headaches, lightheadedness and dizziness  She has tried taking  BC powder, tylenol, alka seltzer plus for cold and flu  She states frequent headaches and does take BC powder frequently.  Covid test was negative x 2 at home - Tuesday and Wednesday.    Medications and allergies reviewed with patient and updated if appropriate.  Current Outpatient Medications on File Prior to Visit  Medication Sig Dispense Refill   amLODipine (NORVASC) 5 MG tablet TAKE 1 TABLET (5 MG TOTAL) BY MOUTH DAILY. 90 tablet 1   aspirin EC 81 MG tablet Take 81 mg by mouth daily.     gabapentin (NEURONTIN) 300 MG capsule Take 300 mg by mouth every evening. Pt takes 1 at night  1   lamoTRIgine (LAMICTAL) 200 MG tablet Take 400 mg by mouth at bedtime.  1   Multiple Vitamin (MULTIVITAMIN) tablet Take 1 tablet by mouth daily.     QUEtiapine (SEROQUEL) 300 MG tablet TAKE 1 TABLET BY MOUTH EVERY DAY IN THE MORNING AND 1 AT BEDTIME 180 tablet 4   No current facility-administered medications on file prior to visit.    Review of Systems  Constitutional:  Positive for appetite change (decreased), fatigue and fever (low grade fever once in a while). Negative for chills.  HENT:  Positive for congestion (chronic - not worse). Negative for ear pain, postnasal drip, sinus pressure, sinus pain and sore throat.   Respiratory:  Positive for cough (primarily dry) and wheezing (some). Negative for chest tightness  and shortness of breath.   Gastrointestinal:  Negative for abdominal pain, diarrhea and nausea.  Musculoskeletal:  Positive for myalgias.  Neurological:  Positive for dizziness, light-headedness and headaches.       Objective:   Vitals:   12/03/21 1441  BP: (!) 144/82  Pulse: 74  Temp: 98.8 F (37.1 C)  SpO2: 94%   BP Readings from Last 3 Encounters:  12/03/21 (!) 144/82  07/27/21 108/60  02/22/21 116/78   Wt Readings from Last 3 Encounters:  12/03/21 161 lb (73 kg)  07/27/21 156 lb (70.8 kg)  02/22/21 158 lb 4.6 oz (71.8 kg)   Body mass index is 25.99 kg/m.    Physical Exam Constitutional:      General: She is not in acute distress.    Appearance: Normal appearance. She is not ill-appearing.  HENT:     Head: Normocephalic and atraumatic.     Right Ear: Tympanic membrane, ear canal and external ear normal.     Left Ear: Tympanic membrane, ear canal and external ear normal.     Mouth/Throat:     Mouth: Mucous membranes are moist.     Pharynx: No oropharyngeal exudate or posterior oropharyngeal erythema.  Eyes:     Conjunctiva/sclera: Conjunctivae normal.  Cardiovascular:     Rate and Rhythm: Normal rate and regular rhythm.  Pulmonary:     Effort: Pulmonary  effort is normal. No respiratory distress.     Breath sounds: Normal breath sounds. No wheezing or rales.  Musculoskeletal:     Cervical back: Neck supple. No tenderness.  Lymphadenopathy:     Cervical: No cervical adenopathy.  Skin:    General: Skin is warm and dry.  Neurological:     Mental Status: She is alert.            Assessment & Plan:     See Problem List for Assessment and Plan of chronic medical problems.

## 2021-12-03 ENCOUNTER — Ambulatory Visit (INDEPENDENT_AMBULATORY_CARE_PROVIDER_SITE_OTHER): Payer: Medicare HMO | Admitting: Internal Medicine

## 2021-12-03 ENCOUNTER — Encounter: Payer: Self-pay | Admitting: Internal Medicine

## 2021-12-03 DIAGNOSIS — N1831 Chronic kidney disease, stage 3a: Secondary | ICD-10-CM

## 2021-12-03 DIAGNOSIS — J069 Acute upper respiratory infection, unspecified: Secondary | ICD-10-CM | POA: Insufficient documentation

## 2021-12-03 DIAGNOSIS — I10 Essential (primary) hypertension: Secondary | ICD-10-CM | POA: Diagnosis not present

## 2021-12-03 MED ORDER — HYDROCODONE BIT-HOMATROP MBR 5-1.5 MG/5ML PO SOLN: 5.0000 mL | Freq: Three times a day (TID) | ORAL | 0 refills | Status: AC | PRN

## 2021-12-03 NOTE — Patient Instructions (Addendum)
You can take coricidin cold products.   Use the cough syrup as needed.    Please call if there is no improvement in your symptoms.    Return in about 3 months (around 03/04/2022) for follow up.      Upper Respiratory Infection, Adult An upper respiratory infection (URI) is a common viral infection of the nose, throat, and upper air passages that lead to the lungs. The most common type of URI is the common cold. URIs usually get better on their own, without medical treatment. What are the causes? A URI is caused by a virus. You may catch a virus by: Breathing in droplets from an infected person's cough or sneeze. Touching something that has been exposed to the virus (is contaminated) and then touching your mouth, nose, or eyes. What increases the risk? You are more likely to get a URI if: You are very young or very old. You have close contact with others, such as at work, school, or a health care facility. You smoke. You have long-term (chronic) heart or lung disease. You have a weakened disease-fighting system (immune system). You have nasal allergies or asthma. You are experiencing a lot of stress. You have poor nutrition. What are the signs or symptoms? A URI usually involves some of the following symptoms: Runny or stuffy (congested) nose. Cough. Sneezing. Sore throat. Headache. Fatigue. Fever. Loss of appetite. Pain in your forehead, behind your eyes, and over your cheekbones (sinus pain). Muscle aches. Redness or irritation of the eyes. Pressure in the ears or face. How is this diagnosed? This condition may be diagnosed based on your medical history and symptoms, and a physical exam. Your health care provider may use a swab to take a mucus sample from your nose (nasal swab). This sample can be tested to determine what virus is causing the illness. How is this treated? URIs usually get better on their own within 7-10 days. Medicines cannot cure URIs, but your  health care provider may recommend certain medicines to help relieve symptoms, such as: Over-the-counter cold medicines. Cough suppressants. Coughing is a type of defense against infection that helps to clear the respiratory system, so take these medicines only as recommended by your health care provider. Fever-reducing medicines. Follow these instructions at home: Activity Rest as needed. If you have a fever, stay home from work or school until your fever is gone or until your health care provider says your URI cannot spread to other people (is no longer contagious). Your health care provider may have you wear a face mask to prevent your infection from spreading. Relieving symptoms Gargle with a mixture of salt and water 3-4 times a day or as needed. To make salt water, completely dissolve -1 tsp (3-6 g) of salt in 1 cup (237 mL) of warm water. Use a cool-mist humidifier to add moisture to the air. This can help you breathe more easily. Eating and drinking  Drink enough fluid to keep your urine pale yellow. Eat soups and other clear broths. General instructions  Take over-the-counter and prescription medicines only as told by your health care provider. These include cold medicines, fever reducers, and cough suppressants. Do not use any products that contain nicotine or tobacco. These products include cigarettes, chewing tobacco, and vaping devices, such as e-cigarettes. If you need help quitting, ask your health care provider. Stay away from secondhand smoke. Stay up to date on all immunizations, including the yearly (annual) flu vaccine. Keep all follow-up visits. This is  important. How to prevent the spread of infection to others URIs can be contagious. To prevent the infection from spreading: Wash your hands with soap and water for at least 20 seconds. If soap and water are not available, use hand sanitizer. Avoid touching your mouth, face, eyes, or nose. Cough or sneeze into a tissue  or your sleeve or elbow instead of into your hand or into the air.  Contact a health care provider if: You are getting worse instead of better. You have a fever or chills. Your mucus is brown or red. You have yellow or brown discharge coming from your nose. You have pain in your face, especially when you bend forward. You have swollen neck glands. You have pain while swallowing. You have white areas in the back of your throat. Get help right away if: You have shortness of breath that gets worse. You have severe or persistent: Headache. Ear pain. Sinus pain. Chest pain. You have chronic lung disease along with any of the following: Making high-pitched whistling sounds when you breathe, most often when you breathe out (wheezing). Prolonged cough (more than 14 days). Coughing up blood. A change in your usual mucus. You have a stiff neck. You have changes in your: Vision. Hearing. Thinking. Mood. These symptoms may be an emergency. Get help right away. Call 911. Do not wait to see if the symptoms will go away. Do not drive yourself to the hospital. Summary An upper respiratory infection (URI) is a common infection of the nose, throat, and upper air passages that lead to the lungs. A URI is caused by a virus. URIs usually get better on their own within 7-10 days. Medicines cannot cure URIs, but your health care provider may recommend certain medicines to help relieve symptoms. This information is not intended to replace advice given to you by your health care provider. Make sure you discuss any questions you have with your health care provider. Document Revised: 09/23/2020 Document Reviewed: 09/23/2020 Elsevier Patient Education  Tammy Stevens.

## 2021-12-03 NOTE — Assessment & Plan Note (Signed)
Chronic Stressed that she cannot take BC powder and should not take Advil, Aleve, ibuprofen, aspirin and high doses for pain reliever Advised Tylenol only

## 2021-12-03 NOTE — Assessment & Plan Note (Signed)
Acute Symptoms likely viral in nature COVID test x2 at home was negative Continue symptomatic treatment with over-the-counter cold medications, Tylenol/ibuprofen Will prescribe Hycodan cough syrup so that she is able to sleep a little bit better with the cough Increase rest and fluids Call if symptoms worsen or do not improve

## 2021-12-03 NOTE — Assessment & Plan Note (Signed)
Chronic Blood pressure slightly elevated today, which is likely secondary to being sick Last 2 blood pressure measures have been very good No change in medications Continue amlodipine 5 mg daily

## 2021-12-10 DIAGNOSIS — F250 Schizoaffective disorder, bipolar type: Secondary | ICD-10-CM | POA: Diagnosis not present

## 2021-12-10 DIAGNOSIS — F1021 Alcohol dependence, in remission: Secondary | ICD-10-CM | POA: Diagnosis not present

## 2021-12-13 DIAGNOSIS — L820 Inflamed seborrheic keratosis: Secondary | ICD-10-CM | POA: Diagnosis not present

## 2021-12-13 DIAGNOSIS — L821 Other seborrheic keratosis: Secondary | ICD-10-CM | POA: Diagnosis not present

## 2021-12-13 DIAGNOSIS — Z85828 Personal history of other malignant neoplasm of skin: Secondary | ICD-10-CM | POA: Diagnosis not present

## 2021-12-13 DIAGNOSIS — D485 Neoplasm of uncertain behavior of skin: Secondary | ICD-10-CM | POA: Diagnosis not present

## 2021-12-13 DIAGNOSIS — C44612 Basal cell carcinoma of skin of right upper limb, including shoulder: Secondary | ICD-10-CM | POA: Diagnosis not present

## 2022-01-07 ENCOUNTER — Encounter: Payer: Self-pay | Admitting: Internal Medicine

## 2022-01-08 MED ORDER — ERYTHROMYCIN 5 MG/GM OP OINT: 1.0000 | TOPICAL_OINTMENT | Freq: Every day | OPHTHALMIC | 0 refills | Status: DC

## 2022-02-16 DIAGNOSIS — H35453 Secondary pigmentary degeneration, bilateral: Secondary | ICD-10-CM | POA: Diagnosis not present

## 2022-02-16 DIAGNOSIS — Z961 Presence of intraocular lens: Secondary | ICD-10-CM | POA: Diagnosis not present

## 2022-02-16 DIAGNOSIS — H43813 Vitreous degeneration, bilateral: Secondary | ICD-10-CM | POA: Diagnosis not present

## 2022-02-16 DIAGNOSIS — H25812 Combined forms of age-related cataract, left eye: Secondary | ICD-10-CM | POA: Diagnosis not present

## 2022-02-16 DIAGNOSIS — H35363 Drusen (degenerative) of macula, bilateral: Secondary | ICD-10-CM | POA: Diagnosis not present

## 2022-02-16 DIAGNOSIS — H35722 Serous detachment of retinal pigment epithelium, left eye: Secondary | ICD-10-CM | POA: Diagnosis not present

## 2022-02-25 ENCOUNTER — Ambulatory Visit: Payer: Medicare HMO

## 2022-04-26 ENCOUNTER — Other Ambulatory Visit: Payer: Self-pay | Admitting: Internal Medicine

## 2022-04-26 ENCOUNTER — Other Ambulatory Visit: Payer: Self-pay

## 2022-10-29 ENCOUNTER — Other Ambulatory Visit: Payer: Self-pay | Admitting: Internal Medicine

## 2022-11-09 ENCOUNTER — Other Ambulatory Visit: Payer: Self-pay | Admitting: Internal Medicine

## 2022-11-09 DIAGNOSIS — H04123 Dry eye syndrome of bilateral lacrimal glands: Secondary | ICD-10-CM | POA: Diagnosis not present

## 2022-11-09 DIAGNOSIS — T1511XA Foreign body in conjunctival sac, right eye, initial encounter: Secondary | ICD-10-CM | POA: Diagnosis not present

## 2022-11-09 DIAGNOSIS — T1512XA Foreign body in conjunctival sac, left eye, initial encounter: Secondary | ICD-10-CM | POA: Diagnosis not present

## 2022-12-11 ENCOUNTER — Encounter: Payer: Self-pay | Admitting: Internal Medicine

## 2022-12-11 NOTE — Progress Notes (Unsigned)
Subjective:    Patient ID: Tammy Stevens, female    DOB: 04/29/1945, 76 y.o.   MRN: [id number]      HPI [name] is here for No chief complaint on file.    BP concerns -    ? ekg   Medications and allergies reviewed with patient and updated if appropriate.  Current Outpatient Medications on File Prior to Visit  Medication Sig Dispense Refill   amLODipine (NORVASC) 5 MG tablet TAKE 1 TABLET (5 MG TOTAL) BY MOUTH DAILY. 30 tablet 0   aspirin EC 81 MG tablet Take 81 mg by mouth daily.     erythromycin ophthalmic ointment Place 1 Application into the left eye at bedtime. 3.5 g 0   gabapentin (NEURONTIN) 300 MG capsule Take 300 mg by mouth every evening. Pt takes 1 at night  1   lamoTRIgine (LAMICTAL) 200 MG tablet Take 400 mg by mouth at bedtime.  1   Multiple Vitamin (MULTIVITAMIN) tablet Take 1 tablet by mouth daily.     QUEtiapine (SEROQUEL) 300 MG tablet TAKE 1 TABLET BY MOUTH EVERY DAY IN THE MORNING AND 1 AT BEDTIME 180 tablet 4   No current facility-administered medications on file prior to visit.    Review of Systems     Objective:  There were no vitals filed for this visit. BP Readings from Last 3 Encounters:  12/03/21 (!) 144/82  07/27/21 108/60  02/22/21 116/78   Wt Readings from Last 3 Encounters:  12/03/21 161 lb (73 kg)  07/27/21 156 lb (70.8 kg)  02/22/21 158 lb 4.6 oz (71.8 kg)   There is no height or weight on file to calculate BMI.    Physical Exam         Assessment & Plan:    See Problem List for Assessment and Plan of chronic medical problems.

## 2022-12-11 NOTE — Patient Instructions (Incomplete)
     An EKG was done today.    Blood work was ordered.   The lab is on the first floor.    Medications changes include :   none       Return in about 6 months (around 06/12/2023) for follow up, sooner if symptoms do not improve.

## 2022-12-12 ENCOUNTER — Ambulatory Visit (INDEPENDENT_AMBULATORY_CARE_PROVIDER_SITE_OTHER): Payer: Medicare HMO | Admitting: Internal Medicine

## 2022-12-12 ENCOUNTER — Other Ambulatory Visit: Payer: Self-pay | Admitting: Internal Medicine

## 2022-12-12 VITALS — BP 110/66 | HR 68 | Temp 98.6°F | Ht 66.0 in | Wt 165.0 lb

## 2022-12-12 DIAGNOSIS — I10 Essential (primary) hypertension: Secondary | ICD-10-CM | POA: Diagnosis not present

## 2022-12-12 DIAGNOSIS — R002 Palpitations: Secondary | ICD-10-CM | POA: Diagnosis not present

## 2022-12-12 DIAGNOSIS — E7849 Other hyperlipidemia: Secondary | ICD-10-CM

## 2022-12-12 DIAGNOSIS — N1831 Chronic kidney disease, stage 3a: Secondary | ICD-10-CM | POA: Diagnosis not present

## 2022-12-12 DIAGNOSIS — R7303 Prediabetes: Secondary | ICD-10-CM

## 2022-12-12 LAB — CBC WITH DIFFERENTIAL/PLATELET
Basophils Absolute: 0.1 10*3/uL (ref 0.0–0.1)
Basophils Relative: 0.8 % (ref 0.0–3.0)
Eosinophils Absolute: 0.2 10*3/uL (ref 0.0–0.7)
Eosinophils Relative: 2.3 % (ref 0.0–5.0)
HCT: 39.9 % (ref 36.0–46.0)
Hemoglobin: 12.8 g/dL (ref 12.0–15.0)
Lymphocytes Relative: 45.2 % (ref 12.0–46.0)
Lymphs Abs: 4.3 10*3/uL — ABNORMAL HIGH (ref 0.7–4.0)
MCHC: 32 g/dL (ref 30.0–36.0)
MCV: 87.3 fL (ref 78.0–100.0)
Monocytes Absolute: 1 10*3/uL (ref 0.1–1.0)
Monocytes Relative: 10.3 % (ref 3.0–12.0)
Neutro Abs: 3.9 10*3/uL (ref 1.4–7.7)
Neutrophils Relative %: 41.4 % — ABNORMAL LOW (ref 43.0–77.0)
Platelets: 420 10*3/uL — ABNORMAL HIGH (ref 150.0–400.0)
RBC: 4.57 Mil/uL (ref 3.87–5.11)
RDW: 14.8 % (ref 11.5–15.5)
WBC: 9.5 10*3/uL (ref 4.0–10.5)

## 2022-12-12 LAB — HEMOGLOBIN A1C: Hgb A1c MFr Bld: 6.1 % (ref 4.6–6.5)

## 2022-12-12 LAB — TSH: TSH: 0.83 u[IU]/mL (ref 0.35–5.50)

## 2022-12-12 NOTE — Assessment & Plan Note (Signed)
Chronic Check lipid panel  Continue crestor 10 mg daily Regular exercise and healthy diet encouraged-discussed increasing exercise that she is doing which is mostly walking inside her house and ways to improve her diet

## 2022-12-12 NOTE — Assessment & Plan Note (Signed)
Chronic She is currently eating a very high sugar diet which may be causing some of her symptoms Check a1c Low sugar / carb diet stressed Stressed regular exercise

## 2022-12-12 NOTE — Assessment & Plan Note (Signed)
Chronic Blood pressure varied at home from 102/66-1 58/96 Does not seem to be in obvious pattern States she is taking amlodipine 5 mg daily at the same time every day She is a very poor diet and may not be staying hydrated which could influence this Noncompliant with low-sodium diet EKG today normal sinus rhythm at 65 bpm, possible LAE, otherwise normal EKG.  Compared to last EKG from 01/2020 there is no significant change.

## 2022-12-12 NOTE — Assessment & Plan Note (Signed)
Has episodes of palpitations intermittently Couple of years ago did have Holter monitor did not reveal any abnormal rhythms Palpitations could be multifactorial Check blood work If palpitations continue can consider another Holter monitor EKG: NSR at 65 bpm, possible left atrial enlargement, otherwise normal EKG

## 2022-12-12 NOTE — Assessment & Plan Note (Signed)
Chronic CMP, CBC Stressed good hydration Need to get blood pressure better controlled-it is greatly variable at home

## 2022-12-13 LAB — COMPREHENSIVE METABOLIC PANEL
ALT: 32 U/L (ref 0–35)
AST: 29 U/L (ref 0–37)
Albumin: 4.2 g/dL (ref 3.5–5.2)
Alkaline Phosphatase: 94 U/L (ref 39–117)
BUN: 22 mg/dL (ref 6–23)
CO2: 26 meq/L (ref 19–32)
Calcium: 10.7 mg/dL — ABNORMAL HIGH (ref 8.4–10.5)
Chloride: 101 meq/L (ref 96–112)
Creatinine, Ser: 1.17 mg/dL (ref 0.40–1.20)
GFR: 45.41 mL/min — ABNORMAL LOW (ref >60.00)
Glucose, Bld: 75 mg/dL (ref 70–99)
Potassium: 4.1 meq/L (ref 3.5–5.1)
Sodium: 137 meq/L (ref 135–145)
Total Bilirubin: 0.3 mg/dL (ref 0.2–1.2)
Total Protein: 7.2 g/dL (ref 6.0–8.3)

## 2022-12-13 LAB — LIPID PANEL
Cholesterol: 235 mg/dL — ABNORMAL HIGH (ref 0–200)
HDL: 52.5 mg/dL (ref >39.00)
LDL Cholesterol: 136 mg/dL — ABNORMAL HIGH (ref 0–99)
NonHDL: 182.51
Total CHOL/HDL Ratio: 4
Triglycerides: 235 mg/dL — ABNORMAL HIGH (ref 0.0–149.0)
VLDL: 47 mg/dL — ABNORMAL HIGH (ref 0.0–40.0)

## 2022-12-30 DIAGNOSIS — F4312 Post-traumatic stress disorder, chronic: Secondary | ICD-10-CM | POA: Diagnosis not present

## 2022-12-30 DIAGNOSIS — F411 Generalized anxiety disorder: Secondary | ICD-10-CM | POA: Diagnosis not present

## 2022-12-30 DIAGNOSIS — F319 Bipolar disorder, unspecified: Secondary | ICD-10-CM | POA: Diagnosis not present

## 2023-01-31 DIAGNOSIS — F319 Bipolar disorder, unspecified: Secondary | ICD-10-CM | POA: Diagnosis not present

## 2023-01-31 DIAGNOSIS — F4312 Post-traumatic stress disorder, chronic: Secondary | ICD-10-CM | POA: Diagnosis not present

## 2023-01-31 DIAGNOSIS — F411 Generalized anxiety disorder: Secondary | ICD-10-CM | POA: Diagnosis not present

## 2023-03-14 ENCOUNTER — Telehealth: Payer: Self-pay

## 2023-03-14 NOTE — Telephone Encounter (Signed)
 Copied from CRM [id number]. Topic: Clinical - Medical Advice >> Mar 14, 2023  4:09 PM Tammy Stevens wrote:  Reason for CRM: Patient has been dealing with acid reflex for the last month on and off and she was wondering if there is anything she can take.

## 2023-03-15 NOTE — Telephone Encounter (Signed)
 Can try otc pepcid - if gerd not improving should come in

## 2023-03-15 NOTE — Telephone Encounter (Signed)
Message left today with recommendations. 

## 2023-03-22 ENCOUNTER — Ambulatory Visit: Payer: Self-pay | Admitting: Internal Medicine

## 2023-03-22 NOTE — Telephone Encounter (Signed)
 Patient scheduled at [facility] for tomorrow at 1:00 pm.

## 2023-03-22 NOTE — Telephone Encounter (Signed)
  Chief Complaint: Rectal bleeding, black stool Symptoms: Abd pain, black stools Frequency: Ongoing x 3 weeks Pertinent Negatives: Patient denies fever, N/V Disposition: [x] ED /[] Urgent Care (no appt availability in office) / [] Appointment(In office/virtual)/ []  [facility]/ [] Home Care/ [] Refused Recommended Disposition /[] [facility]/ []  Follow-up with PCP Additional Notes: Pt reports she has been experiencing black stools, intermittent rectal bleeding when wiping and abd pain that has been increasing in frequency and intensity. Pt denies fever, N/V. Pt declines ED recommendation and states she would like an appt instead. This RN advised message would be forwarded to the provider for review. Instructed pt to return call to office with new-worsening symptoms or concerns. Pt verbalized understanding and agrees to plan. Notified CAL of ED refusal.   Copied from CRM [id number]. Topic: Clinical - Red Word Triage >> Mar 22, 2023  3:40 PM Corin V wrote: [facility] that prompted transfer to Nurse Triage: Patient has been having black stool with periodic bleeding. It started about 3 weeks ago and has increased in frequency. Reason for Disposition  Black or tarry bowel movements  (Exception: Chronic-unchanged black-grey BMs AND is taking iron pills or Pepto-Bismol.)  Answer Assessment - Initial Assessment Questions 1. COLOR: "What color is it?" "Is that color in part or all of the stool?"     Black 2. ONSET: "When was the unusual color first noted?"     About 3 weeks ago or more 3. CAUSE: "Have you eaten any food or taken any medicine of this color?" Note: See listing in Background Information section.      None 4. OTHER SYMPTOMS: "Do you have any other symptoms?" (e.g., abdomen pain, diarrhea, jaundice, fever).     Abd pain, "really crampy pain"  Answer Assessment - Initial Assessment Questions 1. APPEARANCE of BLOOD: "What color is it?" "Is it passed separately, on the surface  of the stool, or mixed in with the stool?"      Black 2. AMOUNT: "How much blood was passed?"      Unknown 3. FREQUENCY: "How many times has blood been passed with the stools?"      Consistently x 3 weeks 4. ONSET: "When was the blood first seen in the stools?" (Days or weeks)      About 3 weeks or more ago 5. DIARRHEA: "Is there also some diarrhea?" If Yes, ask: "How many diarrhea stools in the past 24 hours?"      None 6. CONSTIPATION: "Do you have constipation?" If Yes, ask: "How bad is it?"     Does take Miralax when needed 7. RECURRENT SYMPTOMS: "Have you had blood in your stools before?" If Yes, ask: "When was the last time?" and "What happened that time?"      No 8. BLOOD THINNERS: "Do you take any blood thinners?" (e.g., Coumadin/warfarin, Pradaxa/dabigatran, aspirin)     Baby aspirin 9. OTHER SYMPTOMS: "Do you have any other symptoms?"  (e.g., abdomen pain, vomiting, dizziness, fever)     Abd pain  Protocols used: Stools - Unusual Color-A-AH, Rectal Bleeding-A-AH

## 2023-03-23 ENCOUNTER — Ambulatory Visit (INDEPENDENT_AMBULATORY_CARE_PROVIDER_SITE_OTHER): Payer: Medicare HMO | Admitting: Family

## 2023-03-23 ENCOUNTER — Encounter: Payer: Self-pay | Admitting: Family

## 2023-03-23 VITALS — BP 125/72 | HR 76 | Temp 98.0°F | Ht 66.0 in | Wt 162.2 lb

## 2023-03-23 DIAGNOSIS — K921 Melena: Secondary | ICD-10-CM | POA: Diagnosis not present

## 2023-03-23 LAB — CBC WITH DIFFERENTIAL/PLATELET
Basophils Absolute: 0.1 10*3/uL (ref 0.0–0.1)
Basophils Relative: 0.8 % (ref 0.0–3.0)
Eosinophils Absolute: 0.2 10*3/uL (ref 0.0–0.7)
Eosinophils Relative: 2.3 % (ref 0.0–5.0)
HCT: 40.5 % (ref 36.0–46.0)
Hemoglobin: 13.1 g/dL (ref 12.0–15.0)
Lymphocytes Relative: 46.8 % — ABNORMAL HIGH (ref 12.0–46.0)
Lymphs Abs: 4.8 10*3/uL — ABNORMAL HIGH (ref 0.7–4.0)
MCHC: 32.2 g/dL (ref 30.0–36.0)
MCV: 87.7 fL (ref 78.0–100.0)
Monocytes Absolute: 1.1 10*3/uL — ABNORMAL HIGH (ref 0.1–1.0)
Monocytes Relative: 10.5 % (ref 3.0–12.0)
Neutro Abs: 4.1 10*3/uL (ref 1.4–7.7)
Neutrophils Relative %: 39.6 % — ABNORMAL LOW (ref 43.0–77.0)
Platelets: 427 10*3/uL — ABNORMAL HIGH (ref 150.0–400.0)
RBC: 4.62 Mil/uL (ref 3.87–5.11)
RDW: 14.6 % (ref 11.5–15.5)
WBC: 10.2 10*3/uL (ref 4.0–10.5)
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
## 2023-03-27 NOTE — Progress Notes (Signed)
Let Tammy Stevens know her blood count is fine, no signs of infection. Thx

## 2023-04-25 DIAGNOSIS — F411 Generalized anxiety disorder: Secondary | ICD-10-CM | POA: Diagnosis not present

## 2023-04-25 DIAGNOSIS — F4312 Post-traumatic stress disorder, chronic: Secondary | ICD-10-CM | POA: Diagnosis not present

## 2023-04-25 DIAGNOSIS — F319 Bipolar disorder, unspecified: Secondary | ICD-10-CM | POA: Diagnosis not present

## 2023-05-14 ENCOUNTER — Other Ambulatory Visit: Payer: Self-pay | Admitting: Internal Medicine

## 2023-05-16 ENCOUNTER — Encounter: Payer: Self-pay | Admitting: Internal Medicine

## 2023-06-05 DIAGNOSIS — Z85828 Personal history of other malignant neoplasm of skin: Secondary | ICD-10-CM | POA: Diagnosis not present

## 2023-06-05 DIAGNOSIS — D485 Neoplasm of uncertain behavior of skin: Secondary | ICD-10-CM | POA: Diagnosis not present

## 2023-06-05 DIAGNOSIS — C44519 Basal cell carcinoma of skin of other part of trunk: Secondary | ICD-10-CM | POA: Diagnosis not present

## 2023-06-05 DIAGNOSIS — L820 Inflamed seborrheic keratosis: Secondary | ICD-10-CM | POA: Diagnosis not present

## 2023-06-28 DIAGNOSIS — H25012 Cortical age-related cataract, left eye: Secondary | ICD-10-CM | POA: Diagnosis not present

## 2023-06-28 DIAGNOSIS — T1512XA Foreign body in conjunctival sac, left eye, initial encounter: Secondary | ICD-10-CM | POA: Diagnosis not present

## 2023-06-28 DIAGNOSIS — H26491 Other secondary cataract, right eye: Secondary | ICD-10-CM | POA: Diagnosis not present

## 2023-06-28 DIAGNOSIS — H524 Presbyopia: Secondary | ICD-10-CM | POA: Diagnosis not present

## 2023-06-28 DIAGNOSIS — H2512 Age-related nuclear cataract, left eye: Secondary | ICD-10-CM | POA: Diagnosis not present

## 2023-06-28 DIAGNOSIS — H43813 Vitreous degeneration, bilateral: Secondary | ICD-10-CM | POA: Diagnosis not present

## 2023-06-28 DIAGNOSIS — H52203 Unspecified astigmatism, bilateral: Secondary | ICD-10-CM | POA: Diagnosis not present

## 2023-06-30 ENCOUNTER — Encounter: Payer: Self-pay | Admitting: Internal Medicine

## 2023-07-24 ENCOUNTER — Ambulatory Visit

## 2023-07-24 VITALS — Ht 66.0 in | Wt 162.0 lb

## 2023-07-24 DIAGNOSIS — Z0000 Encounter for general adult medical examination without abnormal findings: Secondary | ICD-10-CM | POA: Diagnosis not present

## 2023-07-24 DIAGNOSIS — Z780 Asymptomatic menopausal state: Secondary | ICD-10-CM
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
## 2023-07-24 NOTE — Patient Instructions (Signed)
 Tammy Stevens , Thank you for taking time out of your busy schedule to complete your Annual Wellness Visit with me. I enjoyed our conversation and look forward to speaking with you again next year. I, as well as your care team,  appreciate your ongoing commitment to your health goals. Please review the following plan we discussed and let me know if I can assist you in the future. Your Game plan/ To Do List    Referrals: If you haven't heard from the office you've been referred to, please reach out to them at the phone provided.  Ordered a DEXA (bone) scan at [facility]. Follow up Visits: Next Medicare AWV with our clinical staff: 07/30/2024   Have you seen your provider in the last 6 months (3 months if uncontrolled diabetes)? Yes Next Office Visit with your provider: 08/01/2023  Clinician Recommendations:  Aim for 30 minutes of exercise or brisk walking, 6-8 glasses of water, and 5 servings of fruits and vegetables each day. Educated and advised on getting the COVID and Tdap (Tetenus) vaccines in 2025.      This is a list of the screening recommended for you and due dates:  Health Maintenance  Topic Date Due   DEXA scan (bone density measurement)  07/06/2013   Mammogram  09/11/2014   Colon Cancer Screening  05/14/2019   DTaP/Tdap/Td vaccine (3 - Tdap) 11/11/2019   COVID-19 Vaccine (5 - 2024-25 season) 11/06/2022   Flu Shot  10/06/2023   Medicare Annual Wellness Visit  07/23/2024   Pneumonia Vaccine  Completed   Hepatitis C Screening  Completed   Zoster (Shingles) Vaccine  Completed   HPV Vaccine  Aged Out   Meningitis B Vaccine  Aged Out    Advanced directives: (Provided) Advance directive discussed with you today. I have provided a copy for you to complete at home and have notarized. Once this is complete, please bring a copy in to our office so we can scan it into your chart.  Advance Care Planning is important because it:  [x]  Makes sure you receive the medical care that is  consistent with your values, goals, and preferences  [x]  It provides guidance to your family and loved ones and reduces their decisional burden about whether or not they are making the right decisions based on your wishes.  Follow the link provided in your after visit summary or read over the paperwork we have mailed to you to help you started getting your Advance Directives in place. If you need assistance in completing these, please reach out to us  so that we can help you!

## 2023-07-31 ENCOUNTER — Encounter: Payer: Self-pay | Admitting: Internal Medicine

## 2023-07-31 NOTE — Progress Notes (Unsigned)
    Subjective:    Patient ID: Tammy Stevens, female    DOB: December 16, 1945, 78 y.o.   MRN: [id number]      HPI Tammy Stevens is here for No chief complaint on file.    Intestinal issues -     Medications and allergies reviewed with patient and updated if appropriate.  Current Outpatient Medications on File Prior to Visit  Medication Sig Dispense Refill   amLODipine  (NORVASC ) 5 MG tablet TAKE 1 TABLET (5 MG TOTAL) BY MOUTH DAILY. 90 tablet 0   aspirin EC 81 MG tablet Take 81 mg by mouth daily.     gabapentin  (NEURONTIN ) 300 MG capsule Take 300 mg by mouth every evening. Pt takes 1 at night  1   lamoTRIgine  (LAMICTAL ) 200 MG tablet Take 400 mg by mouth at bedtime.  1   Multiple Vitamin (MULTIVITAMIN) tablet Take 1 tablet by mouth daily.     QUEtiapine  (SEROQUEL ) 300 MG tablet TAKE 1 TABLET BY MOUTH EVERY DAY IN THE MORNING AND 1 AT BEDTIME 180 tablet 4   VITAMIN D  PO Take by mouth.     No current facility-administered medications on file prior to visit.    Review of Systems     Objective:  There were no vitals filed for this visit. BP Readings from Last 3 Encounters:  03/23/23 125/72  12/12/22 110/66  12/03/21 (!) 144/82   Wt Readings from Last 3 Encounters:  07/24/23 162 lb (73.5 kg)  03/23/23 162 lb 3.2 oz (73.6 kg)  12/12/22 165 lb (74.8 kg)   There is no height or weight on file to calculate BMI.    Physical Exam         Assessment & Plan:    See Problem List for Assessment and Plan of chronic medical problems.

## 2023-08-01 ENCOUNTER — Ambulatory Visit (INDEPENDENT_AMBULATORY_CARE_PROVIDER_SITE_OTHER): Admitting: Internal Medicine

## 2023-08-01 VITALS — BP 134/70 | HR 65 | Temp 98.7°F | Ht 66.0 in | Wt 163.0 lb

## 2023-08-01 DIAGNOSIS — I10 Essential (primary) hypertension: Secondary | ICD-10-CM | POA: Diagnosis not present

## 2023-08-01 DIAGNOSIS — K5909 Other constipation: Secondary | ICD-10-CM | POA: Diagnosis not present

## 2023-08-01 MED ORDER — AMLODIPINE BESYLATE 5 MG PO TABS: 5.0000 mg | ORAL_TABLET | Freq: Every day | ORAL | 1 refills | Status: DC

## 2023-08-01 NOTE — Assessment & Plan Note (Signed)
 Acute on chronic Continue MiraLAX daily Continue good water intake and exercise Start stool softener daily-just dose if needed Start eating prunes daily-she likes prunes and this may help If she is not having regular bowel movements she will let me know and we can adjust further Also discussed that she can try smooth move tea, probiotics, magnesium glycinate

## 2023-08-01 NOTE — Assessment & Plan Note (Signed)
 Chronic Blood pressure controlled Continue amlodipine 5 mg daily

## 2023-08-01 NOTE — Patient Instructions (Addendum)
   For constipation  Stool softener Probiotics Metamucil fiber miralax - which you can take daily if needed Smooth move tea Prunes, prune juice, dates or raisons Magnesium glycinate supplement over the counter    Try adding in a stool softener daily and try eating prunes daily.       Return if symptoms worsen or fail to improve.

## 2023-10-24 DIAGNOSIS — F411 Generalized anxiety disorder: Secondary | ICD-10-CM | POA: Diagnosis not present

## 2023-10-24 DIAGNOSIS — F4312 Post-traumatic stress disorder, chronic: Secondary | ICD-10-CM | POA: Diagnosis not present

## 2023-10-24 DIAGNOSIS — F319 Bipolar disorder, unspecified: Secondary | ICD-10-CM | POA: Diagnosis not present

## 2023-12-15 ENCOUNTER — Telehealth: Payer: Self-pay

## 2023-12-15 NOTE — Telephone Encounter (Signed)
 We do not have any opportunities in our clinic.  There may be opportunities another clinic, but she would have to go through [facility] because she has to be certified in order to even shadow.

## 2023-12-15 NOTE — Telephone Encounter (Signed)
 Copied from CRM [id number]. Topic: General - Other >> Dec 15, 2023  1:08 PM Pinkey ORN wrote: Reason for CRM: Requesting Office Call Back >> Dec 15, 2023  1:10 PM Pinkey ORN wrote: Patient is calling on behalf of her granddaughter, wanting to know if there's premed shadowing opportunities available at the clinic. Please follow up with patient.

## 2023-12-29 ENCOUNTER — Encounter: Payer: Self-pay | Admitting: Internal Medicine

## 2024-03-15 ENCOUNTER — Other Ambulatory Visit: Payer: Self-pay | Admitting: Internal Medicine

## 2024-07-30 ENCOUNTER — Ambulatory Visit
